# Patient Record
Sex: Female | Born: 1950 | Race: White | Hispanic: No | Smoking: Never smoker
Health system: Southern US, Community
[De-identification: ages and names within clinical notes are randomized; demographics above are authoritative.]

## PROBLEM LIST (undated history)

## (undated) DIAGNOSIS — I10 Essential (primary) hypertension: Secondary | ICD-10-CM

## (undated) DIAGNOSIS — R001 Bradycardia, unspecified: Secondary | ICD-10-CM

## (undated) DIAGNOSIS — I441 Atrioventricular block, second degree: Secondary | ICD-10-CM

## (undated) DIAGNOSIS — I428 Other cardiomyopathies: Secondary | ICD-10-CM

## (undated) DIAGNOSIS — K439 Ventral hernia without obstruction or gangrene: Secondary | ICD-10-CM

## (undated) DIAGNOSIS — G510 Bell's palsy: Secondary | ICD-10-CM

## (undated) DIAGNOSIS — E119 Type 2 diabetes mellitus without complications: Secondary | ICD-10-CM

## (undated) DIAGNOSIS — I2510 Atherosclerotic heart disease of native coronary artery without angina pectoris: Secondary | ICD-10-CM

## (undated) DIAGNOSIS — I5042 Chronic combined systolic (congestive) and diastolic (congestive) heart failure: Secondary | ICD-10-CM

## (undated) HISTORY — DX: Other cardiomyopathies: I42.8

## (undated) HISTORY — DX: Essential (primary) hypertension: I10

## (undated) HISTORY — PX: PACEMAKER INSERTION: SHX728

## (undated) HISTORY — DX: Bell's palsy: G51.0

## (undated) HISTORY — DX: Atherosclerotic heart disease of native coronary artery without angina pectoris: I25.10

## (undated) HISTORY — PX: INSERT / REPLACE / REMOVE PACEMAKER: SUR710

## (undated) HISTORY — PX: HERNIA REPAIR: SHX51

## (undated) HISTORY — DX: Chronic combined systolic (congestive) and diastolic (congestive) heart failure: I50.42

---

## 2006-02-11 ENCOUNTER — Emergency Department: Payer: Self-pay | Admitting: Unknown Physician Specialty

## 2006-02-16 ENCOUNTER — Emergency Department: Payer: Self-pay | Admitting: Emergency Medicine

## 2006-02-20 ENCOUNTER — Emergency Department: Payer: Self-pay | Admitting: Emergency Medicine

## 2013-07-29 ENCOUNTER — Emergency Department: Payer: Self-pay | Admitting: Emergency Medicine

## 2013-07-29 LAB — CBC
HCT: 42.3 % (ref 35.0–47.0)
HGB: 14.4 g/dL (ref 12.0–16.0)
MCH: 28.6 pg (ref 26.0–34.0)
MCHC: 34 g/dL (ref 32.0–36.0)
Platelet: 156 10*3/uL (ref 150–440)
RDW: 13.2 % (ref 11.5–14.5)
WBC: 9.8 10*3/uL (ref 3.6–11.0)

## 2013-07-29 LAB — URINALYSIS, COMPLETE
Bilirubin,UR: NEGATIVE
Leukocyte Esterase: NEGATIVE
Nitrite: NEGATIVE
RBC,UR: 12549 /HPF (ref 0–5)
Specific Gravity: 1.036 (ref 1.003–1.030)
WBC UR: 1371 /HPF (ref 0–5)

## 2013-07-29 LAB — BASIC METABOLIC PANEL
BUN: 13 mg/dL (ref 7–18)
Chloride: 103 mmol/L (ref 98–107)
Co2: 28 mmol/L (ref 21–32)
Creatinine: 0.45 mg/dL — ABNORMAL LOW (ref 0.60–1.30)
EGFR (African American): >60
EGFR (Non-African Amer.): >60
Glucose: 341 mg/dL — ABNORMAL HIGH (ref 65–99)
Osmolality: 286 (ref 275–301)
Potassium: 3.6 mmol/L (ref 3.5–5.1)
Sodium: 136 mmol/L (ref 136–145)

## 2013-07-31 LAB — URINE CULTURE

## 2013-11-11 ENCOUNTER — Ambulatory Visit: Payer: Self-pay | Admitting: Orthopedic Surgery

## 2014-02-24 LAB — CBC WITH DIFFERENTIAL/PLATELET
BASOS PCT: 0.3 %
Basophil #: 0 10*3/uL (ref 0.0–0.1)
EOS ABS: 0 10*3/uL (ref 0.0–0.7)
EOS PCT: 0.1 %
HCT: 48.2 % — AB (ref 35.0–47.0)
HGB: 15.6 g/dL (ref 12.0–16.0)
LYMPHS PCT: 4.8 %
Lymphocyte #: 0.7 10*3/uL — ABNORMAL LOW (ref 1.0–3.6)
MCH: 28.2 pg (ref 26.0–34.0)
MCHC: 32.3 g/dL (ref 32.0–36.0)
MCV: 87 fL (ref 80–100)
Monocyte #: 0.4 x10 3/mm (ref 0.2–0.9)
Monocyte %: 2.9 %
NEUTROS ABS: 14.3 10*3/uL — AB (ref 1.4–6.5)
Neutrophil %: 91.9 %
Platelet: 190 10*3/uL (ref 150–440)
RBC: 5.52 10*6/uL — ABNORMAL HIGH (ref 3.80–5.20)
RDW: 13.5 % (ref 11.5–14.5)
WBC: 15.5 10*3/uL — ABNORMAL HIGH (ref 3.6–11.0)

## 2014-02-24 LAB — COMPREHENSIVE METABOLIC PANEL
Albumin: 4.1 g/dL (ref 3.4–5.0)
Alkaline Phosphatase: 92 U/L
Anion Gap: 24 — ABNORMAL HIGH (ref 7–16)
BILIRUBIN TOTAL: 1.8 mg/dL — AB (ref 0.2–1.0)
BUN: 55 mg/dL — AB (ref 7–18)
CALCIUM: 9.3 mg/dL (ref 8.5–10.1)
Chloride: 88 mmol/L — ABNORMAL LOW (ref 98–107)
Co2: 21 mmol/L (ref 21–32)
Creatinine: 1.26 mg/dL (ref 0.60–1.30)
EGFR (African American): 53 — ABNORMAL LOW
EGFR (Non-African Amer.): 46 — ABNORMAL LOW
Glucose: 546 mg/dL (ref 65–99)
Osmolality: 306 (ref 275–301)
Potassium: 4.1 mmol/L (ref 3.5–5.1)
SGOT(AST): 34 U/L (ref 15–37)
SGPT (ALT): 21 U/L (ref 12–78)
SODIUM: 133 mmol/L — AB (ref 136–145)
Total Protein: 8.3 g/dL — ABNORMAL HIGH (ref 6.4–8.2)

## 2014-02-25 ENCOUNTER — Inpatient Hospital Stay: Payer: Self-pay | Admitting: Internal Medicine

## 2014-02-25 LAB — HEMOGLOBIN A1C: HEMOGLOBIN A1C: 9.7 % — AB (ref 4.2–6.3)

## 2014-02-25 LAB — BASIC METABOLIC PANEL
ANION GAP: 10 (ref 7–16)
ANION GAP: 7 (ref 7–16)
Anion Gap: 22 — ABNORMAL HIGH (ref 7–16)
BUN: 53 mg/dL — ABNORMAL HIGH (ref 7–18)
BUN: 51 mg/dL — AB (ref 7–18)
BUN: 48 mg/dL — AB (ref 7–18)
CALCIUM: 8.5 mg/dL (ref 8.5–10.1)
CALCIUM: 8.5 mg/dL (ref 8.5–10.1)
CHLORIDE: 95 mmol/L — AB (ref 98–107)
CHLORIDE: 103 mmol/L (ref 98–107)
CO2: 31 mmol/L (ref 21–32)
CREATININE: 1.23 mg/dL (ref 0.60–1.30)
CREATININE: 1.34 mg/dL — AB (ref 0.60–1.30)
CREATININE: 1.17 mg/dL (ref 0.60–1.30)
Calcium, Total: 8.1 mg/dL — ABNORMAL LOW (ref 8.5–10.1)
Chloride: 101 mmol/L (ref 98–107)
Co2: 24 mmol/L (ref 21–32)
Co2: 31 mmol/L (ref 21–32)
EGFR (African American): 49 — ABNORMAL LOW
EGFR (African American): 58 — ABNORMAL LOW
EGFR (Non-African Amer.): 50 — ABNORMAL LOW
GFR CALC AF AMER: 55 — AB
GFR CALC NON AF AMER: 47 — AB
GFR CALC NON AF AMER: 43 — AB
GLUCOSE: 255 mg/dL — AB (ref 65–99)
Glucose: 473 mg/dL — ABNORMAL HIGH (ref 65–99)
Glucose: 268 mg/dL — ABNORMAL HIGH (ref 65–99)
Osmolality: 316 (ref 275–301)
Osmolality: 310 (ref 275–301)
Osmolality: 299 (ref 275–301)
POTASSIUM: 2.7 mmol/L — AB (ref 3.5–5.1)
Potassium: 2.7 mmol/L — ABNORMAL LOW (ref 3.5–5.1)
Potassium: 2.5 mmol/L — CL (ref 3.5–5.1)
Sodium: 141 mmol/L (ref 136–145)
Sodium: 144 mmol/L (ref 136–145)
Sodium: 139 mmol/L (ref 136–145)

## 2014-02-25 LAB — POTASSIUM: POTASSIUM: 2.9 mmol/L — AB (ref 3.5–5.1)

## 2014-02-25 LAB — URINALYSIS, COMPLETE
Bilirubin,UR: NEGATIVE
Blood: NEGATIVE
Glucose,UR: >=500 mg/dL (ref 0–75)
Nitrite: NEGATIVE
PROTEIN: NEGATIVE
Ph: 5 (ref 4.5–8.0)
RBC,UR: 8 /HPF (ref 0–5)
Specific Gravity: 1.021 (ref 1.003–1.030)
WBC UR: 196 /HPF (ref 0–5)

## 2014-02-26 LAB — BASIC METABOLIC PANEL
Anion Gap: 5 — ABNORMAL LOW (ref 7–16)
BUN: 36 mg/dL — AB (ref 7–18)
CO2: 33 mmol/L — AB (ref 21–32)
CREATININE: 0.71 mg/dL (ref 0.60–1.30)
Calcium, Total: 8.5 mg/dL (ref 8.5–10.1)
Chloride: 105 mmol/L (ref 98–107)
EGFR (African American): >60
EGFR (Non-African Amer.): >60
Glucose: 112 mg/dL — ABNORMAL HIGH (ref 65–99)
OSMOLALITY: 294 (ref 275–301)
Potassium: 3.6 mmol/L (ref 3.5–5.1)
Sodium: 143 mmol/L (ref 136–145)

## 2014-02-26 LAB — CBC WITH DIFFERENTIAL/PLATELET
Basophil #: 0.1 10*3/uL (ref 0.0–0.1)
Basophil %: 0.5 %
Eosinophil #: 0 10*3/uL (ref 0.0–0.7)
Eosinophil %: 0 %
HCT: 44.7 % (ref 35.0–47.0)
HGB: 14.8 g/dL (ref 12.0–16.0)
LYMPHS ABS: 1.1 10*3/uL (ref 1.0–3.6)
Lymphocyte %: 8.4 %
MCH: 28.5 pg (ref 26.0–34.0)
MCHC: 33.2 g/dL (ref 32.0–36.0)
MCV: 86 fL (ref 80–100)
MONO ABS: 0.8 x10 3/mm (ref 0.2–0.9)
MONOS PCT: 6.6 %
NEUTROS ABS: 10.7 10*3/uL — AB (ref 1.4–6.5)
NEUTROS PCT: 84.5 %
PLATELETS: 135 10*3/uL — AB (ref 150–440)
RBC: 5.2 10*6/uL (ref 3.80–5.20)
RDW: 13.3 % (ref 11.5–14.5)
WBC: 12.7 10*3/uL — ABNORMAL HIGH (ref 3.6–11.0)

## 2014-02-26 LAB — URINE CULTURE

## 2014-02-27 LAB — BASIC METABOLIC PANEL
Anion Gap: 9 (ref 7–16)
BUN: 31 mg/dL — AB (ref 7–18)
CALCIUM: 8.4 mg/dL — AB (ref 8.5–10.1)
Chloride: 104 mmol/L (ref 98–107)
Co2: 32 mmol/L (ref 21–32)
Creatinine: 0.64 mg/dL (ref 0.60–1.30)
EGFR (African American): >60
EGFR (Non-African Amer.): >60
GLUCOSE: 129 mg/dL — AB (ref 65–99)
OSMOLALITY: 297 (ref 275–301)
Potassium: 3.4 mmol/L — ABNORMAL LOW (ref 3.5–5.1)
Sodium: 145 mmol/L (ref 136–145)

## 2014-02-27 LAB — CBC WITH DIFFERENTIAL/PLATELET
BASOS ABS: 0 10*3/uL (ref 0.0–0.1)
BASOS PCT: 0.2 %
EOS ABS: 0 10*3/uL (ref 0.0–0.7)
EOS PCT: 0 %
HCT: 49.5 % — ABNORMAL HIGH (ref 35.0–47.0)
HGB: 16.3 g/dL — AB (ref 12.0–16.0)
Lymphocyte #: 0.6 10*3/uL — ABNORMAL LOW (ref 1.0–3.6)
Lymphocyte %: 5.8 %
MCH: 28.5 pg (ref 26.0–34.0)
MCHC: 32.9 g/dL (ref 32.0–36.0)
MCV: 87 fL (ref 80–100)
Monocyte #: 0.7 x10 3/mm (ref 0.2–0.9)
Monocyte %: 6.9 %
Neutrophil #: 8.2 10*3/uL — ABNORMAL HIGH (ref 1.4–6.5)
Neutrophil %: 87.1 %
PLATELETS: 124 10*3/uL — AB (ref 150–440)
RBC: 5.69 10*6/uL — ABNORMAL HIGH (ref 3.80–5.20)
RDW: 13.4 % (ref 11.5–14.5)
WBC: 9.4 10*3/uL (ref 3.6–11.0)

## 2014-02-28 LAB — COMPREHENSIVE METABOLIC PANEL
ALBUMIN: 2.3 g/dL — AB (ref 3.4–5.0)
ALK PHOS: 48 U/L
Anion Gap: 8 (ref 7–16)
BUN: 34 mg/dL — ABNORMAL HIGH (ref 7–18)
Bilirubin,Total: 0.8 mg/dL (ref 0.2–1.0)
Calcium, Total: 7.7 mg/dL — ABNORMAL LOW (ref 8.5–10.1)
Chloride: 104 mmol/L (ref 98–107)
Co2: 34 mmol/L — ABNORMAL HIGH (ref 21–32)
Creatinine: 0.65 mg/dL (ref 0.60–1.30)
EGFR (Non-African Amer.): >60
Glucose: 172 mg/dL — ABNORMAL HIGH (ref 65–99)
Osmolality: 302 (ref 275–301)
Potassium: 3.5 mmol/L (ref 3.5–5.1)
SGOT(AST): 122 U/L — ABNORMAL HIGH (ref 15–37)
SGPT (ALT): 121 U/L — ABNORMAL HIGH (ref 12–78)
Sodium: 146 mmol/L — ABNORMAL HIGH (ref 136–145)
TOTAL PROTEIN: 5 g/dL — AB (ref 6.4–8.2)

## 2014-02-28 LAB — CBC WITH DIFFERENTIAL/PLATELET
BASOS ABS: 0 10*3/uL (ref 0.0–0.1)
Basophil %: 0.1 %
EOS ABS: 0 10*3/uL (ref 0.0–0.7)
Eosinophil %: 0 %
HCT: 43.3 % (ref 35.0–47.0)
HGB: 14 g/dL (ref 12.0–16.0)
Lymphocyte #: 0.5 10*3/uL — ABNORMAL LOW (ref 1.0–3.6)
Lymphocyte %: 5.1 %
MCH: 28.6 pg (ref 26.0–34.0)
MCHC: 32.4 g/dL (ref 32.0–36.0)
MCV: 88 fL (ref 80–100)
Monocyte #: 0.6 x10 3/mm (ref 0.2–0.9)
Monocyte %: 6.3 %
Neutrophil #: 8.5 10*3/uL — ABNORMAL HIGH (ref 1.4–6.5)
Neutrophil %: 88.5 %
PLATELETS: 122 10*3/uL — AB (ref 150–440)
RBC: 4.9 10*6/uL (ref 3.80–5.20)
RDW: 13.2 % (ref 11.5–14.5)
WBC: 9.7 10*3/uL (ref 3.6–11.0)

## 2014-03-01 LAB — CBC WITH DIFFERENTIAL/PLATELET
BASOS ABS: 0 10*3/uL (ref 0.0–0.1)
BASOS PCT: 0.1 %
EOS ABS: 0 10*3/uL (ref 0.0–0.7)
EOS PCT: 0 %
HCT: 41.2 % (ref 35.0–47.0)
HGB: 13.2 g/dL (ref 12.0–16.0)
LYMPHS ABS: 0.7 10*3/uL — AB (ref 1.0–3.6)
LYMPHS PCT: 6.3 %
MCH: 28.6 pg (ref 26.0–34.0)
MCHC: 32.1 g/dL (ref 32.0–36.0)
MCV: 89 fL (ref 80–100)
MONOS PCT: 6.3 %
Monocyte #: 0.7 x10 3/mm (ref 0.2–0.9)
NEUTROS ABS: 9.3 10*3/uL — AB (ref 1.4–6.5)
Neutrophil %: 87.3 %
Platelet: 124 10*3/uL — ABNORMAL LOW (ref 150–440)
RBC: 4.62 10*6/uL (ref 3.80–5.20)
RDW: 13.7 % (ref 11.5–14.5)
WBC: 10.7 10*3/uL (ref 3.6–11.0)

## 2014-03-01 LAB — BASIC METABOLIC PANEL
Anion Gap: 10 (ref 7–16)
BUN: 41 mg/dL — AB (ref 7–18)
CALCIUM: 7.9 mg/dL — AB (ref 8.5–10.1)
Chloride: 105 mmol/L (ref 98–107)
Co2: 28 mmol/L (ref 21–32)
Creatinine: 0.7 mg/dL (ref 0.60–1.30)
EGFR (Non-African Amer.): >60
Glucose: 144 mg/dL — ABNORMAL HIGH (ref 65–99)
Osmolality: 298 (ref 275–301)
Potassium: 3.6 mmol/L (ref 3.5–5.1)
Sodium: 143 mmol/L (ref 136–145)

## 2014-03-02 LAB — CBC WITH DIFFERENTIAL/PLATELET
BASOS ABS: 0 10*3/uL (ref 0.0–0.1)
BASOS PCT: 0.5 %
EOS ABS: 0 10*3/uL (ref 0.0–0.7)
Eosinophil %: 0 %
HCT: 36 % (ref 35.0–47.0)
HGB: 11.9 g/dL — ABNORMAL LOW (ref 12.0–16.0)
LYMPHS ABS: 0.7 10*3/uL — AB (ref 1.0–3.6)
LYMPHS PCT: 9 %
MCH: 28.9 pg (ref 26.0–34.0)
MCHC: 33 g/dL (ref 32.0–36.0)
MCV: 88 fL (ref 80–100)
MONO ABS: 0.8 x10 3/mm (ref 0.2–0.9)
Monocyte %: 10.7 %
Neutrophil #: 5.9 10*3/uL (ref 1.4–6.5)
Neutrophil %: 79.8 %
Platelet: 120 10*3/uL — ABNORMAL LOW (ref 150–440)
RBC: 4.11 10*6/uL (ref 3.80–5.20)
RDW: 13.4 % (ref 11.5–14.5)
WBC: 7.4 10*3/uL (ref 3.6–11.0)

## 2014-03-02 LAB — BASIC METABOLIC PANEL
ANION GAP: 6 — AB (ref 7–16)
BUN: 23 mg/dL — ABNORMAL HIGH (ref 7–18)
CREATININE: 0.56 mg/dL — AB (ref 0.60–1.30)
Calcium, Total: 7.8 mg/dL — ABNORMAL LOW (ref 8.5–10.1)
Chloride: 107 mmol/L (ref 98–107)
Co2: 30 mmol/L (ref 21–32)
EGFR (African American): >60
EGFR (Non-African Amer.): >60
GLUCOSE: 197 mg/dL — AB (ref 65–99)
Osmolality: 294 (ref 275–301)
POTASSIUM: 3.2 mmol/L — AB (ref 3.5–5.1)
SODIUM: 143 mmol/L (ref 136–145)

## 2014-03-02 LAB — PHOSPHORUS: PHOSPHORUS: 1.2 mg/dL — AB (ref 2.5–4.9)

## 2014-03-02 LAB — MAGNESIUM: Magnesium: 2 mg/dL

## 2014-03-03 LAB — BASIC METABOLIC PANEL
Anion Gap: 5 — ABNORMAL LOW (ref 7–16)
BUN: 12 mg/dL (ref 7–18)
CALCIUM: 7.3 mg/dL — AB (ref 8.5–10.1)
CREATININE: 0.49 mg/dL — AB (ref 0.60–1.30)
Chloride: 104 mmol/L (ref 98–107)
Co2: 31 mmol/L (ref 21–32)
EGFR (Non-African Amer.): >60
Glucose: 227 mg/dL — ABNORMAL HIGH (ref 65–99)
Osmolality: 286 (ref 275–301)
Potassium: 3.1 mmol/L — ABNORMAL LOW (ref 3.5–5.1)
SODIUM: 140 mmol/L (ref 136–145)

## 2014-03-03 LAB — CBC WITH DIFFERENTIAL/PLATELET
BASOS PCT: 0.1 %
Basophil #: 0 10*3/uL (ref 0.0–0.1)
Eosinophil #: 0 10*3/uL (ref 0.0–0.7)
Eosinophil %: 0 %
HCT: 34.5 % — AB (ref 35.0–47.0)
HGB: 11.8 g/dL — ABNORMAL LOW (ref 12.0–16.0)
LYMPHS ABS: 0.9 10*3/uL — AB (ref 1.0–3.6)
Lymphocyte %: 13.3 %
MCH: 29.4 pg (ref 26.0–34.0)
MCHC: 34.2 g/dL (ref 32.0–36.0)
MCV: 86 fL (ref 80–100)
Monocyte #: 0.8 x10 3/mm (ref 0.2–0.9)
Monocyte %: 11.9 %
NEUTROS ABS: 5 10*3/uL (ref 1.4–6.5)
NEUTROS PCT: 74.7 %
Platelet: 128 10*3/uL — ABNORMAL LOW (ref 150–440)
RBC: 4.02 10*6/uL (ref 3.80–5.20)
RDW: 13.1 % (ref 11.5–14.5)
WBC: 6.7 10*3/uL (ref 3.6–11.0)

## 2014-03-03 LAB — PATHOLOGY REPORT

## 2014-03-04 LAB — BASIC METABOLIC PANEL
ANION GAP: 5 — AB (ref 7–16)
BUN: 8 mg/dL (ref 7–18)
Calcium, Total: 7.6 mg/dL — ABNORMAL LOW (ref 8.5–10.1)
Chloride: 98 mmol/L (ref 98–107)
Co2: 34 mmol/L — ABNORMAL HIGH (ref 21–32)
Creatinine: 0.33 mg/dL — ABNORMAL LOW (ref 0.60–1.30)
EGFR (Non-African Amer.): >60
Glucose: 157 mg/dL — ABNORMAL HIGH (ref 65–99)
Osmolality: 275 (ref 275–301)
POTASSIUM: 2.9 mmol/L — AB (ref 3.5–5.1)
Sodium: 137 mmol/L (ref 136–145)

## 2014-03-04 LAB — PHOSPHORUS: PHOSPHORUS: 2 mg/dL — AB (ref 2.5–4.9)

## 2014-03-04 LAB — MAGNESIUM: Magnesium: 1.4 mg/dL — ABNORMAL LOW

## 2014-03-05 LAB — BASIC METABOLIC PANEL
Anion Gap: 6 — ABNORMAL LOW (ref 7–16)
BUN: 7 mg/dL (ref 7–18)
CHLORIDE: 99 mmol/L (ref 98–107)
CO2: 31 mmol/L (ref 21–32)
CREATININE: 0.37 mg/dL — AB (ref 0.60–1.30)
Calcium, Total: 7.6 mg/dL — ABNORMAL LOW (ref 8.5–10.1)
EGFR (Non-African Amer.): >60
GLUCOSE: 197 mg/dL — AB (ref 65–99)
OSMOLALITY: 275 (ref 275–301)
POTASSIUM: 3.6 mmol/L (ref 3.5–5.1)
SODIUM: 136 mmol/L (ref 136–145)

## 2014-03-05 LAB — MAGNESIUM: MAGNESIUM: 1.9 mg/dL

## 2014-03-06 LAB — PLATELET COUNT: PLATELETS: 205 10*3/uL (ref 150–440)

## 2014-06-30 ENCOUNTER — Emergency Department: Payer: Self-pay | Admitting: Emergency Medicine

## 2014-06-30 LAB — CBC
HCT: 38.2 % (ref 35.0–47.0)
HGB: 12.5 g/dL (ref 12.0–16.0)
MCH: 27.1 pg (ref 26.0–34.0)
MCHC: 32.6 g/dL (ref 32.0–36.0)
MCV: 83 fL (ref 80–100)
Platelet: 184 10*3/uL (ref 150–440)
RBC: 4.61 10*6/uL (ref 3.80–5.20)
RDW: 13 % (ref 11.5–14.5)
WBC: 8 10*3/uL (ref 3.6–11.0)

## 2014-06-30 LAB — BASIC METABOLIC PANEL
Anion Gap: 7 (ref 7–16)
BUN: 20 mg/dL — ABNORMAL HIGH (ref 7–18)
CALCIUM: 8.6 mg/dL (ref 8.5–10.1)
Chloride: 101 mmol/L (ref 98–107)
Co2: 28 mmol/L (ref 21–32)
Creatinine: 0.74 mg/dL (ref 0.60–1.30)
EGFR (African American): >60
EGFR (Non-African Amer.): >60
Glucose: 453 mg/dL — ABNORMAL HIGH (ref 65–99)
Osmolality: 294 (ref 275–301)
Potassium: 3.9 mmol/L (ref 3.5–5.1)
Sodium: 136 mmol/L (ref 136–145)

## 2014-12-04 NOTE — Consult Note (Signed)
PATIENT NAME:  Bethany Gillespie, Bethany Gillespie MR#:  [id number] DATE OF BIRTH:  [birth year]  DATE OF CONSULTATION:  02/25/2014  REFERRING PHYSICIAN:   CONSULTING PHYSICIAN:  Lollie Sails, MD  A patient of Dr. Dustin Flock.  REASON FOR CONSULTATION: Blood in the emesis.   HISTORY OF PRESENT ILLNESS: [name] is a 64 year old Caucasian female, who was admitted on July 16 for nausea and vomiting. She was found to have a hyperosmolar nonketotic state with her associated diabetes. She states that her problems began on Tuesday with some diarrhea. This was then followed with nausea and vomiting. She came to the Emergency Room on Wednesday night. She states that the diarrhea has decreased and improved; however, whenever she tries to eat or drink anything, she has been throwing up, this being brownish/coffee-ground watery-colored material. She states that she has been using insulin for at least 2 years. She denies any history of peptic ulcer disease or history of nonsteroidal anti-inflammatory medication use. She takes no prescriptions for pain. She has never had a colonoscopy. Her hemoglobin has been stable during the hospitalization, indeed increasing. Her BUN has been improving. She has had weight loss for at least several months.   PAST MEDICAL HISTORY: Type 2 insulin requiring diabetes mellitus and hypertension. She denies any abdominal surgery.   REVIEW OF SYSTEMS: 10 systems reviewed per admission history and physical, agree with same.   PHYSICAL EXAMINATION:  VITAL SIGNS: Temperature 97.6, pulse 130 to 120, respirations 18, blood pressure 119/86, pulse oximetry 94%.  GENERAL: She is a 64 year old, Caucasian female. She is spitting small amounts of brown liquid material into an emesis bag during the interview.  HEENT:  Normocephalic, atraumatic. Eyes are anicteric.  HEART: Regular rate and rhythm.  LUNGS: Clear.  ABDOMEN: Distended in the upper abdomen with a firm masslike region in the left upper  quadrant. She is tender to palpation in this area. The lower abdomen is minimally tender, but nondistended. Bowel sounds are positive, but distant. There is no rebound.  EXTREMITIES: No clubbing, cyanosis, or edema.  NEUROLOGICAL: Cranial nerves II through XII grossly intact. Muscle strength bilaterally equal and symmetric. RECTAL: Anorectal examination deferred.   LABORATORY DATA: This morning's laboratories include a glucose of 129, BUN 31, creatinine 0.64, sodium 145, potassium 3.4, chloride 104, bicarbonate 32, calcium 8.4. Hemogram showing a white count of 9.4, hemoglobin and hematocrit of 16.3/49.5, platelet count of 124,000.   She did have an imaging study this afternoon, this being a CT scan of the abdomen and pelvis with contrast for abdominal pain, nausea, vomiting, and leukocytosis. This showed a marked gastric distention due to a large hiatal hernia with postbulbar duodenal obstruction at the diaphragmatic hiatus. Gastric volvulus could not be excluded and a general surgical consultation was recommended. There is a 1.6 cm indeterminate left adrenal mass. Small bibasilar pleural effusion and bibasilar atelectasis.   ASSESSMENT: Nausea and vomiting, coffee-ground emesis. CT scan and physical examination concerning for a gastric volvulus with duodenal obstruction, and much of the stomach above the diaphragmatic hiatus.   RECOMMENDATIONS: Agree with surgical consultation as recommended in radiology/CT report.   We will discuss further with primary doctor.   ____________________________ Lollie Sails, MD mus:jr D: 02/27/2014 16:33:21 ET T: 02/27/2014 16:55:08 ET JOB#: [id number]  cc: Lollie Sails, MD, <Dictator> Lollie Sails MD ELECTRONICALLY SIGNED 03/11/2014 17:45

## 2014-12-04 NOTE — Op Note (Signed)
PATIENT NAME:  Bethany Gillespie, Bethany Gillespie MR#:  [id number] DATE OF BIRTH:  1950/12/05  DATE OF PROCEDURE:  02/27/2014  PREOPERATIVE DIAGNOSIS: Incarcerated paraesophageal hernia.   POSTOPERATIVE DIAGNOSIS: Incarcerated paraesophageal hernia.  PROCEDURE PERFORMED: 1.  Open paraesophageal hernia repair with biological mesh including Nissen fundoplication and insertion of gastrojejunostomy.  2.  Central line placement, right subclavian vein.   SURGEON: Hanad Leino A. Marina Gravel, MD FACS  ASSISTANT: Consuela Mimes, MD FACS  TYPE OF ANESTHESIA: General endotracheal.   FINDINGS: Nearly half the stomach was incarcerated in a paraesophageal hernia with necrotic and ischemic epiploic fat on the stomach greater curvature.   SPECIMENS: Fat as described above.   ESTIMATED BLOOD LOSS: Minimal.   DRAINS: GJ tube to gravity.   DESCRIPTION OF PROCEDURE: With informed consent in the supine position, general oral endotracheal anesthesia was induced. The patient's abdomen was widely clipped of hair, prepped and draped utilizing ChloraPrep solution. Timeout was observed. A midline skin incision was fashioned from the xiphoid to just above the umbilicus. A self-retaining abdominal retractor was placed. The falciform ligament was divided with the LigaSure apparatus. The left triangular hepatic ligament was taken down with electrocautery. The stomach was reduced with traction caudally with Babcock clamps. A large amount of gastric contents was drained via an orogastric tube placed by anesthesia.   The overlying hernia sac along the phrenoesophageal ligament was divided utilizing electrocautery. The left and right crura were skeletonized. Utilizing [employer] from [place], a reinforced repair was utilized. Prior to this, a 60-French bougie catheter was placed by anesthesia down into the stomach. The crural repair was accomplished with interrupted mattress-type sutures of #2-0 Prolene sewn overlying individual strips of Bio-A  material along both the right and left crura. Short gastrics were then divided utilizing electrocautery and oversewn with 2-0 silk suture. A window was fashioned behind the gastroesophageal junction which lay within the abdominal cavity at this point, and a Nissen fundoplication was accomplished utilizing #0 silk suture x 3. Two of the bites incorporated a small amount of serosa of the esophagus at the gastro-esophageal junction.   With this being accomplished, the gastrojejunostomy feeding tube was inserted in the standard stab fashion with concentric pursestring sutures of #0 silk suture.  Site was chosen on the anterior abdominal wall to accomplish gastropexy. Tube was inserted through the anterior abdominal wall and directed into the stomach with the G portion directed into the proximal jejunum through the pylorus and duodenum. Balloon was inflated. The catheter was then secured to the anterior abdominal wall utilizing silk suture. The abdomen was then irrigated and aspirated dry and hemostasis appeared to be excellent on the operative field. The wound was then closed between the extremes of the wound with a running #1 PDS suture. Skin edges were reapproximated utilizing a skin stapler and the flange was connected to the skin utilizing nylon suture. It was placed into a gravity bag. Sterile dressing was applied. The patient was subsequently extubated and taken to the recovery room in stable and satisfactory condition.   At the beginning of the case, a right subclavian vein catheter was placed under sterile technique utilizing sterile Seldinger technique on first pass in the subclavian vein.    ____________________________ Jeannette How. Marina Gravel, MD mab:sk D: 02/28/2014 17:46:00 ET T: 02/28/2014 23:57:13 ET JOB#: [id number]  cc: Elta Guadeloupe A. Marina Gravel, MD, <Dictator> Ahlivia Salahuddin A Brigetta Beckstrom MD ELECTRONICALLY SIGNED 03/03/2014 11:09

## 2014-12-04 NOTE — Consult Note (Signed)
Chief Complaint:  Subjective/Chief Complaint seen for coffeeground emesis.  patietn went to surgery yesterday for incarcerated hiatal hernia, tolerated well.   VITAL SIGNS/ANCILLARY NOTES: **Vital Signs.:   19-Jul-15 12:29  Vital Signs Type Q 4hr  Temperature Temperature (F) 98.3  Celsius 36.8  Pulse Pulse 104  Respirations Respirations 17  Systolic BP Systolic BP 678  Diastolic BP (mmHg) Diastolic BP (mmHg) 75  Mean BP 90  Pulse Ox % Pulse Ox % 92  Pulse Ox Activity Level  At rest  Oxygen Delivery 2L   Brief Assessment:  Cardiac Regular   Respiratory occasional wheeze   Gastrointestinal details normal appropriately tender, positive but distant bowel sounds.  mild to moderate distension, surgical  wound dressed.   Lab Results: Hepatic:  19-Jul-15 04:27   Bilirubin, Total 0.8  Alkaline Phosphatase 48 (45-117 NOTE: New Reference Range 07/03/13)  SGPT (ALT)  121  SGOT (AST)  122  Total Protein, Serum  5.0  Albumin, Serum  2.3  Routine Chem:  19-Jul-15 04:27   Glucose, Serum  172  BUN  34  Creatinine (comp) 0.65  Sodium, Serum  146  Potassium, Serum 3.5  Chloride, Serum 104  CO2, Serum  34  Calcium (Total), Serum  7.7  Osmolality (calc) 302  eGFR (African American) >60  eGFR (Non-African American) >60 (eGFR values <84mL/min/1.73 m2 may be an indication of chronic kidney disease (CKD). Calculated eGFR is useful in patients with stable renal function. The eGFR calculation will not be reliable in acutely ill patients when serum creatinine is changing rapidly. It is not useful in  patients on dialysis. The eGFR calculation may not be applicable to patients at the low and high extremes of body sizes, pregnant women, and vegetarians.)  Result Comment LABS - This specimen was collected through an   - indwelling catheter or arterial line.  - A minimum of 25mls of blood was wasted prior    - to collecting the sample.  Interpret  - results with caution.  Result(s)  reported on 28 Feb 2014 at 04:46AM.  Anion Gap 8  Routine Hem:  19-Jul-15 04:27   WBC (CBC) 9.7  RBC (CBC) 4.90  Hemoglobin (CBC) 14.0  Hematocrit (CBC) 43.3  Platelet Count (CBC)  122  MCV 88  MCH 28.6  MCHC 32.4  RDW 13.2  Neutrophil % 88.5  Lymphocyte % 5.1  Monocyte % 6.3  Eosinophil % 0.0  Basophil % 0.1  Neutrophil #  8.5  Lymphocyte #  0.5  Monocyte # 0.6  Eosinophil # 0.0  Basophil # 0.0 (Result(s) reported on 28 Feb 2014 at 04:40AM.)   Assessment/Plan:  Assessment/Plan:  Assessment 1) IDDM2, admitted with hyperomolar/non-ketotic.  resolved 2) incarcerated hiatal hernia. s/p surgery with ex lap, repair hiatal hernia, g-tube central line. doing better.   Plan 1) will start iv ppi, change to enteral when possible. continue as o/p.  will follow at a distance.   Electronic Signatures: Loistine Simas (MD)  (Signed 19-Jul-15 15:17)  Authored: Chief Complaint, VITAL SIGNS/ANCILLARY NOTES, Brief Assessment, Lab Results, Assessment/Plan   Last Updated: 19-Jul-15 15:17 by Loistine Simas (MD)

## 2014-12-04 NOTE — Discharge Summary (Signed)
PATIENT NAME:  Bethany Gillespie, Bethany Gillespie MR#:  [id number] DATE OF BIRTH:  [birth year]  DATE OF ADMISSION:  02/25/2014 DATE OF DISCHARGE:  03/08/2014  DISCHARGE DIAGNOSES:  1.  Incarcerated hiatal hernia status post exploratory laparoscopy, gastrostomy tube placement.  2.  Hyperosmolar nonketotic state. 3.  History of type 2 diabetes mellitus. 4.  Hypokalemia.  5.  Generalized deconditioning.   DISCHARGE MEDICATIONS:  1.  Lisinopril 20 mg p.o. daily. 2.  Lantus 18 units at bedtime.  3.   augmentin 1 tablet p.o. b.i.d. for 14 days.   CONSULTATIONS: Surgical consult with Dr. Marina Gravel and endocrinology consult with Dr. Lavone Orn. Physical therapy consult. The patient also seen by GI.   HOSPITAL COURSE: The patient is a 64 year old white female hospitalized on the 16th because of nausea, vomiting, diarrhea, and the patient noted to have elevated blood sugars and the worst 546, and she was started on insulin drip for hyperosmolar nonketotic state along with IV fluids. The patient continued to have nausea, vomiting, and underwent a CAT scan of the abdomen and pelvis which showed a large hiatal hernia with post bulbar duodenal obstruction. The patient was seen by surgery, and he had exploratory laparotomy and repair of hiatal hernia on July 18 and G tube was placed. The patient was continued on G tube feedings for a while and then started on a clear liquid diet on Saturday, and advanced that diet slowly. Today, she is on a solid diet and G tube feedings were stopped, and the patient was seen by Dr. Leanora Cover. Her incision area was examined and there was some pus around the incision area. So, that was drained and Dr. Leanora Cover started her on Augmentin, and patient will go home with Augmentin 1 tablet twice daily for 2 weeks. The patient will see Dr. Leanora Cover for a followup of abdominal wall abscess in 2 to 3 days at [facility] surgical. Patient discharged to home with home health, physical therapy, and RN. The patient right  now is on solid diet and G-tube feedings were stopped.   1.  She will follow up with [facility] surgical group regarding abdominal wall abscess and she needs to continue antibiotics.  2.  Type 2 diabetes mellitus. Patient's sugars started to go up and still on the Levemir. She can continue that.  3.  Deconditioning. The patient seen by physical therapy. They recommended rehabilitation, but she does not want to go. She wants to go home with home health, so will arrange that.  4.  Regarding the abdominal wall abscess, the patient's G-tube balloon had burst and caused some abdominal abscess, and so that area of the skin was drained and patient had a 16-French Foley placed and was started on Augmentin, and Dr. Leanora Cover will see her in the clinic. Every  other staple was removed.    Patient is afebrile today. Temperature 99.4, blood pressure 140/71, saturations 93% on room air. The patient's electrolytes were normal on the 24th and white count was to 6.7 on July 22nd.   Time spent on discharge was more than 30 minutes.  PRIMARY DOCTOR:  The patient has no primary doctor.   ____________________________ Epifanio Lesches, MD sk:am D: 03/08/2014 13:12:31 ET T: 03/08/2014 23:25:01 ET JOB#: [id number]  cc: Epifanio Lesches, MD, <Dictator> A. Lavone Orn, MD Lollie Sails, MD Consuela Mimes, MD   Epifanio Lesches MD ELECTRONICALLY SIGNED 03/25/2014 23:06

## 2014-12-04 NOTE — Consult Note (Signed)
Brief Consult Note: Diagnosis: nausea vomiting coffeeground emesis.   Patient was seen by consultant.   Consult note dictated.   Recommend further assessment or treatment.   Comments: Please see full GI consult [phone].  Patient admitted 2 d ago with hyperosmolar non-ketotic state in the setting of IDDM2.  Paitent continued with n/v after resolution of above and resolution of diarrhea.  Physicla exam shows a mass-like appearance to the left uper quadrant, paucity of normal bowel sounds.  CT showing concern for possible gastric volvulus, intrathoracic portion of stomach and point of possible obstruction in the duodenum.  Recommend surgical consultation.  Electronic Signatures for Addendum Section:  Loistine Simas (MD) (Signed Addendum 18-Jul-15 17:35)  Case discussed with Dr Marina Gravel and Dr Serita Grit.   Electronic Signatures: Loistine Simas (MD)  (Signed 18-Jul-15 16:38)  Authored: Brief Consult Note   Last Updated: 18-Jul-15 17:35 by Loistine Simas (MD)

## 2014-12-04 NOTE — Consult Note (Signed)
Brief Consult Note: Diagnosis: obstructed stomach, massive hiatal hernia.   Patient was seen by consultant.   Recommend to proceed with surgery or procedure.   Orders entered.   Discussed with Attending MD.   Comments: long standing issues with nausea, emesis, odynophagia. needs repair following ngt insertion, will need G-J tube as pexy in addition.  Electronic Signatures: Sherri Rad (MD)  (Signed 18-Jul-15 17:51)  Authored: Brief Consult Note   Last Updated: 18-Jul-15 17:51 by Sherri Rad (MD)

## 2014-12-04 NOTE — H&P (Signed)
PATIENT NAME:  Bethany Gillespie, Bethany Gillespie MR#:  [id number] DATE OF BIRTH:  [birth year]  DATE OF ADMISSION:  02/25/2014  REFERRING PHYSICIAN: Dr. Kerman Passey.   PRIMARY CARE PHYSICIAN: Dr. Dear.    CHIEF COMPLAINT: Nausea and vomiting.   HISTORY OF PRESENT ILLNESS: A 64 year old Caucasian female with history of type 2 diabetes insulin requiring, as well as hypertension, presenting with nausea and vomiting. She describes 3 day duration of nausea, vomiting and diarrhea, nonbloody, nonbilious emesis, watery stools. No fever or chills. No abdominal pain. Denies any sick contacts. Has not taken her insulin for the last 3 days' duration. Now has polyuria, polydipsia. Progressive weakness and fatigue. On arrival, glucose noted to be in the 500s.   REVIEW OF SYSTEMS:   CONSTITUTIONAL: Denies fever; however, positive for fatigue, weakness.  EYES: Denies blurry vision, double vision, eye pain.  EARS, NOSE, THROAT: Denies tinnitus, ear pain, hearing loss.  RESPIRATORY: Denies cough, wheeze, shortness of breath.  CARDIOVASCULAR: Denies chest pain, palpitations, edema.  GASTROINTESTINAL: Positive for nausea, vomiting, diarrhea as described above. Denies any abdominal pain.  GENITOURINARY: Positive for increased urinary frequency. Denies dysuria.  ENDOCRINE: Denies nocturia; however, positive for polydipsia.  HEMATOLOGIC AND LYMPHATIC: Denies easy bruising, bleeding.  SKIN: Denies rash or lesions.  MUSCULOSKELETAL: Denies pain in neck, back, shoulder, knees, hips or arthritic symptoms.  NEUROLOGIC: Denies paralysis, paresthesias.  PSYCHIATRIC: Denies anxiety or depressive symptoms.   Otherwise, full review of systems performed by me is negative.   PAST MEDICAL HISTORY: Type 2 diabetes insulin requiring, hypertension.   SOCIAL HISTORY: Denies alcohol, tobacco or drug usage.   FAMILY HISTORY: Denies any known cardiovascular or pulmonary disorders.   ALLERGIES: BEE STINGS.   HOME MEDICATIONS: Include Levemir  30 units subcutaneous at bedtime, as well as metformin unknown dose b.i.d.,   PHYSICAL EXAMINATION:  VITAL SIGNS: Temperature 98.6, heart rate 123, respirations 18, blood pressure 133/75, saturating 96% on room air. Weight 49 kg, BMI 19.8.  GENERAL: Ill-appearing Caucasian female in minimal distress given nauseousness, vomiting.  HEAD: Normocephalic, atraumatic.  EYES: Pupils equal, round and reactive to light. Extraocular muscles intact. No scleral icterus.  MOUTH: Markedly dry mucosal membranes. Dentition intact. No abscess noted.  EARS, NOSE, THROAT: Clear without exudates. No external lesions.  NECK: Supple. No thyromegaly. No nodules. No JVD.  PULMONARY: Clear to auscultation bilaterally without wheezes, rubs or rhonchi. No use of accessory muscles. Good respiratory effort.  CHEST: Nontender to palpation.  CARDIOVASCULAR: S1, S2, tachycardic. No murmurs, rubs or gallops. No edema. Pedal pulses 2+ bilaterally.  GASTROINTESTINAL: Soft, nontender, nondistended. No masses. Positive bowel sounds. No hepatosplenomegaly.  MUSCULOSKELETAL: No swelling, clubbing or edema. Range of motion full in all extremities.  NEUROLOGIC: Cranial nerves II through XII intact. No gross focal neurologic deficits. Sensation intact. Reflexes intact.  SKIN: No ulceration, lesions, rash, cyanosis. Skin warm, dry. Turgor poor.  PSYCHIATRIC: Mood and affect within normal limits. The patient is awake, alert and oriented x 3. Insight and judgment intact.   LABORATORY DATA: Sodium 133, potassium 4.1, chloride 88, bicarb 21, BUN 55, creatinine 1.26, glucose 546. LFTs: Total protein 8.3, bilirubin 1.8, otherwise within normal limits. WBC 15.5, hemoglobin 15.6, platelets of 190. VBG performed, pH 7.34, CO2 of 48.   ASSESSMENT AND PLAN: A 64 year old Caucasian female with a history of diabetes, hypertension, presenting with nausea and vomiting.  1. Hyperosmolar, nonketotic state: Initiate insulin drip at 0.1 units/kg per hour.  Intravenous fluid hydration normal saline and 20 mEq of potassium at 200 mL an  hour. When glucose less than 300, change intravenous fluids to D5 half normal saline and 20 mEq of potassium at 150 mL an hour. Decrease insulin drip to 0.05 units/kg per hour. Trend BMPs q.4 hours as well as glucose q.1 hour. When she is able to tolerate p.o. and glucose is well controlled, initiate Levemir at 30 units subcutaneous and then stop insulin drip 30 to 60 minutes afterwards.  2. Hypertension: Unknown dose of medication. Can add p.r.n. hydralazine if required for systolic greater than [id number] or diastolic greater than [id number] until she brings in her medication list.  3. Leukocytosis: Likely related to viral gastroenteritis. No indication for antibiotics at this point.  4. Venous thromboembolism prophylaxis with heparin subcutaneous.   The patient is FULL CODE.   CRITICAL CARE TIME SPENT: 45 minutes.   ____________________________ Aaron Mose. Princetta Uplinger, MD dkh:gb D: 02/25/2014 00:54:20 ET T: 02/25/2014 01:58:18 ET JOB#: [id number]  cc: Aaron Mose. Steele Ledonne, MD, <Dictator> Denise Bramblett Woodfin Ganja MD ELECTRONICALLY SIGNED 02/25/2014 21:09

## 2014-12-04 NOTE — Consult Note (Signed)
PATIENT NAME:  Bethany Gillespie, Bethany Gillespie MR#:  [id number] DATE OF BIRTH:  1951-05-04  DATE OF CONSULTATION:  02/25/2014 REFERRING PHYSICIAN: Shreyang H. Posey Pronto, M.D. CONSULTING PHYSICIAN:  A. Lavone Orn, MD  CHIEF COMPLAINT: Uncontrolled diabetes.   HISTORY OF PRESENT ILLNESS: This is a 64 year old female seen in consultation for uncontrolled diabetes at the request of Dr. Posey Pronto. The patient was admitted earlier today with 3 days' duration of nausea, vomiting, and diarrhea. She was found to have an initial blood sugar of 546 with a normal serum potassium, normal creatinine of 1.26, and a normal bicarbonate of 21, and a mildly elevated anion gap of 24. She was initiated on IV insulin and IV fluids, and blood sugars have steadily improved. Most recent blood sugars were in the 200s. The patient's IV insulin has been stopped and she was started on subcutaneous insulin, Levemir 15 units.   The patient has a history of diabetes for the last 3 years. She follows with Dr. Marcie Bal Dear, who prescribes her diabetes medications. She had been taking only metformin for her diabetes up until 3 weeks ago when Levemir insulin was added. In the last 3 days, she has been feeling very poorly. She has not taken her diabetes medications, including the metformin or the Levemir. She has been checking her blood sugars regularly and recalls they have been in the 200 range. She has never been hospitalized in the past for her diabetes. She has no known complications from diabetes.   PAST MEDICAL HISTORY:  1. Diabetes mellitus.  2. Hypertension.   PAST SURGICAL HISTORY: None.   ALLERGIES: No known drug allergies.   FAMILY HISTORY: She has a grandson with type 1 diabetes.   SOCIAL HISTORY: She is a widow. Husband passed away 2 years ago. She lives in her own home with her daughter. Denies tobacco use. Denies alcohol use.   HOME MEDICATIONS: 1. Levemir 30 units each morning.  2. Metformin, dose not known, b.i.d.  3. Lisinopril,  dose not known, daily.   REVIEW OF SYSTEMS:  GENERAL: She reports a 30-pound weight loss in the last 2 years, denies fevers.  HEENT: Denies blurred vision. She reports increased thirst.  NECK: Denies neck pain or dysphagia.  CARDIAC: Denies chest pain or palpitation.  PULMONARY: Denies cough or shortness of breath.  ABDOMEN: Denies nausea or vomiting at this time. Denies abdominal pain. Appetite is decreased.  EXTREMITIES: Denies lower extremity swelling.  SKIN: Denies rash or recent skin changes. ENDOCRINE: Denies heat or cold intolerance.  HEMATOLOGIC: Denies easy bruisability or recent bleeding.   PHYSICAL EXAMINATION:  VITAL SIGNS: Height 61.9 inches, weight 103 pounds, BMI 19. Temperature 98.8, pulse 94, respirations 20, blood pressure 140/70.  GENERAL: Well-developed, white female, in no acute distress.  HEENT: EOMI. Oropharynx is clear. Mucous membranes are moist.  NECK: Supple. No thyromegaly.  CARDIAC: Regular rate and rhythm. No murmur.  PULMONARY: Clear to auscultation bilaterally. No wheeze or rhonchi.  ABDOMEN: Diffusely soft, nontender, nondistended.  EXTREMITIES: No peripheral edema is present.  SKIN: No rash or dermatopathy is present.  NEUROLOGIC: She is alert and oriented.  PSYCHIATRIC: She is calm and cooperative.   LABORATORY DATA: Glucose 255, BUN 48, creatinine 1.1, sodium 139, potassium 2.7, chloride 101, bicarbonate 21. Hemoglobin A1c 9.7%.   ASSESSMENT: This is a 64 year old female with a 3-year history of diabetes, recently requiring insulin in addition to oral medications, admitted with severe hyperglycemia, now improved after the use of IV insulin and IV fluids.   RECOMMENDATIONS:  1. Agree with resumption of her daily Levemir 15 units. This will be given at nighttime.  2. Start NovoLog 3 units t.i.d. a.c.  3. Continue NovoLog before meals and at bedtime, sliding scale, with 2 units per 50 mg/l over a target of 150 mg/dl.  4. Restart metformin at 1000 mg  b.i.d.  5. We will check a morning glucose and C-peptide, to help differentiate between type 1 versus type 2 diabetes. At her age, type 2 diabetes would certainly be more common; however, given the severity of hyperglycemia and rapid worsening of diabetes, as well as family history of type 1 diabetes, there is a chance that she does have underlying type 1 diabetes and is insulinopenic.   Thanks for the kind request for consultation. I will arrange for outpatient clinic follow-up in 1 to 2 weeks. Please sure to communicate that visit to the patient upon discharge.   I will also not be available to see the patient over the next 3 days as I will not be in the office. I will be available by page if needed.   ____________________________ A. Lavone Orn, MD ams:jr D: 02/25/2014 16:59:59 ET T: 02/25/2014 17:29:42 ET JOB#: [id number]  cc: A. Lavone Orn, MD, <Dictator> Sherlon Handing MD ELECTRONICALLY SIGNED 03/08/2014 22:50

## 2014-12-04 NOTE — Consult Note (Signed)
PATIENT NAME:  Bethany Gillespie, Bethany Gillespie MR#:  [id number] DATE OF BIRTH:  Dec 16, 1950  DATE OF CONSULTATION:  02/27/2014  CONSULTING PHYSICIAN:  Elta Guadeloupe A. Marina Gravel, MD  REASON FOR CONSULTATION: Evaluation of large hiatal hernia.   HISTORY: A 64 year old white female with a several month history of progressively worsening dysphagia, odynophagia, nausea, vomiting, inability to take p.o., weight loss, regurgitation, admitted to the medical service on the 16th of July with significant hyperglycemia.  She was thought to have hyperosmolar state.  She was treated with insulin and rehydrated.  Now her  sugars are much improved. She has continued to complain of abdominal pain, which predates her admission for several month in duration. Because of this, CT scan was obtained which I had personally reviewed. There is a large hiatal hernia which contains markedly dilated stomach with thickened wall, appears to be a transition point within the hiatal hernia as well.  Gastric volvulus cannot be excluded. No evidence of small bowel obstruction. 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 GI medicine did see the patient, who then contacted me personally regarding his concerns.  I have spoken with Dr. Gustavo Lah regarding the fact that she has a palpable left upper quadrant mass consistent with a large dilated stomach. We feel urgent repair is needed due to her significant symptoms.   ALLERGIES: BEE STINGS.   MEDICATIONS AT HOME: Lisinopril.   CURRENT MEDICATIONS: Ceftriaxone, metoprolol, heparin, Levemir, metformin, Valium as needed, acetaminophen, Cepacol, hydralazine.   PAST MEDICAL HISTORY: Significant for diabetes.   PAST SURGICAL HISTORY: None.   SOCIAL HISTORY: Denies alcohol and tobacco use.   FAMILY HISTORY: Negative for cardiopulmonary disease.   REVIEW OF SYSTEMS:  As described above except for positive in GI.   PHYSICAL EXAMINATION: GENERAL: The patient is very ill-appearing with emesis basin,  containing feculent-appearing bile stained emesis.  VITAL SIGNS: Temperature is 99, pulse of 116, respirations 18 and unlabored on room air, 92%, blood pressure 112/72.  LUNGS: Clear.  HEART: Tachycardic.  ABDOMEN: Soft. There is a large left upper quadrant mass consistent with palpable stomach. Mildly tender.  EXTREMITIES: Warm and well perfused.   LABORATORY VALUES: Glucose is 122, creatinine 0.64, sodium 145, potassium 3.4, CO2 is 32. White count on admission was 15 now 9.4, hemoglobin 16.3, hematocrit 49.5, platelet count  124,000.   IMPRESSION: Hiatal hernia with significant symptoms and component of the gastric outlet obstruction.   PLAN: Urgent open repair with Nissen fundoplication, GJ tube pexy, as well as likely mesh hernia repair of the diaphragm. I discussed this case with the patient and her daughter. She had gotten some Valium prior to my arrival on the 2nd visit and therefore consent will be obtained from patient's daughter, as the patient is now currently medically unable to provide consent. She had verbalized earlier that she was interested in surgical intervention.   TOTAL TIME SPENT: 65 minutes.   ____________________________ Jeannette How Marina Gravel, MD mab:ds D: 02/27/2014 18:09:39 ET T: 02/27/2014 19:08:58 ET JOB#: [id number]  cc: Elta Guadeloupe A. Marina Gravel, MD, <Dictator> Hortencia Conradi MD ELECTRONICALLY SIGNED 03/03/2014 11:06

## 2016-02-26 ENCOUNTER — Emergency Department: Payer: BLUE CROSS/BLUE SHIELD

## 2016-02-26 ENCOUNTER — Inpatient Hospital Stay
Admission: EM | Admit: 2016-02-26 | Discharge: 2016-03-01 | DRG: 286 | Disposition: A | Discharge status: Home / Self Care | Payer: BLUE CROSS/BLUE SHIELD | Attending: Internal Medicine | Admitting: Internal Medicine

## 2016-02-26 ENCOUNTER — Encounter: Payer: Self-pay | Admitting: Emergency Medicine

## 2016-02-26 DIAGNOSIS — Z91030 Bee allergy status: Secondary | ICD-10-CM

## 2016-02-26 DIAGNOSIS — N390 Urinary tract infection, site not specified: Secondary | ICD-10-CM | POA: Diagnosis present

## 2016-02-26 DIAGNOSIS — Z950 Presence of cardiac pacemaker: Secondary | ICD-10-CM | POA: Diagnosis not present

## 2016-02-26 DIAGNOSIS — Z66 Do not resuscitate: Secondary | ICD-10-CM | POA: Diagnosis present

## 2016-02-26 DIAGNOSIS — J9601 Acute respiratory failure with hypoxia: Secondary | ICD-10-CM | POA: Diagnosis present

## 2016-02-26 DIAGNOSIS — J90 Pleural effusion, not elsewhere classified: Secondary | ICD-10-CM | POA: Diagnosis not present

## 2016-02-26 DIAGNOSIS — I509 Heart failure, unspecified: Secondary | ICD-10-CM | POA: Diagnosis not present

## 2016-02-26 DIAGNOSIS — Z833 Family history of diabetes mellitus: Secondary | ICD-10-CM | POA: Diagnosis not present

## 2016-02-26 DIAGNOSIS — I5043 Acute on chronic combined systolic (congestive) and diastolic (congestive) heart failure: Secondary | ICD-10-CM | POA: Diagnosis present

## 2016-02-26 DIAGNOSIS — R0602 Shortness of breath: Secondary | ICD-10-CM | POA: Diagnosis present

## 2016-02-26 DIAGNOSIS — I441 Atrioventricular block, second degree: Secondary | ICD-10-CM | POA: Diagnosis present

## 2016-02-26 DIAGNOSIS — E0810 Diabetes mellitus due to underlying condition with ketoacidosis without coma: Secondary | ICD-10-CM

## 2016-02-26 DIAGNOSIS — I10 Essential (primary) hypertension: Secondary | ICD-10-CM | POA: Diagnosis present

## 2016-02-26 DIAGNOSIS — I428 Other cardiomyopathies: Secondary | ICD-10-CM | POA: Diagnosis present

## 2016-02-26 DIAGNOSIS — I110 Hypertensive heart disease with heart failure: Principal | ICD-10-CM | POA: Diagnosis present

## 2016-02-26 DIAGNOSIS — E119 Type 2 diabetes mellitus without complications: Secondary | ICD-10-CM | POA: Diagnosis present

## 2016-02-26 DIAGNOSIS — Z79899 Other long term (current) drug therapy: Secondary | ICD-10-CM

## 2016-02-26 DIAGNOSIS — Z794 Long term (current) use of insulin: Secondary | ICD-10-CM | POA: Diagnosis not present

## 2016-02-26 DIAGNOSIS — I5021 Acute systolic (congestive) heart failure: Secondary | ICD-10-CM | POA: Diagnosis present

## 2016-02-26 DIAGNOSIS — E1159 Type 2 diabetes mellitus with other circulatory complications: Secondary | ICD-10-CM | POA: Diagnosis not present

## 2016-02-26 DIAGNOSIS — I2510 Atherosclerotic heart disease of native coronary artery without angina pectoris: Secondary | ICD-10-CM | POA: Diagnosis not present

## 2016-02-26 DIAGNOSIS — E876 Hypokalemia: Secondary | ICD-10-CM | POA: Diagnosis present

## 2016-02-26 HISTORY — DX: Type 2 diabetes mellitus without complications: E11.9

## 2016-02-26 HISTORY — DX: Essential (primary) hypertension: I10

## 2016-02-26 HISTORY — DX: Bradycardia, unspecified: R00.1

## 2016-02-26 HISTORY — DX: Atrioventricular block, second degree: I44.1

## 2016-02-26 HISTORY — DX: Ventral hernia without obstruction or gangrene: K43.9

## 2016-02-26 LAB — URINALYSIS COMPLETE WITH MICROSCOPIC (ARMC ONLY)
BACTERIA UA: NONE SEEN
BILIRUBIN URINE: NEGATIVE
HGB URINE DIPSTICK: NEGATIVE
Leukocytes, UA: NEGATIVE
NITRITE: POSITIVE — AB
Protein, ur: NEGATIVE mg/dL
RBC / HPF: NONE SEEN RBC/hpf (ref 0–5)
Specific Gravity, Urine: 1.007 (ref 1.005–1.030)
pH: 5 (ref 5.0–8.0)

## 2016-02-26 LAB — BASIC METABOLIC PANEL
Anion gap: 10 (ref 5–15)
BUN: 21 mg/dL — AB (ref 6–20)
CALCIUM: 8.8 mg/dL — AB (ref 8.9–10.3)
CO2: 25 mmol/L (ref 22–32)
CREATININE: 0.67 mg/dL (ref 0.44–1.00)
Chloride: 101 mmol/L (ref 101–111)
GFR calc Af Amer: >60 mL/min (ref >60)
GLUCOSE: 403 mg/dL — AB (ref 65–99)
POTASSIUM: 3.8 mmol/L (ref 3.5–5.1)
SODIUM: 136 mmol/L (ref 135–145)

## 2016-02-26 LAB — CBC WITH DIFFERENTIAL/PLATELET
Basophils Absolute: 0.1 10*3/uL (ref 0–0.1)
Basophils Relative: 1 %
EOS ABS: 0.1 10*3/uL (ref 0–0.7)
EOS PCT: 1 %
HCT: 42.3 % (ref 35.0–47.0)
Hemoglobin: 14.1 g/dL (ref 12.0–16.0)
LYMPHS ABS: 1.4 10*3/uL (ref 1.0–3.6)
LYMPHS PCT: 19 %
MCH: 27.2 pg (ref 26.0–34.0)
MCHC: 33.2 g/dL (ref 32.0–36.0)
MCV: 82 fL (ref 80.0–100.0)
MONO ABS: 0.4 10*3/uL (ref 0.2–0.9)
MONOS PCT: 5 %
Neutro Abs: 5.5 10*3/uL (ref 1.4–6.5)
Neutrophils Relative %: 74 %
PLATELETS: 176 10*3/uL (ref 150–440)
RBC: 5.17 MIL/uL (ref 3.80–5.20)
RDW: 15.7 % — AB (ref 11.5–14.5)
WBC: 7.5 10*3/uL (ref 3.6–11.0)

## 2016-02-26 LAB — BRAIN NATRIURETIC PEPTIDE: B NATRIURETIC PEPTIDE 5: 1562 pg/mL — AB (ref 0.0–100.0)

## 2016-02-26 LAB — LACTIC ACID, PLASMA: Lactic Acid, Venous: 1.8 mmol/L (ref 0.5–1.9)

## 2016-02-26 LAB — TROPONIN I

## 2016-02-26 MED ORDER — SODIUM CHLORIDE 0.9 % IV BOLUS (SEPSIS): 250.0000 mL | Freq: Once | INTRAVENOUS | Status: DC

## 2016-02-26 MED ORDER — SODIUM CHLORIDE 0.9 % IV BOLUS (SEPSIS): 500.0000 mL | Freq: Once | INTRAVENOUS | Status: DC

## 2016-02-26 MED ORDER — IPRATROPIUM-ALBUTEROL 0.5-2.5 (3) MG/3ML IN SOLN
3.0000 mL | Freq: Once | RESPIRATORY_TRACT | Status: AC
  Administered 2016-02-26: 3 mL via RESPIRATORY_TRACT
  Filled 2016-02-26: qty 3

## 2016-02-26 MED ORDER — FUROSEMIDE 10 MG/ML IJ SOLN
40.0000 mg | Freq: Once | INTRAMUSCULAR | Status: AC
  Administered 2016-02-26: 40 mg via INTRAVENOUS
  Filled 2016-02-26: qty 4

## 2016-02-26 MED ORDER — SODIUM CHLORIDE 0.9 % IV BOLUS (SEPSIS)
1000.0000 mL | Freq: Once | INTRAVENOUS | Status: AC
  Administered 2016-02-26: 1000 mL via INTRAVENOUS

## 2016-02-26 MED ORDER — DEXTROSE 5 % IV SOLN
500.0000 mg | Freq: Once | INTRAVENOUS | Status: AC
  Administered 2016-02-26: 500 mg via INTRAVENOUS
  Filled 2016-02-26: qty 500

## 2016-02-26 MED ORDER — DEXTROSE 5 % IV SOLN
1.0000 g | Freq: Once | INTRAVENOUS | Status: AC
  Administered 2016-02-26: 1 g via INTRAVENOUS
  Filled 2016-02-26: qty 10

## 2016-02-26 NOTE — ED Provider Notes (Signed)
[facility] Emergency Department Provider Note    ____________________________________________  Time seen: ~1910  I have reviewed the triage vital signs and the nursing notes.   HISTORY  Chief Complaint Shortness of Breath   History limited by: Not Limited   HPI Bethany Gillespie is a 65 y.o. female who presents to the emergency department today because of concerns for breathing difficulty. The patient says that the breathing difficulty started 1 week ago. At that time and she went to [facility] and was diagnosed with pneumonia. She says she was put on an antibiotic for 5 days however was unable to recall its name. She does not feel like she is gotten any better since being on the antibiotic. She has had a cough productive of clear phlegm. Addition she has noticed some swelling in her lower legs. She denies any fevers.    Past Medical History  Diagnosis Date  . Hernia of abdominal wall   . Diabetes ([place])   . Bradycardia     There are no active problems to display for this patient.   Past Surgical History  Procedure Laterality Date  . Hernia repair    . Pacemaker insertion    . Pacemaker insertion      No current outpatient prescriptions on file.  Allergies Bee venom  No family history on file.  Social History Social History  Substance Use Topics  . Smoking status: Never Smoker   . Smokeless tobacco: Never Used  . Alcohol Use: No    Review of Systems  Constitutional: Negative for fever. Cardiovascular: Negative for chest pain. Respiratory: Negative for shortness of breath. Gastrointestinal: Negative for abdominal pain, vomiting and diarrhea. Neurological: Negative for headaches, focal weakness or numbness.  10-point ROS otherwise negative.  ____________________________________________   PHYSICAL EXAM:  VITAL SIGNS: ED Triage Vitals  Enc Vitals Group     BP 02/26/16 1812 176/94 mmHg     Pulse Rate 02/26/16 1812 120     Resp  02/26/16 1812 24     Temp 02/26/16 1812 99.4 F (37.4 C)     Temp Source 02/26/16 1812 Oral     SpO2 02/26/16 1812 87 %     Weight 02/26/16 1812 125 lb (56.7 kg)     Height 02/26/16 1812 5\' 2"  (1.575 m)   Constitutional: Alert and oriented. Moderate respiratory distress Eyes: Conjunctivae are normal. PERRL. Normal extraocular movements. ENT   Head: Normocephalic and atraumatic.   Nose: No congestion/rhinnorhea.   Mouth/Throat: Mucous membranes are moist.   Neck: No stridor. Hematological/Lymphatic/Immunilogical: No cervical lymphadenopathy. Cardiovascular: Tachycardic. No murmurs or rubs appreciated. Respiratory: Increased respiratory effort. Tachypnea. rhonchi bilateral bases. Gastrointestinal: Soft and nontender. No distention. There is no CVA tenderness. Genitourinary: Deferred Musculoskeletal: Normal range of motion in all extremities. No joint effusions.  1+ bilateral lower extremity pitting edema Neurologic:  Normal speech and language. No gross focal neurologic deficits are appreciated.  Skin:  Skin is warm, dry and intact. No rash noted. Psychiatric: Mood and affect are normal. Speech and behavior are normal. Patient exhibits appropriate insight and judgment.  ____________________________________________    LABS (pertinent positives/negatives)  Labs Reviewed  CBC WITH DIFFERENTIAL/PLATELET - Abnormal; Notable for the following:    RDW 15.7 (*)    All other components within normal limits  BASIC METABOLIC PANEL - Abnormal; Notable for the following:    Glucose, Bld 403 (*)    BUN 21 (*)    Calcium 8.8 (*)    All other components within  normal limits  URINALYSIS COMPLETEWITH MICROSCOPIC ([facility] ONLY) - Abnormal; Notable for the following:    Color, Urine COLORLESS (*)    APPearance CLEAR (*)    Glucose, UA >500 (*)    Ketones, ur TRACE (*)    Nitrite POSITIVE (*)    Squamous Epithelial / LPF 0-5 (*)    All other components within normal limits  BRAIN  NATRIURETIC PEPTIDE - Abnormal; Notable for the following:    B Natriuretic Peptide 1562.0 (*)    All other components within normal limits  CULTURE, BLOOD (ROUTINE X 2)  CULTURE, BLOOD (ROUTINE X 2)  URINE CULTURE  TROPONIN I  LACTIC ACID, PLASMA  LACTIC ACID, PLASMA     ____________________________________________   EKG  None  ____________________________________________    RADIOLOGY  CXR IMPRESSION: Bilateral pleural effusions with bilateral lower lung atelectasis/ consolidation/airspace disease.  Probable cardiomegaly.  I, Conway Fedora, personally viewed and evaluated these images (plain radiographs) as part of my medical decision making. ____________________________________________   PROCEDURES  Procedure(s) performed: None  Critical Care performed: Yes, see critical care note(s)  CRITICAL CARE Performed by: Nance Pear   Total critical care time: 30 minutes  Critical care time was exclusive of separately billable procedures and treating other patients.  Critical care was necessary to treat or prevent imminent or life-threatening deterioration.  Critical care was time spent personally by me on the following activities: development of treatment plan with patient and/or surrogate as well as nursing, discussions with consultants, evaluation of patient's response to treatment, examination of patient, obtaining history from patient or surrogate, ordering and performing treatments and interventions, ordering and review of laboratory studies, ordering and review of radiographic studies, pulse oximetry and re-evaluation of patient's condition.  ____________________________________________   INITIAL IMPRESSION / ASSESSMENT AND PLAN / ED COURSE  Pertinent labs & imaging results that were available during my care of the patient were reviewed by me and considered in my medical decision making (see chart for details).  This presented to the emergency  department today because of concerns for shortness of breath. Initial vital signs were concerning for tachypnea and tachycardia. Additionally chest x-ray did show possible consolidation. Because of this a code sepsis was called and broad-spectrum antibiotics were given. However shortly after patient started fluid she started feeling worse with her shortness of breath. A BNP was added on. The fluids were stopped. BNP came back elevated at 1500. Patient was placed on BiPAP. She did seem to respond well to the BiPAP. In addition patient was given Lasix. At this point I wonder if the symptoms are more a result of heart failure than sepsis. Patient will be admitted to the hospitalist service for further workup and evaluation. ____________________________________________   FINAL CLINICAL IMPRESSION(S) / ED DIAGNOSES  Final diagnoses:  Shortness of breath  Congestive heart failure, unspecified congestive heart failure chronicity, unspecified congestive heart failure type [facility])     Note: This dictation was prepared with Dragon dictation. Any transcriptional errors that result from this process are unintentional    Nance Pear, MD 02/26/16 2307

## 2016-02-26 NOTE — ED Notes (Signed)
Pt c/o SHOB that started "the other day" but it didn't last long, states this instance of SHOB has lasted all day. Pt noted to be tachypnic on assessment. Pt noted to have dry non productive cough, 87% on RA.

## 2016-02-26 NOTE — H&P (Signed)
[place] [facility] [place] NAME: Bethany Gillespie    MR#:  [id number]  DATE OF BIRTH:  13-Jul-1951  DATE OF ADMISSION:  02/26/2016  PRIMARY CARE PHYSICIAN: No PCP Per Patient   REQUESTING/REFERRING PHYSICIAN: Archie Balboa, MD  CHIEF COMPLAINT:   Chief Complaint  Patient presents with  . Shortness of Breath    HISTORY OF PRESENT ILLNESS:  Bethany Gillespie  is a 65 y.o. female who presents with Symptoms of progressively worsening shortness of breath, but with acute onset discomfort and significantly worse shortness of breath this afternoon. Patient states that for the past several days to a week she isn't having orthopnea and having to sleep sitting up. However today when she got up to walk outside with her grandchildren she became acutely short of breath and had some chest discomfort. ED workup showed likely heart failure on chest x-ray, which is concordant with a BNP of over thousand. Patient is also had increasing lower extremity edema. She has no prior diagnosis of heart failure. 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 She was placed on BiPAP with good response in her breathing status, and was eventually weaned in the ED. Hospitals were called for admission for further evaluation and treatment.  PAST MEDICAL HISTORY:   Past Medical History  Diagnosis Date  . Hernia of abdominal wall   . Diabetes ([place])   . Bradycardia   . AV block, Mobitz 2   . HTN (hypertension)     PAST SURGICAL HISTORY:   Past Surgical History  Procedure Laterality Date  . Hernia repair    . Pacemaker insertion    . Pacemaker insertion      SOCIAL HISTORY:   Social History  Substance Use Topics  . Smoking status: Never Smoker   . Smokeless tobacco: Never Used  . Alcohol Use: No    FAMILY HISTORY:   Family History  Problem Relation Age of Onset  . Diabetes Mellitus I Grandchild     DRUG ALLERGIES:   Allergies  Allergen  Reactions  . Bee Venom     MEDICATIONS AT HOME:   Prior to Admission medications   Not on File    REVIEW OF SYSTEMS:  Review of Systems  Constitutional: Negative for fever, chills, weight loss and malaise/fatigue.  HENT: Negative for ear pain, hearing loss and tinnitus.   Eyes: Negative for blurred vision, double vision, pain and redness.  Respiratory: Positive for shortness of breath. Negative for cough and hemoptysis.   Cardiovascular: Positive for orthopnea and leg swelling. Negative for chest pain and palpitations.  Gastrointestinal: Negative for nausea, vomiting, abdominal pain, diarrhea and constipation.  Genitourinary: Negative for dysuria, frequency and hematuria.  Musculoskeletal: Negative for back pain, joint pain and neck pain.  Skin:       No acne, rash, or lesions  Neurological: Negative for dizziness, tremors, focal weakness and weakness.  Endo/Heme/Allergies: Negative for polydipsia. Does not bruise/bleed easily.  Psychiatric/Behavioral: Negative for depression. The patient is not nervous/anxious and does not have insomnia.      VITAL SIGNS:   Filed Vitals:   02/26/16 2016 02/26/16 2027 02/26/16 2030 02/26/16 2100  BP:   123/76 119/84  Pulse:   43 97  Temp: 98.2 F (36.8 C)     TempSrc: Oral     Resp:   35 36  Height:      Weight:      SpO2:  96%  95% 100%   Wt Readings from Last 3 Encounters:  02/26/16 56.7 kg (125 lb)    PHYSICAL EXAMINATION:  Physical Exam  Vitals reviewed. Constitutional: She is oriented to person, place, and time. She appears well-developed and well-nourished. No distress.  HENT:  Head: Normocephalic and atraumatic.  Mouth/Throat: Oropharynx is clear and moist.  Eyes: Conjunctivae and EOM are normal. Pupils are equal, round, and reactive to light. No scleral icterus.  Neck: Normal range of motion. Neck supple. No JVD present. No thyromegaly present.  Cardiovascular: Normal rate, regular rhythm and intact distal pulses.  Exam  reveals no gallop and no friction rub.   No murmur heard. Respiratory: Effort normal. No respiratory distress. She has no wheezes. She has rales.  GI: Soft. Bowel sounds are normal. She exhibits no distension. There is no tenderness.  Musculoskeletal: Normal range of motion. She exhibits edema.  No arthritis, no gout  Lymphadenopathy:    She has no cervical adenopathy.  Neurological: She is alert and oriented to person, place, and time. No cranial nerve deficit.  No dysarthria, no aphasia  Skin: Skin is warm and dry. No rash noted. No erythema.  Psychiatric: She has a normal mood and affect. Her behavior is normal. Judgment and thought content normal.    LABORATORY PANEL:   CBC  Recent Labs Lab 02/26/16 1858  WBC 7.5  HGB 14.1  HCT 42.3  PLT 176   ------------------------------------------------------------------------------------------------------------------  Chemistries   Recent Labs Lab 02/26/16 1858  NA 136  K 3.8  CL 101  CO2 25  GLUCOSE 403*  BUN 21*  CREATININE 0.67  CALCIUM 8.8*   ------------------------------------------------------------------------------------------------------------------  Cardiac Enzymes  Recent Labs Lab 02/26/16 1858  TROPONINI <0.03   ------------------------------------------------------------------------------------------------------------------  RADIOLOGY:  Dg Chest 2 View  02/26/2016  CLINICAL DATA:  65 year old female with acute shortness of breath and cough. EXAM: CHEST  2 VIEW COMPARISON:  02/27/2014 chest radiograph FINDINGS: Apparent cardiomegaly and left-sided pacemaker again noted. Bilateral pleural effusions are identified with bilateral lower lung atelectasis/ consolidation/ airspace disease. There is no evidence of pneumothorax. No acute bony abnormalities are noted. IMPRESSION: Bilateral pleural effusions with bilateral lower lung atelectasis/ consolidation/airspace disease. Probable cardiomegaly. Electronically  Signed   By: Margarette Canada M.D.   On: 02/26/2016 18:53    EKG:   Orders placed or performed during the hospital encounter of 02/26/16  . ED EKG  . ED EKG    IMPRESSION AND PLAN:  Principal Problem:   Acute systolic CHF (congestive heart failure) ([place]) - Initially on BiPAP in the ED with much improvement in her respiratory status and oxygenation. She was weaned off the BiPAP, was given some diuresis treatment, initially given broad antibiotics in the ED as well. Continue diuresis. We will get an echo cardiogram and a cardiology consult. Active Problems:   Pleural effusion, bilateral - likely due to her heart failure, see above for treatment.   UTI - nitrite positive on UA, IV antibiotics started, urine culture sent.   Diabetes ([place]) - sliding scale insulin with corresponding glucose checks and carb modified diet   HTN (hypertension) - currently stable, continue home antihypertensives  All the records are reviewed and case discussed with ED provider. Management plans discussed with the patient and/or family.  DVT PROPHYLAXIS: SubQ lovenox  GI PROPHYLAXIS: None  ADMISSION STATUS: Inpatient  CODE STATUS: Full Code Status History    This patient does not have a recorded code status. Please follow your organizational policy for patients in this situation.  Advance Directive Documentation        Most Recent Value   Type of Advance Directive  Healthcare Power of Attorney   Pre-existing out of facility DNR order (yellow form or pink MOST form)     "MOST" Form in Place?        TOTAL TIME TAKING CARE OF THIS PATIENT: 45 minutes.    Lanika Colgate [place] 02/26/2016, 10:24 PM  [facility]  Office  [phone]  CC: Primary care physician; No PCP Per Patient

## 2016-02-26 NOTE — ED Notes (Signed)
Patient transported to X-ray 

## 2016-02-27 ENCOUNTER — Encounter: Payer: Self-pay | Admitting: Student

## 2016-02-27 ENCOUNTER — Inpatient Hospital Stay (HOSPITAL_COMMUNITY)
Admit: 2016-02-27 | Discharge: 2016-02-27 | Disposition: A | Discharge status: Home / Self Care | Payer: BLUE CROSS/BLUE SHIELD | Attending: Internal Medicine | Admitting: Internal Medicine

## 2016-02-27 DIAGNOSIS — J90 Pleural effusion, not elsewhere classified: Secondary | ICD-10-CM

## 2016-02-27 DIAGNOSIS — I5021 Acute systolic (congestive) heart failure: Secondary | ICD-10-CM

## 2016-02-27 DIAGNOSIS — I509 Heart failure, unspecified: Secondary | ICD-10-CM

## 2016-02-27 DIAGNOSIS — N390 Urinary tract infection, site not specified: Secondary | ICD-10-CM | POA: Diagnosis present

## 2016-02-27 LAB — CBC
HCT: 44.2 % (ref 35.0–47.0)
HEMOGLOBIN: 14.5 g/dL (ref 12.0–16.0)
MCH: 26.6 pg (ref 26.0–34.0)
MCHC: 32.8 g/dL (ref 32.0–36.0)
MCV: 81 fL (ref 80.0–100.0)
PLATELETS: 168 10*3/uL (ref 150–440)
RBC: 5.46 MIL/uL — ABNORMAL HIGH (ref 3.80–5.20)
RDW: 15.2 % — AB (ref 11.5–14.5)
WBC: 7.1 10*3/uL (ref 3.6–11.0)

## 2016-02-27 LAB — ECHOCARDIOGRAM COMPLETE
Height: 62 in
Weight: 2116.8 oz

## 2016-02-27 LAB — TROPONIN I
Troponin I: <0.03 ng/mL (ref <0.03)
Troponin I: 0.03 ng/mL (ref <0.03)

## 2016-02-27 LAB — BASIC METABOLIC PANEL
Anion gap: 9 (ref 5–15)
BUN: 18 mg/dL (ref 6–20)
CALCIUM: 8.5 mg/dL — AB (ref 8.9–10.3)
CHLORIDE: 101 mmol/L (ref 101–111)
CO2: 28 mmol/L (ref 22–32)
CREATININE: 0.68 mg/dL (ref 0.44–1.00)
Glucose, Bld: 361 mg/dL — ABNORMAL HIGH (ref 65–99)
POTASSIUM: 3.4 mmol/L — AB (ref 3.5–5.1)
SODIUM: 138 mmol/L (ref 135–145)

## 2016-02-27 LAB — MAGNESIUM: MAGNESIUM: 1.8 mg/dL (ref 1.7–2.4)

## 2016-02-27 LAB — GLUCOSE, CAPILLARY
GLUCOSE-CAPILLARY: 385 mg/dL — AB (ref 65–99)
GLUCOSE-CAPILLARY: 247 mg/dL — AB (ref 65–99)
GLUCOSE-CAPILLARY: 298 mg/dL — AB (ref 65–99)
Glucose-Capillary: 287 mg/dL — ABNORMAL HIGH (ref 65–99)
Glucose-Capillary: 188 mg/dL — ABNORMAL HIGH (ref 65–99)

## 2016-02-27 LAB — HEMOGLOBIN A1C: HEMOGLOBIN A1C: 12 % — AB (ref 4.0–6.0)

## 2016-02-27 MED ORDER — ENOXAPARIN SODIUM 40 MG/0.4ML ~~LOC~~ SOLN
40.0000 mg | SUBCUTANEOUS | Status: DC
  Administered 2016-02-27 – 2016-02-29 (×3): 40 mg via SUBCUTANEOUS
  Filled 2016-02-27 (×3): qty 0.4

## 2016-02-27 MED ORDER — LISINOPRIL 20 MG PO TABS
40.0000 mg | ORAL_TABLET | Freq: Every day | ORAL | Status: DC
  Administered 2016-02-27: 40 mg via ORAL
  Filled 2016-02-27 (×2): qty 2

## 2016-02-27 MED ORDER — ONDANSETRON HCL 4 MG PO TABS: 4.0000 mg | ORAL_TABLET | Freq: Four times a day (QID) | ORAL | Status: DC | PRN

## 2016-02-27 MED ORDER — CETYLPYRIDINIUM CHLORIDE 0.05 % MT LIQD
7.0000 mL | Freq: Two times a day (BID) | OROMUCOSAL | Status: DC
  Administered 2016-02-27 – 2016-02-29 (×2): 7 mL via OROMUCOSAL

## 2016-02-27 MED ORDER — DEXTROSE 5 % IV SOLN
2.0000 g | INTRAVENOUS | Status: DC
  Filled 2016-02-27: qty 2

## 2016-02-27 MED ORDER — POTASSIUM CHLORIDE CRYS ER 20 MEQ PO TBCR
20.0000 meq | EXTENDED_RELEASE_TABLET | Freq: Two times a day (BID) | ORAL | Status: DC
  Administered 2016-02-27 – 2016-02-29 (×6): 20 meq via ORAL
  Filled 2016-02-27 (×6): qty 1

## 2016-02-27 MED ORDER — INSULIN ASPART 100 UNIT/ML ~~LOC~~ SOLN
0.0000 [IU] | Freq: Every day | SUBCUTANEOUS | Status: DC
  Administered 2016-02-27: 3 [IU] via SUBCUTANEOUS
  Administered 2016-02-28 – 2016-02-29 (×2): 4 [IU] via SUBCUTANEOUS
  Filled 2016-02-27 (×2): qty 4

## 2016-02-27 MED ORDER — ONDANSETRON HCL 4 MG/2ML IJ SOLN: 4.0000 mg | Freq: Four times a day (QID) | INTRAMUSCULAR | Status: DC | PRN

## 2016-02-27 MED ORDER — INSULIN DETEMIR 100 UNIT/ML ~~LOC~~ SOLN
15.0000 [IU] | Freq: Every day | SUBCUTANEOUS | Status: DC
  Administered 2016-02-27 – 2016-02-29 (×3): 15 [IU] via SUBCUTANEOUS
  Filled 2016-02-27 (×5): qty 0.15

## 2016-02-27 MED ORDER — CARVEDILOL 3.125 MG PO TABS
3.1250 mg | ORAL_TABLET | Freq: Two times a day (BID) | ORAL | Status: DC
  Administered 2016-02-27 – 2016-02-29 (×3): 3.125 mg via ORAL
  Filled 2016-02-27 (×4): qty 1

## 2016-02-27 MED ORDER — INSULIN GLARGINE 100 UNIT/ML ~~LOC~~ SOLN
12.0000 [IU] | Freq: Every day | SUBCUTANEOUS | Status: DC
  Filled 2016-02-27: qty 0.12

## 2016-02-27 MED ORDER — INSULIN ASPART 100 UNIT/ML ~~LOC~~ SOLN
0.0000 [IU] | Freq: Three times a day (TID) | SUBCUTANEOUS | Status: DC
  Administered 2016-02-27: 8 [IU] via SUBCUTANEOUS
  Administered 2016-02-27: 3 [IU] via SUBCUTANEOUS
  Administered 2016-02-27: 5 [IU] via SUBCUTANEOUS
  Administered 2016-02-28 (×2): 2 [IU] via SUBCUTANEOUS
  Filled 2016-02-27: qty 8
  Filled 2016-02-27: qty 2
  Filled 2016-02-27: qty 3
  Filled 2016-02-27: qty 2
  Filled 2016-02-27: qty 3
  Filled 2016-02-27: qty 5

## 2016-02-27 MED ORDER — SODIUM CHLORIDE 0.9% FLUSH
3.0000 mL | Freq: Two times a day (BID) | INTRAVENOUS | Status: DC
Start: 2016-02-27 — End: 2016-03-01
  Administered 2016-02-27 – 2016-02-29 (×7): 3 mL via INTRAVENOUS

## 2016-02-27 MED ORDER — FUROSEMIDE 10 MG/ML IJ SOLN
40.0000 mg | Freq: Two times a day (BID) | INTRAMUSCULAR | Status: DC
  Administered 2016-02-27 – 2016-02-29 (×6): 40 mg via INTRAVENOUS
  Filled 2016-02-27 (×6): qty 4

## 2016-02-27 MED ORDER — INSULIN ASPART 100 UNIT/ML ~~LOC~~ SOLN: 0.0000 [IU] | Freq: Three times a day (TID) | SUBCUTANEOUS | Status: DC

## 2016-02-27 MED ORDER — INSULIN ASPART 100 UNIT/ML ~~LOC~~ SOLN
5.0000 [IU] | Freq: Three times a day (TID) | SUBCUTANEOUS | Status: DC
  Administered 2016-02-28 – 2016-03-01 (×5): 5 [IU] via SUBCUTANEOUS
  Filled 2016-02-27 (×5): qty 5

## 2016-02-27 MED ORDER — ACETAMINOPHEN 650 MG RE SUPP: 650.0000 mg | Freq: Four times a day (QID) | RECTAL | Status: DC | PRN

## 2016-02-27 MED ORDER — FUROSEMIDE 10 MG/ML IJ SOLN
20.0000 mg | Freq: Once | INTRAMUSCULAR | Status: AC
  Administered 2016-02-27: 20 mg via INTRAVENOUS
  Filled 2016-02-27: qty 2

## 2016-02-27 MED ORDER — ACETAMINOPHEN 325 MG PO TABS: 650.0000 mg | ORAL_TABLET | Freq: Four times a day (QID) | ORAL | Status: DC | PRN

## 2016-02-27 MED ORDER — INSULIN ASPART 100 UNIT/ML ~~LOC~~ SOLN
0.0000 [IU] | Freq: Every day | SUBCUTANEOUS | Status: DC
  Administered 2016-02-27: 5 [IU] via SUBCUTANEOUS
  Filled 2016-02-27: qty 5

## 2016-02-27 MED ORDER — DEXTROSE 5 % IV SOLN
1.0000 g | INTRAVENOUS | Status: DC
  Administered 2016-02-27 – 2016-02-28 (×2): 1 g via INTRAVENOUS
  Filled 2016-02-27 (×3): qty 10

## 2016-02-27 NOTE — Progress Notes (Signed)
Pharmacy Antibiotic Note  Bethany Gillespie is a 65 y.o. female admitted on 02/26/2016 with UTI.  Pharmacy has been consulted for ceftriaxone dosing.  Plan: Ceftriaxone dose decreased to 1 g IV q24h  Height: 5\' 2"  (157.5 cm) Weight: 132 lb 4.8 oz (60.011 kg) IBW/kg (Calculated) : 50.1  Temp (24hrs), Avg:98.5 F (36.9 C), Min:97.7 F (36.5 C), Max:99.4 F (37.4 C)   Recent Labs Lab 02/26/16 1858 02/26/16 1918 02/27/16 0152  WBC 7.5  --  7.1  CREATININE 0.67  --  0.68  LATICACIDVEN  --  1.8  --     Estimated Creatinine Clearance: 56.9 mL/min (by C-G formula based on Cr of 0.68).    Allergies  Allergen Reactions  . Bee Venom     Antimicrobials this admission: Ceftriaxone 7/16 >>   Dose adjustments this admission:  Microbiology results: 7/16 BCx: pending 7/16 UCx: pending    7/16 UA: LE(-) NO2(+) WBC 0-5  Thank you for allowing pharmacy to be a part of this patient's care.  Lenis Noon, PharmD, BCPS Clinical Pharmacist 02/27/2016 11:16 AM

## 2016-02-27 NOTE — Progress Notes (Signed)
Inpatient Diabetes Program Recommendations  AACE/ADA: New Consensus Statement on Inpatient Glycemic Control (2015)  Target Ranges:  Prepandial:   less than 140 mg/dL      Peak postprandial:   less than 180 mg/dL (1-2 hours)      Critically ill patients:  140 - 180 mg/dL   Lab Results  Component Value Date   GLUCAP 247* 02/27/2016   HGBA1C 9.7* 02/25/2014    Review of Glycemic Control:  Results for Bethany Gillespie, Bethany Gillespie (MRN [id number]) as of 02/27/2016 10:34  Ref. Range 02/27/2016 01:35 02/27/2016 07:15  Glucose-Capillary Latest Ref Range: 65-99 mg/dL 385 (H) 247 (H)   Diabetes history: Diabetes Outpatient Diabetes medications: Levemir 18 units q HS, Novolog 10 units tid with meals Current orders for Inpatient glycemic control:  Lantus 12 units q HS, Novolog moderate tid with meals and HS  Inpatient Diabetes Program Recommendations:   Note that patient did not receive basal insulin last PM.  May consider d/c of Lantus and start of Levemir 15 units this morning.  Also consider adding Novolog meal coverage 5 units tid with meals.  Thanks, Adah Perl, RN, BC-ADM Inpatient Diabetes Coordinator Pager [phone] (8a-5p)

## 2016-02-27 NOTE — Progress Notes (Signed)
Echocardiogram 2D Echocardiogram has been performed.  Bethany Gillespie 02/27/2016, 1:52 PM

## 2016-02-27 NOTE — Progress Notes (Addendum)
[facility] NAME: Gweneviere Bier    MR#:  [id number]  DATE OF BIRTH:  June 17, 1951  SUBJECTIVE:  CHIEF COMPLAINT:   Chief Complaint  Patient presents with  . Shortness of Breath   Better cough and SOB. On O2 [place] 2L. REVIEW OF SYSTEMS:  Review of Systems  Constitutional: Negative for fever and chills.  Eyes: Negative for blurred vision.  Respiratory: Positive for cough, sputum production and shortness of breath. Negative for hemoptysis and wheezing.   Cardiovascular: Positive for orthopnea and leg swelling. Negative for chest pain and palpitations.  Gastrointestinal: Negative for nausea, vomiting, abdominal pain and diarrhea.  Genitourinary: Positive for dysuria and frequency. Negative for urgency.  Musculoskeletal: Negative for joint pain.  Neurological: Negative for dizziness, sensory change, speech change, focal weakness, weakness and headaches.  Psychiatric/Behavioral: Negative for depression. The patient is not nervous/anxious.     DRUG ALLERGIES:   Allergies  Allergen Reactions  . Bee Venom    VITALS:  Blood pressure 118/67, pulse 83, temperature 98.3 F (36.8 C), temperature source Oral, resp. rate 16, height 5\' 2"  (1.575 m), weight 132 lb 4.8 oz (60.011 kg), SpO2 92 %. PHYSICAL EXAMINATION:  Physical Exam  Constitutional: She is oriented to person, place, and time and well-developed, well-nourished, and in no distress.  HENT:  Head: Normocephalic.  Mouth/Throat: Oropharynx is clear and moist.  Eyes: Conjunctivae and EOM are normal.  Neck: Normal range of motion. Neck supple. No JVD present. No tracheal deviation present. No thyromegaly present.  Cardiovascular: Normal rate, regular rhythm, normal heart sounds and intact distal pulses.  Exam reveals no gallop and no friction rub.   No murmur heard. Pulmonary/Chest: No respiratory distress. She has no wheezes. She has rales.  Abdominal: She exhibits no distension.  There is no tenderness.  Musculoskeletal: Normal range of motion. She exhibits edema. She exhibits no tenderness.  Lymphadenopathy:    She has no cervical adenopathy.  Neurological: She is alert and oriented to person, place, and time. She has normal reflexes.  Skin: Skin is dry. No rash noted. No erythema. No pallor.  Psychiatric: Mood, memory, affect and judgment normal.   LABORATORY PANEL:   CBC  Recent Labs Lab 02/27/16 0152  WBC 7.1  HGB 14.5  HCT 44.2  PLT 168   ------------------------------------------------------------------------------------------------------------------ Chemistries   Recent Labs Lab 02/27/16 0152 02/27/16 0724  NA 138  --   K 3.4*  --   CL 101  --   CO2 28  --   GLUCOSE 361*  --   BUN 18  --   CREATININE 0.68  --   CALCIUM 8.5*  --   MG  --  1.8   RADIOLOGY:  Dg Chest 2 View  02/26/2016  CLINICAL DATA:  65 year old female with acute shortness of breath and cough. EXAM: CHEST  2 VIEW COMPARISON:  02/27/2014 chest radiograph FINDINGS: Apparent cardiomegaly and left-sided pacemaker again noted. Bilateral pleural effusions are identified with bilateral lower lung atelectasis/ consolidation/ airspace disease. There is no evidence of pneumothorax. No acute bony abnormalities are noted. IMPRESSION: Bilateral pleural effusions with bilateral lower lung atelectasis/ consolidation/airspace disease. Probable cardiomegaly. Electronically Signed   By: Margarette Canada M.D.   On: 02/26/2016 18:53   ASSESSMENT AND PLAN:   Acute systolic CHF (congestive heart failure) LV EF: 25% - 30%.  Continue lasix per cardiology consult.  Acute respiratory failure with hypoxia. off the BiPAP,  Try to wean off O2 [place],  neb prn.   Pleural effusion, bilateral - likely due to her heart failure, see above for treatment.   Hypokalemia. Given Po KCl.   UTI - nitrite positive on UA, IV rocephin, f/u urine culture.    Diabetes ([place]) - uncontrolled, add levemir 15 units HS,  novolog 5 units AC, continue sliding scale insulin with corresponding glucose checks and carb modified diet   HTN (hypertension) - currently stable, continue home antihypertensives  All the records are reviewed and case discussed with Care Management/Social Worker. Management plans discussed with the patient, family and they are in agreement.  CODE STATUS: full code.  TOTAL TIME TAKING CARE OF THIS PATIENT: 37 minutes.   More than 50% of the time was spent in counseling/coordination of care: YES  POSSIBLE D/C IN 2 DAYS, DEPENDING ON CLINICAL CONDITION.   Demetrios Loll M.D on 02/27/2016 at 4:50 PM  Between 7am to 6pm - Pager - [phone]  After 6pm go to [email or web address] - [employer] [facility]  Office  [phone]  CC: Primary care physician; No PCP Per Patient  Note: This dictation was prepared with Dragon dictation along with smaller phrase technology. Any transcriptional errors that result from this process are unintentional.

## 2016-02-27 NOTE — Consult Note (Signed)
Cardiology Consult    Patient ID: Bethany Gillespie MRN: [id number], DOB/AGE: 1951-04-09   Admit date: 02/26/2016 Date of Consult: 02/27/2016  Primary Physician: No PCP Per Patient Reason for Consult: CHF Primary Cardiologist: [facility] - Dr. Margrett Rud Requesting Provider: Dr. Jannifer Franklin  History of Present Illness    Bethany Gillespie is a 65 y.o. 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   She reports traveling to [place] in 11/2015 and developing a viral illness. [place], she developed Bell's Palsy and was placed on steroids for a duration of time. Since then, she reports having worsening dyspnea, orthopnea, and PND.  Over the past three days, her dyspnea and orthopnea progressed. She was unable to lay in bed even with the use of two pillows without getting short of breath. When stepping outside over the past several days, she had worsening dyspnea which she associated with the heat. Also has experienced a dry cough. She denies any chest discomfort, palpitations, or PND. Does report having lower extremity edema for the past several months. Also says she has gained over 14 pounds in the past 3 weeks (baseline around 120 lbs).   She denies any prior history of CAD or MI's. No known history of CHF. Did have PPM placement in 03/2015 due to 2nd Degree Heart Block and syncope. Denies any recurrent syncope since PPM placement. Echo in 03/2015 showed a preserved EF of 55-60% with Grade 1 DD.   While admitted, labs have shown a BNP of 1562. Cyclic troponin values have been <0.03, < 0.03, and 0.03. Na+ 136, K+ 3.8, glucose 403, creatinine 0.67, WBC 7.5, Hgb 14.1, and platelets 176. UA positive for nitrites (started on Ceftriaxone for likely UTI). CXR with bilateral pleural  effusions with bilateral lower lung atelectasis/ consolidation.   She was initially placed on BiPAP upon arrival to the ED, as her oxygen saturations were in the 80's. She has since been weaned to [facility] and now Room Air, with appropriate oxygen saturations. Was started on IV Lasix 40mg  BID with only a net recorded output of -760 mL thus far, however she reports having very frequent urination. Says her respiratory status and lower extremity edema has significantly improved since admission.   Past Medical History   Past Medical History  Diagnosis Date  . Hernia of abdominal wall   . Diabetes ([place])   . Bradycardia   . AV block, Mobitz 2     a. s/p [employer] PPM in 03/2015  . HTN (hypertension)     Past Surgical History  Procedure Laterality Date  . Hernia repair    . Pacemaker insertion    . Pacemaker insertion       Allergies  Allergies  Allergen Reactions  . Bee Venom     Inpatient Medications    . antiseptic oral rinse  7 mL Mouth Rinse BID  . cefTRIAXone (ROCEPHIN)  IV  2 g Intravenous Q24H  . enoxaparin (LOVENOX) injection  40 mg Subcutaneous Q24H  . furosemide  40 mg Intravenous BID  . insulin aspart  0-15 Units Subcutaneous TID WC  . insulin aspart  0-5 Units Subcutaneous QHS  . insulin glargine  12 Units Subcutaneous QHS  . lisinopril  40 mg Oral Daily  . potassium chloride  20 mEq Oral BID  . sodium chloride flush  3 mL Intravenous Q12H    Family History    Family History  Problem Relation Age of Onset  . Diabetes Mellitus I Grandchild     Social History    Social History   Social History  . Marital Status: Widowed    Spouse Name: N/A  . Number of Children: N/A  . Years of Education: N/A   Occupational History  . Not on file.   Social History Main Topics  . Smoking status: Never Smoker   . Smokeless tobacco: Never Used  . Alcohol Use: No  . Drug Use: No  . Sexual Activity: Not on file   Other Topics Concern  . Not on file     Social History Narrative     Review of Systems    General:  No chills, fever, night sweats or weight changes.  Cardiovascular:  No chest pain, palpitations, paroxysmal nocturnal dyspnea. Positive for edema, orthopnea, and dyspnea. Dermatological: No rash, lesions/masses Respiratory: Positive for cough and dyspnea.  Urologic: No hematuria, dysuria Abdominal:   No nausea, vomiting, diarrhea, bright red blood per rectum, melena, or hematemesis Neurologic:  No visual changes, wkns, changes in mental status. All other systems reviewed and are otherwise negative except as noted above.  Physical Exam    Blood pressure 146/80, pulse 87, temperature 98.9 F (37.2 C), temperature source Oral, resp. rate 18, height 5\' 2"  (1.575 m), weight 132 lb 4.8 oz (60.011 kg), SpO2 91 %.  General: Pleasant, Caucasian female appearing in NAD. Psych: Normal affect. Neuro: Alert and oriented X 3. Moves all extremities spontaneously. HEENT: Normal  Neck: Supple without bruits. JVD elevated at 9 cm. Lungs:  Resp regular and unlabored, mild rales at bases bilaterally. Heart: RRR no s3, s4, or murmurs. Abdomen: Soft, non-tender, non-distended, BS + x 4.  Extremities: No clubbing or cyanosis, trace lower extremity edema present. DP/PT/Radials 2+ and equal bilaterally.  Labs    Troponin (Point of Care Test) No results for input(s): TROPIPOC in the last 72 hours.  Recent Labs  02/26/16 1858 02/27/16 0152 02/27/16 0724  TROPONINI <0.03 <0.03 0.03*   Lab Results  Component Value Date   WBC 7.1 02/27/2016   HGB 14.5 02/27/2016   HCT 44.2 02/27/2016   MCV 81.0 02/27/2016   PLT 168 02/27/2016     Recent Labs Lab 02/27/16 0152  NA 138  K 3.4*  CL 101  CO2 28  BUN 18  CREATININE 0.68  CALCIUM 8.5*  GLUCOSE 361*   No results found for: CHOL, HDL, LDLCALC, TRIG No results found for: DDIMER   Radiology Studies    Dg Chest 2 View: 02/26/2016  CLINICAL DATA:  65 year old female with acute  shortness of breath and cough. EXAM: CHEST  2 VIEW COMPARISON:  02/27/2014 chest radiograph FINDINGS: Apparent cardiomegaly and left-sided pacemaker again noted. Bilateral pleural effusions are identified with bilateral lower lung atelectasis/ consolidation/ airspace disease. There is no evidence of pneumothorax. No acute bony abnormalities are noted. IMPRESSION: Bilateral pleural effusions with bilateral lower lung atelectasis/ consolidation/airspace disease. Probable cardiomegaly. Electronically Signed   By: Margarette Canada M.D.   On: 02/26/2016 18:53    EKG & Cardiac Imaging    EKG:  None Obtained this admission.  Echocardiogram: 03/2015 FINDINGS Left Ventricle Normal global left ventricular size, systolic function with no obvious regional wall motion abnormalities. Mild left ventricular hypertrophy. Normal left ventricular ejection fraction estimated at 55-60%. Septal E/E' ratio is  15.4 indicating abnormal filling pressure. Abnormal relaxation filling pattern of the left ventricle for age (stage 1 diastolic dysfunction). Right Ventricle Normal right ventricular size and function. Right Atrium Normal right atrial size. Left Atrium Normal left atrial size. Mitral Valve Structurally normal mitral valve. No mitral annular calcification. Trace mitral regurgitation. Aortic Valve Structurally normal trileaflet aortic valve. No aortic regurgitation. Tricuspid Valve Structurally normal tricuspid valve. Trace tricuspid regurgitation. Estimated PASP 28 mmHg. Pulmonic Valve Structurally normal pulmonic valve. No pulmonic regurgitation. Pericardium No pericardial effusion. Vessels Normal size aortic root and proximal ascending aorta. Normal inferior vena cava.  Assessment & Plan    1. Acute CHF Exacerbation (Echo pending to further specify) - presents with gradually worsening orthopnea, dyspnea, and lower extremity edema since 11/2015 (had a viral infection at that time and later required steroids for  Bell's Palsy). These symptoms have acutely progressed over the past 3 days. Reports having gained over 14 pounds in the past 3 weeks (baseline around 120 lbs). No prior history of CHF. Did have an echo in 03/2015 which showed a preserved EF of 55-60% with Grade 1 DD.  -  BNP elevated to 1562 on admission. CXR with bilateral pleural effusions with bilateral lower lung atelectasis/ consolidation. No EKG in EPIC or in the patient's shadow chart for this admission. Will obtain.  - initially placed on BiPAP upon arrival to the ED but now on room air with appropriate oxygen saturations. - started on IV Lasix 40mg  BID with only a net recorded output of -760 mL thus far, however she reports having very frequent urination and overall significant improvement in her symptoms. - repeat echo is pending. She still appears mildly volume overloaded on exam. Would continue with IV diuresis for at least another day then likely transition to PO. Continue to obtain daily weights and I&O's. Discussed with the patient the importance of daily weights along with fluid and sodium restriction.  2. PPM Placement - s/p [employer] PPM in 03/2015 in the setting of recurrent syncope secondary to Mobitz 2 AV Block. Denies any recurrent syncopal events since.  - currently V-paced on telemetry with HR in the 80's.    3. Type 2 DM - glucose elevated to 403 on admission.  - per admitting team.  Signed, Erma Heritage, PA-C 02/27/2016, 11:01 AM Pager: [phone]

## 2016-02-27 NOTE — Progress Notes (Signed)
Pharmacy Antibiotic Note  Bethany Gillespie is a 65 y.o. female admitted on 02/26/2016 with UTI.  Pharmacy has been consulted for ceftriaxone dosing.  Plan: Ceftriaxone 2 grams q 24 hours ordered.  Height: 5\' 2"  (157.5 cm) Weight: 132 lb 4.8 oz (60.011 kg) IBW/kg (Calculated) : 50.1  Temp (24hrs), Avg:98.4 F (36.9 C), Min:97.7 F (36.5 C), Max:99.4 F (37.4 C)   Recent Labs Lab 02/26/16 1858 02/26/16 1918  WBC 7.5  --   CREATININE 0.67  --   LATICACIDVEN  --  1.8    Estimated Creatinine Clearance: 56.9 mL/min (by C-G formula based on Cr of 0.67).    Allergies  Allergen Reactions  . Bee Venom     Antimicrobials this admission: azithromycin  >> ceftriaxone   >>   Dose adjustments this admission:   Microbiology results: 7/16 BCx: pending 7/16 UCx: pending    7/16 UA: LE(-) NO2(+) WBC 0-5  Thank you for allowing pharmacy to be a part of this patient's care.  Little Winton S 02/27/2016 1:37 AM

## 2016-02-28 DIAGNOSIS — R0602 Shortness of breath: Secondary | ICD-10-CM

## 2016-02-28 DIAGNOSIS — E1159 Type 2 diabetes mellitus with other circulatory complications: Secondary | ICD-10-CM

## 2016-02-28 LAB — URINE CULTURE: Culture: NO GROWTH

## 2016-02-28 LAB — GLUCOSE, CAPILLARY
GLUCOSE-CAPILLARY: 131 mg/dL — AB (ref 65–99)
GLUCOSE-CAPILLARY: 129 mg/dL — AB (ref 65–99)
Glucose-Capillary: 100 mg/dL — ABNORMAL HIGH (ref 65–99)
Glucose-Capillary: 325 mg/dL — ABNORMAL HIGH (ref 65–99)

## 2016-02-28 LAB — BASIC METABOLIC PANEL
Anion gap: 7 (ref 5–15)
BUN: 19 mg/dL (ref 6–20)
CHLORIDE: 100 mmol/L — AB (ref 101–111)
CO2: 32 mmol/L (ref 22–32)
CREATININE: 0.61 mg/dL (ref 0.44–1.00)
Calcium: 8.8 mg/dL — ABNORMAL LOW (ref 8.9–10.3)
GFR calc non Af Amer: >60 mL/min (ref >60)
Glucose, Bld: 137 mg/dL — ABNORMAL HIGH (ref 65–99)
POTASSIUM: 3.1 mmol/L — AB (ref 3.5–5.1)
SODIUM: 139 mmol/L (ref 135–145)

## 2016-02-28 LAB — HEMOGLOBIN A1C: Hgb A1c MFr Bld: 12.9 % — ABNORMAL HIGH (ref 4.0–6.0)

## 2016-02-28 MED ORDER — MAGNESIUM OXIDE 400 (241.3 MG) MG PO TABS
400.0000 mg | ORAL_TABLET | Freq: Every day | ORAL | Status: AC
  Administered 2016-02-28 – 2016-02-29 (×2): 400 mg via ORAL
  Filled 2016-02-28 (×2): qty 1

## 2016-02-28 MED ORDER — POTASSIUM CHLORIDE CRYS ER 20 MEQ PO TBCR
40.0000 meq | EXTENDED_RELEASE_TABLET | Freq: Once | ORAL | Status: AC
  Administered 2016-02-28: 40 meq via ORAL
  Filled 2016-02-28: qty 2

## 2016-02-28 NOTE — Progress Notes (Signed)
Inpatient Diabetes Program Recommendations  AACE/ADA: New Consensus Statement on Inpatient Glycemic Control (2015)  Target Ranges:  Prepandial:   less than 140 mg/dL      Peak postprandial:   less than 180 mg/dL (1-2 hours)      Critically ill patients:  140 - 180 mg/dL   Lab Results  Component Value Date   GLUCAP 131* 02/28/2016   HGBA1C 12.0* 02/27/2016   Diabetes history: Diabetes-See's Dr. Gabriel Carina Outpatient Diabetes medications: Levemir 18 units q HS, Novolog 10 units tid with meals Current orders for Inpatient glycemic control:  Levemir 15 units HS, Novolog 5 units tid with meals, and  Novolog moderate tid with meals and HS   Spoke with patient regarding elevated A1C.  She states that this is an improvement from last A1C which was 14%.  She see's Dr. Gabriel Carina.  Patient states that she has not been taking her Levemir consistently due to not having an appetite.  Explained to patient that Levemir is a basal insulin and she likely needs a portion of her dose even when not eating.  Encouraged follow up with Dr. Gabriel Carina and for her to discuss with MD how to adjust medications if not eating versus eating.   Patient verbalized understanding.  Gave patient handout regarding A1C and standards of care. She would also benefit from f/u with diabetes educator.   Thanks Adah Perl, RN, BC-ADM Inpatient Diabetes Coordinator Pager [phone] (8a-5p)

## 2016-02-28 NOTE — Progress Notes (Signed)
Patient: Bethany Gillespie / Admit Date: 02/26/2016 / Date of Encounter: 02/28/2016, 8:52 AM   Subjective: Breathing much better this morning. On room air without SOB. She was able to sleep fully supine overnight without SOB. Only using 1 pillow now. She is minus 1 L for the admission, though notes significant UOP. Potassium 3.1 this morning. She has been given KCl 40 meq x 1 along with her usual potassium.    Review of Systems: Review of Systems  Constitutional: Positive for weight loss and malaise/fatigue. Negative for fever, chills and diaphoresis.  HENT: Negative for congestion.   Eyes: Negative for discharge and redness.  Respiratory: Positive for shortness of breath. Negative for cough, sputum production and wheezing.   Cardiovascular: Positive for leg swelling. Negative for chest pain, palpitations, orthopnea, claudication and PND.  Gastrointestinal: Negative for nausea and vomiting.  Musculoskeletal: Negative for falls.  Skin: Negative for rash.  Neurological: Negative for dizziness, sensory change, speech change, focal weakness, loss of consciousness and weakness.  Endo/Heme/Allergies: Does not bruise/bleed easily.  Psychiatric/Behavioral: The patient is not nervous/anxious.   All other systems reviewed and are negative.   Objective: Telemetry: V paced, 80's bpm Physical Exam: Blood pressure 108/88, pulse 76, temperature 98 F (36.7 C), temperature source Oral, resp. rate 18, height 5\' 2"  (1.575 m), weight 130 lb 3.2 oz (59.058 kg), SpO2 92 %. Body mass index is 23.81 kg/(m^2). General: Well developed, well nourished, in no acute distress. Head: Normocephalic, atraumatic, sclera non-icteric, no xanthomas, nares are without discharge. Neck: Negative for carotid bruits. JVP not elevated. Lungs: Clear bilaterally to auscultation without wheezes, rales, or rhonchi. Breathing is unlabored. Heart: RRR S1 S2 without murmurs, rubs, or gallops.  Abdomen: Soft, non-tender,  non-distended with normoactive bowel sounds. No rebound/guarding. Extremities: No clubbing or cyanosis. Trace per-tibial edema. Distal pedal pulses are 2+ and equal bilaterally. Neuro: Alert and oriented X 3. Moves all extremities spontaneously. Psych:  Responds to questions appropriately with a normal affect.   Intake/Output Summary (Last 24 hours) at 02/28/16 0852 Last data filed at 02/28/16 0848  Gross per 24 hour  Intake    720 ml  Output   2000 ml  Net  -1280 ml    Inpatient Medications:  . antiseptic oral rinse  7 mL Mouth Rinse BID  . carvedilol  3.125 mg Oral BID WC  . cefTRIAXone (ROCEPHIN)  IV  1 g Intravenous Q24H  . enoxaparin (LOVENOX) injection  40 mg Subcutaneous Q24H  . furosemide  40 mg Intravenous BID  . insulin aspart  0-15 Units Subcutaneous TID WC  . insulin aspart  0-5 Units Subcutaneous QHS  . insulin aspart  5 Units Subcutaneous TID WC  . insulin detemir  15 Units Subcutaneous QHS  . lisinopril  40 mg Oral Daily  . magnesium oxide  400 mg Oral Daily  . potassium chloride  20 mEq Oral BID  . potassium chloride  40 mEq Oral Once  . sodium chloride flush  3 mL Intravenous Q12H   Infusions:    Labs:  Recent Labs  02/27/16 0152 02/27/16 0724 02/28/16 0542  NA 138  --  139  K 3.4*  --  3.1*  CL 101  --  100*  CO2 28  --  32  GLUCOSE 361*  --  137*  BUN 18  --  19  CREATININE 0.68  --  0.61  CALCIUM 8.5*  --  8.8*  MG  --  1.8  --  No results for input(s): AST, ALT, ALKPHOS, BILITOT, PROT, ALBUMIN in the last 72 hours.  Recent Labs  02/26/16 1858 02/27/16 0152  WBC 7.5 7.1  NEUTROABS 5.5  --   HGB 14.1 14.5  HCT 42.3 44.2  MCV 82.0 81.0  PLT 176 168    Recent Labs  02/26/16 1858 02/27/16 0152 02/27/16 0724  TROPONINI <0.03 <0.03 0.03*   Invalid input(s): POCBNP  Recent Labs  02/27/16 0152  HGBA1C 12.0*     Weights: Filed Weights   02/26/16 1812 02/27/16 0112 02/28/16 0452  Weight: 125 lb (56.7 kg) 132 lb 4.8 oz  (60.011 kg) 130 lb 3.2 oz (59.058 kg)     Radiology/Studies:  Dg Chest 2 View  02/26/2016  CLINICAL DATA:  65 year old female with acute shortness of breath and cough. EXAM: CHEST  2 VIEW COMPARISON:  02/27/2014 chest radiograph FINDINGS: Apparent cardiomegaly and left-sided pacemaker again noted. Bilateral pleural effusions are identified with bilateral lower lung atelectasis/ consolidation/ airspace disease. There is no evidence of pneumothorax. No acute bony abnormalities are noted. IMPRESSION: Bilateral pleural effusions with bilateral lower lung atelectasis/ consolidation/airspace disease. Probable cardiomegaly. Electronically Signed   By: Margarette Canada M.D.   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 History of Mobitz type II s/p PPM: -Device appears to be functioning well at this time on tele -She would benefit from EP evaluation for possible change-out to BiV ICD or reprogramming to allow for less v pacing  3. HTN: -Stable -Continue antihypertensives as above  Signed, Christell Faith, PA-C [place] Pager: [phone] 02/28/2016, 8:52 AM     Attending Note Patient seen and examined, agree with detailed note above,  Patient presentation and plan discussed on rounds.   Patient reports improve shortness of breath, still not at her baseline Able to lay supine for periods of time Still with significant cough on exam today  Clinical exam with crackles bilaterally halfway up Trace ankle  edema  --- Agree with continued Lasix IV twice a day with close monitoring of potassium Coreg, ACE inhibitor Given continued crackles on exam, cough consistent with acute on chronic systolic CHF, Will reevaluate tomorrow, likely plan on catheterization July 20 Case discussed with Dr. Fletcher Anon who saw the patient on arrival yesterday. He is in agreement Will not perform right heart catheterization given she has pacemaker  Long discussion concerning fluid intake, management of CHF, daily weights, adjustment of diuretics based on her weight --Recommend follow-up in CHF clinic, cardiac rehabilitation   Greater than 50% was spent in counseling and coordination of care with patient Total encounter time 35 minutes or more   Signed: Esmond Plants  M.D., Ph.D. [facility], [facility]

## 2016-02-28 NOTE — Progress Notes (Signed)
Initial Heart Failure Clinic appointment scheduled for March 15, 2016 at 11:00am. Thank you.

## 2016-02-28 NOTE — Progress Notes (Signed)
[place] at [place] NAME: Bethany Gillespie    MR#:  [id number]  DATE OF BIRTH:  [birth year]  SUBJECTIVE:  CHIEF COMPLAINT:   Chief Complaint  Patient presents with  . Shortness of Breath   Better cough and SOB. On O2 [place] 2L. REVIEW OF SYSTEMS:  Review of Systems  Constitutional: Negative for fever and chills.  Eyes: Negative for blurred vision.  Respiratory: Positive for cough and shortness of breath. Negative for hemoptysis and wheezing.   Cardiovascular: no orthopnea and leg swelling. Negative for chest pain and palpitations.  Gastrointestinal: Negative for nausea, vomiting, abdominal pain and diarrhea.  Genitourinary: no  dysuria and frequency. Negative for urgency.  Musculoskeletal: Negative for joint pain.  Neurological: Negative for dizziness, sensory change, speech change, focal weakness, weakness and headaches.  Psychiatric/Behavioral: Negative for depression. The patient is not nervous/anxious.     DRUG ALLERGIES:   Allergies  Allergen Reactions  . Bee Venom    VITALS:  Blood pressure 113/76, pulse 71, temperature 98 F (36.7 C), temperature source Oral, resp. rate 16, height 5\' 2"  (1.575 m), weight 130 lb 3.2 oz (59.058 kg), SpO2 93 %. PHYSICAL EXAMINATION:  Physical Exam  Constitutional: She is oriented to person, place, and time and well-developed, well-nourished, and in no distress.  HENT:  Head: Normocephalic.  Mouth/Throat: Oropharynx is clear and moist.  Eyes: Conjunctivae and EOM are normal.  Neck: Normal range of motion. Neck supple. No JVD present. No tracheal deviation present. No thyromegaly present.  Cardiovascular: Normal rate, regular rhythm, normal heart sounds and intact distal pulses.  Exam reveals no gallop and no friction rub.   No murmur heard. Pulmonary/Chest: No respiratory distress. She has no wheezes. She has mild rales.  Abdominal: She exhibits no distension. There is no tenderness.    Musculoskeletal: Normal range of motion. She exhibits trace edema. She exhibits no tenderness.  Lymphadenopathy:    She has no cervical adenopathy.  Neurological: She is alert and oriented to person, place, and time. She has normal reflexes.  Skin: Skin is dry. No rash noted. No erythema. No pallor.  Psychiatric: Mood, memory, affect and judgment normal.   LABORATORY PANEL:   CBC  Recent Labs Lab 02/27/16 0152  WBC 7.1  HGB 14.5  HCT 44.2  PLT 168   ------------------------------------------------------------------------------------------------------------------ Chemistries   Recent Labs Lab 02/27/16 0724 02/28/16 0542  NA  --  139  K  --  3.1*  CL  --  100*  CO2  --  32  GLUCOSE  --  137*  BUN  --  19  CREATININE  --  0.61  CALCIUM  --  8.8*  MG 1.8  --    RADIOLOGY:  No results found. ASSESSMENT AND PLAN:   Acute systolic CHF (congestive heart failure) LV EF: 25% - 30%.  Continue lasix, lisinopril and coreg. Cardiac cath tomorrow per cardiologist.  Acute respiratory failure with hypoxia. off the BiPAP,  Try to wean off O2 [place], neb prn.   Pleural effusion, bilateral - likely due to her heart failure, see above for treatment.   Hypokalemia. Given Po KCl.   UTI - nitrite positive on UA, IV rocephin, f/u urine culture.    Diabetes ([place]) - uncontrolled, add levemir 15 units HS, novolog 5 units AC, continue sliding scale insulin with corresponding glucose checks and carb modified diet   HTN (hypertension) - currently stable, continue home antihypertensives  All the records are reviewed and case  discussed with Care Management/Social Worker. Management plans discussed with the patient, her son and they are in agreement.  CODE STATUS: full code.  TOTAL TIME TAKING CARE OF THIS PATIENT: 35 minutes.   More than 50% of the time was spent in counseling/coordination of care: YES  POSSIBLE D/C IN 2 DAYS, DEPENDING ON CLINICAL CONDITION.   Demetrios Loll M.D  on 02/28/2016 at 1:47 PM  Between 7am to 6pm - Pager - [phone]  After 6pm go to [email or web address] - Technical bre[employer] [facility]  Office  [phone]  CC: Primary care physician; No PCP Per Patient  Note: This dictation was prepared with Dragon dictation along with smaller phrase technology. Any transcriptional errors that result from this process are unintentional.

## 2016-02-29 DIAGNOSIS — I441 Atrioventricular block, second degree: Secondary | ICD-10-CM

## 2016-02-29 DIAGNOSIS — I5043 Acute on chronic combined systolic (congestive) and diastolic (congestive) heart failure: Secondary | ICD-10-CM

## 2016-02-29 DIAGNOSIS — Z950 Presence of cardiac pacemaker: Secondary | ICD-10-CM | POA: Diagnosis present

## 2016-02-29 DIAGNOSIS — I10 Essential (primary) hypertension: Secondary | ICD-10-CM

## 2016-02-29 LAB — GLUCOSE, CAPILLARY
GLUCOSE-CAPILLARY: 121 mg/dL — AB (ref 65–99)
GLUCOSE-CAPILLARY: 107 mg/dL — AB (ref 65–99)
GLUCOSE-CAPILLARY: 324 mg/dL — AB (ref 65–99)
Glucose-Capillary: 100 mg/dL — ABNORMAL HIGH (ref 65–99)
Glucose-Capillary: 119 mg/dL — ABNORMAL HIGH (ref 65–99)
Glucose-Capillary: 68 mg/dL (ref 65–99)

## 2016-02-29 LAB — BASIC METABOLIC PANEL
Anion gap: 7 (ref 5–15)
BUN: 20 mg/dL (ref 6–20)
CALCIUM: 8.8 mg/dL — AB (ref 8.9–10.3)
CHLORIDE: 102 mmol/L (ref 101–111)
CO2: 31 mmol/L (ref 22–32)
CREATININE: 0.6 mg/dL (ref 0.44–1.00)
Glucose, Bld: 119 mg/dL — ABNORMAL HIGH (ref 65–99)
Potassium: 3.7 mmol/L (ref 3.5–5.1)
SODIUM: 140 mmol/L (ref 135–145)

## 2016-02-29 LAB — MAGNESIUM: MAGNESIUM: 2 mg/dL (ref 1.7–2.4)

## 2016-02-29 MED ORDER — SODIUM CHLORIDE 0.9 % IV SOLN
INTRAVENOUS | Status: DC
  Administered 2016-03-01: 06:00:00 via INTRAVENOUS

## 2016-02-29 MED ORDER — SODIUM CHLORIDE 0.9 % WEIGHT BASED INFUSION
1.0000 mL/kg/h | INTRAVENOUS | Status: DC
  Administered 2016-03-01: 1 mL/kg/h via INTRAVENOUS

## 2016-02-29 MED ORDER — ASPIRIN 81 MG PO CHEW
81.0000 mg | CHEWABLE_TABLET | ORAL | Status: AC
  Administered 2016-03-01: 81 mg via ORAL
  Filled 2016-02-29: qty 1

## 2016-02-29 MED ORDER — SODIUM CHLORIDE 0.9 % IV SOLN: 250.0000 mL | INTRAVENOUS | Status: DC | PRN

## 2016-02-29 MED ORDER — SODIUM CHLORIDE 0.9 % WEIGHT BASED INFUSION
3.0000 mL/kg/h | INTRAVENOUS | Status: AC
  Administered 2016-03-01: 3 mL/kg/h via INTRAVENOUS

## 2016-02-29 NOTE — Progress Notes (Signed)
Patient: Bethany Gillespie / Admit Date: 02/26/2016 / Date of Encounter: 02/29/2016, 7:30 AM   Subjective: Minus 2.4 L for the admission and 1.35 L for the past 24 hours. Breathing continues to improve. Renal function stable. Potassium improving. Magnesium at goal.   Review of Systems: Review of Systems  Constitutional: Positive for weight loss and malaise/fatigue. Negative for fever, chills and diaphoresis.  HENT: Negative for congestion.   Eyes: Negative for discharge and redness.  Respiratory: Positive for shortness of breath. Negative for cough, sputum production and wheezing.        SOB improving  Cardiovascular: Positive for leg swelling. Negative for chest pain, palpitations, orthopnea, claudication and PND.       LE edema improving  Gastrointestinal: Negative for nausea and vomiting.  Musculoskeletal: Negative for falls.  Skin: Negative for rash.  Neurological: Negative for dizziness, sensory change, speech change, focal weakness, loss of consciousness and weakness.  Endo/Heme/Allergies: Does not bruise/bleed easily.  Psychiatric/Behavioral: The patient is not nervous/anxious.   All other systems reviewed and are negative.   Objective: Telemetry: V paced, 70's bpm Physical Exam: Blood pressure 109/62, pulse 79, temperature 97.9 F (36.6 C), temperature source Oral, resp. rate 16, height 5\' 2"  (1.575 m), weight 127 lb 12.8 oz (57.97 kg), SpO2 95 %. Body mass index is 23.37 kg/(m^2). General: Well developed, well nourished, in no acute distress. Head: Normocephalic, atraumatic, sclera non-icteric, no xanthomas, nares are without discharge. Neck: Negative for carotid bruits. JVP not elevated. Lungs: Faint crackles along bilateral bases, L>R. Breathing is unlabored, on room air. Heart: RRR S1 S2 without murmurs, rubs, or gallops.  Abdomen: Soft, non-tender, non-distended with normoactive bowel sounds. No rebound/guarding. Extremities: No clubbing or cyanosis. Trace per-tibial  edema. Distal pedal pulses are 2+ and equal bilaterally. Neuro: Alert and oriented X 3. Moves all extremities spontaneously. Psych:  Responds to questions appropriately with a normal affect.   Intake/Output Summary (Last 24 hours) at 02/29/16 0730 Last data filed at 02/29/16 0433  Gross per 24 hour  Intake    600 ml  Output   1950 ml  Net  -1350 ml    Inpatient Medications:  . antiseptic oral rinse  7 mL Mouth Rinse BID  . carvedilol  3.125 mg Oral BID WC  . cefTRIAXone (ROCEPHIN)  IV  1 g Intravenous Q24H  . enoxaparin (LOVENOX) injection  40 mg Subcutaneous Q24H  . furosemide  40 mg Intravenous BID  . insulin aspart  0-15 Units Subcutaneous TID WC  . insulin aspart  0-5 Units Subcutaneous QHS  . insulin aspart  5 Units Subcutaneous TID WC  . insulin detemir  15 Units Subcutaneous QHS  . lisinopril  40 mg Oral Daily  . magnesium oxide  400 mg Oral Daily  . potassium chloride  20 mEq Oral BID  . sodium chloride flush  3 mL Intravenous Q12H   Infusions:    Labs:  Recent Labs  02/27/16 0724 02/28/16 0542 02/29/16 0559  NA  --  139 140  K  --  3.1* 3.7  CL  --  100* 102  CO2  --  32 31  GLUCOSE  --  137* 119*  BUN  --  19 20  CREATININE  --  0.61 0.60  CALCIUM  --  8.8* 8.8*  MG 1.8  --  2.0   No results for input(s): AST, ALT, ALKPHOS, BILITOT, PROT, ALBUMIN in the last 72 hours.  Recent Labs  02/26/16 1858 02/27/16 0152  WBC 7.5 7.1  NEUTROABS 5.5  --   HGB 14.1 14.5  HCT 42.3 44.2  MCV 82.0 81.0  PLT 176 168    Recent Labs  02/26/16 1858 02/27/16 0152 02/27/16 0724  TROPONINI <0.03 <0.03 0.03*   Invalid input(s): POCBNP  Recent Labs  02/28/16 0542  HGBA1C 12.9*     Weights: Filed Weights   02/27/16 0112 02/28/16 0452 02/29/16 0436  Weight: 132 lb 4.8 oz (60.011 kg) 130 lb 3.2 oz (59.058 kg) 127 lb 12.8 oz (57.97 kg)     Radiology/Studies:  Dg Chest 2 View  02/26/2016  CLINICAL DATA:  65 year old female with acute shortness of breath  and cough. EXAM: CHEST  2 VIEW COMPARISON:  02/27/2014 chest radiograph FINDINGS: Apparent cardiomegaly and left-sided pacemaker again noted. Bilateral pleural effusions are identified with bilateral lower lung atelectasis/ consolidation/ airspace disease. There is no evidence of pneumothorax. No acute bony abnormalities are noted. IMPRESSION: Bilateral pleural effusions with bilateral lower lung atelectasis/ consolidation/airspace disease. Probable cardiomegaly. Electronically Signed   By: Margarette Canada M.D.   On: 02/26/2016 18:53     Assessment and Plan  Principal Problem:   Acute on chronic combined systolic and diastolic CHF, NYHA class 2 (HCC) Active Problems:   Pleural effusion, bilateral   Shortness of breath   Mobitz type II atrioventricular block   Status post placement of cardiac pacemaker   Diabetes ([place])   HTN (hypertension)   UTI (urinary tract infection)    1.Acut on chronic combined CHF: -SOB is much improved on IV Lasix -Continue IV Lasix for today with KCl repletion -Possible transition to PO Lasix s/p cardiac cath -Continue Coreg and lisinopril -Schedule LHC for 7/20  -Plan to add spironolactone s/p cardiac catheterization on 7/20 -Limit PO fluids and salt -Echo with EF 25-30%  2. History of Mobitz type II s/p PPM: -Device appears to be functioning well at this time on tele -She would benefit from EP evaluation for possible change-out to BiV ICD or reprogramming to allow for less v pacing  3. HTN: -Stable -Continue antihypertensives as above  4. Remaining per IM  Signed, Christell Faith, PA-C [place] Pager: [phone] 02/29/2016, 7:30 AM

## 2016-02-29 NOTE — Progress Notes (Signed)
[place] at [place] NAME: Bethany Gillespie    MR#:  [id number]  DATE OF BIRTH:  1951/04/03  SUBJECTIVE:  CHIEF COMPLAINT:   Chief Complaint  Patient presents with  . Shortness of Breath   Better cough and no SOB. Off O2 [place] REVIEW OF SYSTEMS:  Review of Systems  Constitutional: Negative for fever and chills.  Eyes: Negative for blurred vision.  Respiratory: Positive for cough and no shortness of breath. Negative for hemoptysis and wheezing.   Cardiovascular: no orthopnea and leg swelling. Negative for chest pain and palpitations.  Gastrointestinal: Negative for nausea, vomiting, abdominal pain and diarrhea.  Genitourinary: no  dysuria and frequency. Negative for urgency.  Musculoskeletal: Negative for joint pain.  Neurological: Negative for dizziness, sensory change, speech change, focal weakness, weakness and headaches.  Psychiatric/Behavioral: Negative for depression. The patient is not nervous/anxious.     DRUG ALLERGIES:   Allergies  Allergen Reactions  . Bee Venom    VITALS:  Blood pressure 99/57, pulse 72, temperature 98.2 F (36.8 C), temperature source Oral, resp. rate 20, height 5\' 2"  (1.575 m), weight 127 lb 12.8 oz (57.97 kg), SpO2 94 %. PHYSICAL EXAMINATION:  Physical Exam  Constitutional: She is oriented to person, place, and time and well-developed, well-nourished, and in no distress.  HENT:  Head: Normocephalic.  Mouth/Throat: Oropharynx is clear and moist.  Eyes: Conjunctivae and EOM are normal.  Neck: Normal range of motion. Neck supple. No JVD present. No tracheal deviation present. No thyromegaly present.  Cardiovascular: Normal rate, regular rhythm, normal heart sounds and intact distal pulses.  Exam reveals no gallop and no friction rub.   No murmur heard. Pulmonary/Chest: No respiratory distress. She has no wheezes. She has mild rales.  Abdominal: She exhibits no distension. There is no tenderness.    Musculoskeletal: Normal range of motion. She exhibits no edema. She exhibits no tenderness.  Lymphadenopathy:    She has no cervical adenopathy.  Neurological: She is alert and oriented to person, place, and time. She has normal reflexes.  Skin: Skin is dry. No rash noted. No erythema. No pallor.  Psychiatric: Mood, memory, affect and judgment normal.   LABORATORY PANEL:   CBC  Recent Labs Lab 02/27/16 0152  WBC 7.1  HGB 14.5  HCT 44.2  PLT 168   ------------------------------------------------------------------------------------------------------------------ Chemistries   Recent Labs Lab 02/29/16 0559  NA 140  K 3.7  CL 102  CO2 31  GLUCOSE 119*  BUN 20  CREATININE 0.60  CALCIUM 8.8*  MG 2.0   RADIOLOGY:  No results found. ASSESSMENT AND PLAN:   Acute systolic CHF (congestive heart failure) LV EF: 25% - 30%.  Continue lasix, lisinopril and coreg. Cardiac cath tomorrow per cardiologist.  Acute respiratory failure with hypoxia. off the BiPAP, weaned off O2 [place], neb prn.   Pleural effusion, bilateral - likely due to her heart failure, see above for treatment.   Hypokalemia. Given Po KCl. Improved.   UTI - nitrite positive on UA, d/c IV rocephin, negative urine culture.    Diabetes ([place]) - uncontrolled, added levemir 15 units HS, novolog 5 units AC, continue sliding scale insulin with corresponding glucose checks and carb modified diet   HTN (hypertension) - currently stable, continue home antihypertensives  All the records are reviewed and case discussed with Care Management/Social Worker. Management plans discussed with the patient, her son and they are in agreement.  CODE STATUS: full code.  TOTAL TIME TAKING CARE OF  THIS PATIENT: 33 minutes.   More than 50% of the time was spent in counseling/coordination of care: YES  POSSIBLE D/C IN 1-2 DAYS, DEPENDING ON CLINICAL CONDITION.   Demetrios Loll M.D on 02/29/2016 at 2:45 PM  Between 7am to 6pm -  Pager - [phone]  After 6pm go to [email or web address] - Technical brewer [facility]  Office  [phone]  CC: Primary care physician; No PCP Per Patient  Note: This dictation was prepared with Dragon dictation along with smaller phrase technology. Any transcriptional errors that result from this process are unintentional.

## 2016-02-29 NOTE — Progress Notes (Signed)
Patient feeling like her blood sugar was dropping - CBG 68. Snack given. CBG now 120's. Patient resting quietly no complaints. Will continue to monitor.

## 2016-02-29 NOTE — Care Management (Signed)
New onset CHF. Cath scheduled for tomorrow. Patient has a new patient appointment with HF clinic. She states she has no PCP but is going to call [facility]. Pharmacy: [facility], [address]  She lives at home with her daughter. Patient is independent, active and drives. Uses no home O2 or DME.

## 2016-02-29 NOTE — Care Management (Signed)
Provided patient with a set of scales.

## 2016-03-01 ENCOUNTER — Telehealth: Payer: Self-pay | Admitting: Cardiovascular Disease

## 2016-03-01 ENCOUNTER — Encounter
Admission: EM | Disposition: A | Discharge status: Home / Self Care | Payer: Self-pay | Source: Home / Self Care | Attending: Internal Medicine

## 2016-03-01 ENCOUNTER — Encounter: Payer: Self-pay | Admitting: Cardiovascular Disease

## 2016-03-01 DIAGNOSIS — I2510 Atherosclerotic heart disease of native coronary artery without angina pectoris: Secondary | ICD-10-CM

## 2016-03-01 HISTORY — PX: CARDIAC CATHETERIZATION: SHX172

## 2016-03-01 LAB — GLUCOSE, CAPILLARY: GLUCOSE-CAPILLARY: 112 mg/dL — AB (ref 65–99)

## 2016-03-01 SURGERY — LEFT HEART CATH AND CORONARY ANGIOGRAPHY
Anesthesia: Moderate Sedation

## 2016-03-01 SURGERY — LEFT HEART CATH AND CORONARY ANGIOGRAPHY
Anesthesia: Moderate Sedation | Laterality: Bilateral

## 2016-03-01 MED ORDER — FUROSEMIDE 40 MG PO TABS: 40.0000 mg | ORAL_TABLET | Freq: Two times a day (BID) | ORAL | Status: DC

## 2016-03-01 MED ORDER — SPIRONOLACTONE 25 MG PO TABS: 25.0000 mg | ORAL_TABLET | Freq: Every day | ORAL | Status: DC

## 2016-03-01 MED ORDER — SPIRONOLACTONE 25 MG PO TABS
25.0000 mg | ORAL_TABLET | Freq: Every day | ORAL | Status: DC
  Administered 2016-03-01: 25 mg via ORAL
  Filled 2016-03-01: qty 1

## 2016-03-01 MED ORDER — MIDAZOLAM HCL 2 MG/2ML IJ SOLN
INTRAMUSCULAR | Status: DC | PRN
  Administered 2016-03-01: 1 mg via INTRAVENOUS

## 2016-03-01 MED ORDER — ACETAMINOPHEN 325 MG PO TABS: 650.0000 mg | ORAL_TABLET | ORAL | Status: DC | PRN

## 2016-03-01 MED ORDER — MIDAZOLAM HCL 2 MG/2ML IJ SOLN
INTRAMUSCULAR | Status: AC
  Filled 2016-03-01: qty 2

## 2016-03-01 MED ORDER — FENTANYL CITRATE (PF) 100 MCG/2ML IJ SOLN
INTRAMUSCULAR | Status: AC
  Filled 2016-03-01: qty 2

## 2016-03-01 MED ORDER — LIVING WELL WITH DIABETES BOOK
Freq: Once | Status: AC
  Administered 2016-03-01: 15:00:00
  Filled 2016-03-01: qty 1

## 2016-03-01 MED ORDER — SACUBITRIL-VALSARTAN 24-26 MG PO TABS: 1.0000 | ORAL_TABLET | Freq: Two times a day (BID) | ORAL | Status: DC

## 2016-03-01 MED ORDER — CARVEDILOL 3.125 MG PO TABS: 3.1250 mg | ORAL_TABLET | Freq: Two times a day (BID) | ORAL | Status: DC

## 2016-03-01 MED ORDER — HEPARIN (PORCINE) IN NACL 2-0.9 UNIT/ML-% IJ SOLN
INTRAMUSCULAR | Status: AC
  Filled 2016-03-01: qty 1000

## 2016-03-01 MED ORDER — SODIUM CHLORIDE 0.9 % WEIGHT BASED INFUSION
3.0000 mL/kg/h | INTRAVENOUS | Status: AC
  Administered 2016-03-01: 3 mL/kg/h via INTRAVENOUS

## 2016-03-01 MED ORDER — ONDANSETRON HCL 4 MG/2ML IJ SOLN: 4.0000 mg | Freq: Four times a day (QID) | INTRAMUSCULAR | Status: DC | PRN

## 2016-03-01 MED ORDER — POTASSIUM CHLORIDE CRYS ER 20 MEQ PO TBCR: 20.0000 meq | EXTENDED_RELEASE_TABLET | Freq: Every day | ORAL | Status: DC

## 2016-03-01 MED ORDER — SACUBITRIL-VALSARTAN 24-26 MG PO TABS
1.0000 | ORAL_TABLET | Freq: Two times a day (BID) | ORAL | Status: DC
  Administered 2016-03-01: 1 via ORAL
  Filled 2016-03-01 (×2): qty 1

## 2016-03-01 MED ORDER — FENTANYL CITRATE (PF) 100 MCG/2ML IJ SOLN
INTRAMUSCULAR | Status: DC | PRN
  Administered 2016-03-01: 25 ug via INTRAVENOUS

## 2016-03-01 SURGICAL SUPPLY — 9 items
CATH INFINITI 5FR ANG PIGTAIL (CATHETERS) ×3 IMPLANT
CATH INFINITI 5FR JL4 (CATHETERS) ×3 IMPLANT
CATH INFINITI JR4 5F (CATHETERS) ×3 IMPLANT
DEVICE CLOSURE MYNXGRIP 5F (Vascular Products) ×3 IMPLANT
KIT MANI 3VAL PERCEP (MISCELLANEOUS) ×3 IMPLANT
NEEDLE PERC 18GX7CM (NEEDLE) ×3 IMPLANT
PACK CARDIAC CATH (CUSTOM PROCEDURE TRAY) ×3 IMPLANT
SHEATH AVANTI 5FR X 11CM (SHEATH) ×3 IMPLANT
WIRE EMERALD 3MM-J .035X150CM (WIRE) ×3 IMPLANT

## 2016-03-01 NOTE — Progress Notes (Signed)
Inpatient Diabetes Program Recommendations  AACE/ADA: New Consensus Statement on Inpatient Glycemic Control (2015)  Target Ranges:  Prepandial:   less than 140 mg/dL      Peak postprandial:   less than 180 mg/dL (1-2 hours)      Critically ill patients:  140 - 180 mg/dL   Lab Results  Component Value Date   GLUCAP 112* 03/01/2016   HGBA1C 12.9* 02/28/2016    Spoke with patient regarding her diabetes.  She has a scheduled appointment with Dr. Gabriel Carina in August.  Confirms she is checking blood sugars 4x/day and brings her meter to all her MD visits. She takes her Novolog three times a day with meals as ordered.  She did report that she spoke to Adah Perl- diabetes coordinator yesterday and now understands that she needs to take some Levemir each night, even if her blood sugars seem too low to take any (she was sometimes skipping Levemir ).  We reviewed the treatment of low blood sugars- she keeps lifesavers with her at all times.  She says she uses orange juice or lifesavers to treat low blood sugars and says she can tell when it is getting low.  I recommended she eat a snack if she goes long periods between meals to avoid low blood sugars.  I have instructed her to try and keep meals about 4 hours apart to avoid low blood sugars since Novolog is typically active for at least four hours. Patient asked for dietitian education- I therefore ordered an inpatient consult and outpatient follow up, plus diabetes education since she has never had diabetes education.   Gentry Fitz, RN, BA, MHA, CDE Diabetes Coordinator Inpatient Diabetes Program  [phone] (Team Pager) [phone] ([place]) 03/01/2016 2:14 PM

## 2016-03-01 NOTE — Progress Notes (Signed)
Pt discharged to home via wc.  Instructions given to pt, RX for Coreg, Lasix, Potassium, Entresto and Spirolactone escribed to [facility] on [address].  Questions answered.  No distress.

## 2016-03-01 NOTE — Plan of Care (Signed)
Problem: Food- and Nutrition-Related Knowledge Deficit (NB-1.1) Goal: Nutrition education Formal process to instruct or train a patient/client in a skill or to impart knowledge to help patients/clients voluntarily manage or modify food choices and eating behavior to maintain or improve health. Outcome: Completed/Met Date Met:  03/01/16  RD consulted for nutrition education regarding diabetes.  Pt reports she was diagnosed with DM 3-4 years ago but had no education regarding diabetic diet    Lab Results  Component Value Date    HGBA1C 12.9* 02/28/2016    RD provided "Carbohydrate Counting for People with Diabetes" handout from the Academy of Nutrition and Dietetics. Discussed different food groups and their effects on blood sugar, emphasizing carbohydrate-containing foods. Provided list of carbohydrates and recommended serving sizes of common foods.  Discussed importance of controlled and consistent carbohydrate intake throughout the day. Provided examples of ways to balance meals/snacks and encouraged intake of high-fiber, whole grain complex carbohydrates. Teach back method used.  Expect good compliance. Pt also has order for outpatient education for nutrition and diabetes placed by MD at discharge.   Body mass index is 23.04 kg/(m^2).   Pt being discharged today. Labs and medications reviewed. No further nutrition interventions warranted at this time. RD contact information provided. If additional nutrition issues arise prior to discharge, please re-consult RD.  Kerman Passey [place], [place], LDN [phone] Pager  [phone] Weekend/On-Call Pager

## 2016-03-01 NOTE — Discharge Summary (Signed)
[place] [facility] [place] NAME: Bethany Gillespie    MR#:  [id number]  DATE OF BIRTH:  06-29-1951  DATE OF ADMISSION:  02/26/2016 ADMITTING PHYSICIAN: Lance Coon, MD  DATE OF DISCHARGE: 03/01/2016  PRIMARY CARE PHYSICIAN: No PCP Per Patient    ADMISSION DIAGNOSIS:  Shortness of breath [R06.02] Congestive heart failure, unspecified congestive heart failure chronicity, unspecified congestive heart failure type ([place]) [I50.9]  DISCHARGE DIAGNOSIS:  Principal Problem:   Acute on chronic combined systolic and diastolic CHF, NYHA class 2 (HCC) Active Problems:   Diabetes (HCC)   HTN (hypertension)   Pleural effusion, bilateral   UTI (urinary tract infection)   Shortness of breath   Mobitz type II atrioventricular block   Status post placement of cardiac pacemaker   SECONDARY DIAGNOSIS:   Past Medical History  Diagnosis Date  . Hernia of abdominal wall   . Diabetes ([place])   . Bradycardia   . AV block, Mobitz 2     a. s/p Engineer, agricultural PPM in 03/2015  . HTN (hypertension)   . Chronic diastolic (congestive) heart failure (HCC)     a. echo in 03/2015: EF 55-60%, Grade 1 DD.     HOSPITAL COURSE:   Acute systolic and diastolic CHF (congestive heart failure) LV EF: 25% - 30%. Continue lasix 40 mg bid and coreg, discontined lisinopril, start entresto and aldactone per Dr. Rockey Situ. Cardiac cath: Cardiac catheterization with no significant coronary artery disease. Mild proximal LAD disease 30%.  Acute respiratory failure with hypoxia. Improved. off the BiPAP, weaned off O2 [place], neb prn.   Pleural effusion, bilateral - likely due to her heart failure, see above for treatment.   Hypokalemia. Given Po KCl. Improved.   UTI - nitrite positive on UA, discontinued IV rocephin, negative urine culture.    Diabetes ([place]) - uncontrolled, added levemir 15 units HS, novolog 5 units AC, continue sliding scale insulin with  corresponding glucose checks and carb modified diet   HTN (hypertension) - currently stable, continue home antihypertensives  I discussed with cardiologist.  DISCHARGE CONDITIONS:   Stable, discharge to home today.  CONSULTS OBTAINED:  Treatment Team:  Wellington Hampshire, MD  DRUG ALLERGIES:   Allergies  Allergen Reactions  . Bee Venom     DISCHARGE MEDICATIONS:   Current Discharge Medication List    START taking these medications   Details  carvedilol (COREG) 3.125 MG tablet Take 1 tablet (3.125 mg total) by mouth 2 (two) times daily with a meal. Qty: 60 tablet, Refills: 2    furosemide (LASIX) 40 MG tablet Take 1 tablet (40 mg total) by mouth 2 (two) times daily. Qty: 60 tablet, Refills: 0    potassium chloride SA (K-DUR,KLOR-CON) 20 MEQ tablet Take 1 tablet (20 mEq total) by mouth daily. Qty: 14 tablet, Refills: 0    sacubitril-valsartan (ENTRESTO) 24-26 MG Take 1 tablet by mouth 2 (two) times daily. Qty: 60 tablet, Refills: 0    spironolactone (ALDACTONE) 25 MG tablet Take 1 tablet (25 mg total) by mouth daily. Qty: 30 tablet, Refills: 0      CONTINUE these medications which have NOT CHANGED   Details  insulin aspart (NOVOLOG) 100 UNIT/ML injection Inject 10 Units into the skin 3 (three) times daily with meals.    LEVEMIR FLEXTOUCH 100 UNIT/ML Pen Inject 18 Units into the skin every evening.      STOP taking these medications     lisinopril (PRINIVIL,ZESTRIL) 40  MG tablet          DISCHARGE INSTRUCTIONS:    DIET:  Heart healthy and ADA diet.  DISCHARGE CONDITION:  Stable.  ACTIVITY:  As tolerated.  OXYGEN:  Home Oxygen: no   Oxygen Delivery: no.  DISCHARGE LOCATION:    If you experience worsening of your admission symptoms, develop shortness of breath, life threatening emergency, suicidal or homicidal thoughts you must seek medical attention immediately by calling 911 or calling your MD immediately  if symptoms less severe.  You Must  read complete instructions/literature along with all the possible adverse reactions/side effects for all the Medicines you take and that have been prescribed to you. Take any new Medicines after you have completely understood and accpet all the possible adverse reactions/side effects.   Please note  You were cared for by a hospitalist during your hospital stay. If you have any questions about your discharge medications or the care you received while you were in the hospital after you are discharged, you can call the unit and asked to speak with the hospitalist on call if the hospitalist that took care of you is not available. Once you are discharged, your primary care physician will handle any further medical issues. Please note that NO REFILLS for any discharge medications will be authorized once you are discharged, as it is imperative that you return to your primary care physician (or establish a relationship with a primary care physician if you do not have one) for your aftercare needs so that they can reassess your need for medications and monitor your lab values.    On the day of Discharge:  No complaint. VITAL SIGNS:  Blood pressure 131/69, pulse 75, temperature 98 F (36.7 C), temperature source Oral, resp. rate 16, height 5\' 2"  (1.575 m), weight 126 lb (57.153 kg), SpO2 95 %.  PHYSICAL EXAMINATION:  GENERAL:  65 y.o.-year-old patient lying in the bed with no acute distress.  EYES: Pupils equal, round, reactive to light and accommodation. No scleral icterus. Extraocular muscles intact.  HEENT: Head atraumatic, normocephalic. Oropharynx and nasopharynx clear.  NECK:  Supple, no jugular venous distention. No thyroid enlargement, no tenderness.  LUNGS: Normal breath sounds bilaterally, no wheezing, rales,rhonchi or crepitation. No use of accessory muscles of respiration.  CARDIOVASCULAR: S1, S2 normal. No murmurs, rubs, or gallops.  ABDOMEN: Soft, non-tender, non-distended. Bowel sounds  present. No organomegaly or mass.  EXTREMITIES: No pedal edema, cyanosis, or clubbing.  NEUROLOGIC: Cranial nerves II through XII are intact. Muscle strength 5/5 in all extremities. Sensation intact. Gait not checked.  PSYCHIATRIC: The patient is alert and oriented x 3.  SKIN: No obvious rash, lesion, or ulcer.  DATA REVIEW:   CBC  Recent Labs Lab 02/27/16 0152  WBC 7.1  HGB 14.5  HCT 44.2  PLT 168    Chemistries   Recent Labs Lab 02/29/16 0559  NA 140  K 3.7  CL 102  CO2 31  GLUCOSE 119*  BUN 20  CREATININE 0.60  CALCIUM 8.8*  MG 2.0    Cardiac Enzymes  Recent Labs Lab 02/27/16 0724  TROPONINI 0.03*    Microbiology Results  Results for orders placed or performed during the hospital encounter of 02/26/16  Blood Culture (routine x 2)     Status: None (Preliminary result)   Collection Time: 02/26/16  7:18 PM  Result Value Ref Range Status   Specimen Description BLOOD LEFT ASSIST CONTROL  Final   Special Requests   Final  BOTTLES DRAWN AEROBIC AND ANAEROBIC 10CCAERO,8CCANA   Culture NO GROWTH 4 DAYS  Final   Report Status PENDING  Incomplete  Blood Culture (routine x 2)     Status: None (Preliminary result)   Collection Time: 02/26/16  7:18 PM  Result Value Ref Range Status   Specimen Description BLOOD RIGHT FOREARM  Final   Special Requests BOTTLES DRAWN AEROBIC AND ANAEROBIC [place]  Final   Culture NO GROWTH 4 DAYS  Final   Report Status PENDING  Incomplete  Urine culture     Status: None   Collection Time: 02/26/16 10:18 PM  Result Value Ref Range Status   Specimen Description URINE, RANDOM  Final   Special Requests NONE  Final   Culture NO GROWTH Performed at [facility]   Final   Report Status 02/28/2016 FINAL  Final    RADIOLOGY:  No results found.   Management plans discussed with the patient, family and they are in agreement.  CODE STATUS:     Code Status Orders        Start     Ordered   02/27/16 0123  Full  code   Continuous     02/27/16 0122    Code Status History    Date Active Date Inactive Code Status Order ID Comments User Context   This patient has a current code status but no historical code status.    Advance Directive Documentation        Most Recent Value   Type of Advance Directive  Healthcare Power of Attorney   Pre-existing out of facility DNR order (yellow form or pink MOST form)     "MOST" Form in Place?        TOTAL TIME TAKING CARE OF THIS PATIENT: 37 minutes.    Demetrios Loll M.D on 03/01/2016 at 12:41 PM  Between 7am to 6pm - Pager - [phone]  After 6pm go to [email or web address] - password [email or web address] [facility]  [facility]  Office  [phone]  CC: Primary care physician; No PCP Per Patient   Note: This dictation was prepared with Dragon dictation along with smaller phrase technology. Any transcriptional errors that result from this process are unintentional.

## 2016-03-01 NOTE — Progress Notes (Signed)
To cath lab via bed.

## 2016-03-01 NOTE — Progress Notes (Signed)
Pt clinically stabl e post heart cath, no bleeding nor hematoma at right groin site, vss. vpaced per monitor, denies complaints, repolrt called to [place] with plan reviewed.

## 2016-03-01 NOTE — Progress Notes (Signed)
Pt returned from cath lab s/p lt heart cath.  A&O x 3.  Bedrest until 1200.  Cardiac monitor in place, pt denies chest pain.  Dressing to rt groin dry intact, pulses equal bilaterally.  IVF infusing well lt ac.  Denies need at this time. CB in reach, SR up x 2.

## 2016-03-01 NOTE — Discharge Instructions (Signed)
Heart Failure Clinic appointment on March 15, 2016 at 11:00am with Darylene Price, [place]. Please call [phone] to reschedule.  Heart healthy and ADA diet. Activity as tolerated.

## 2016-03-01 NOTE — Telephone Encounter (Signed)
8/2 2:30  Murray Hodgkins, NP  Patient contacted regarding discharge from [facility] on 7/20.  Patient understands to follow up with provider Murray Hodgkins on 03/14/16 at 2:30pm at [facility]. Patient understands discharge instructions? yes Patient understands medications and regiment? yes Patient understands to bring all medications to this visit? yes

## 2016-03-01 NOTE — Progress Notes (Signed)
Cardiac catheterization with no significant coronary artery disease Mild proximal LAD disease 30% There is severe disease of a small diagonal vessel likely of little clinical significance Nonischemic cardiomyopathy Ejection fraction less than 25%, global hypokinesis on LV gram  Would continue carvedilol, Consider holding lisinopril, starting entresto 49/50 mg po bid Also would add Aldactone 25 mg daily Lasix 40 mg twice a day by mouth with potassium 20 daily  She'll need close follow-up in cardiology clinic  Needs aggressive intervention on her diabetes  Signed, Esmond Plants, MD, Ph.D [facility]

## 2016-03-01 NOTE — Progress Notes (Signed)
Inpatient Diabetes Program Recommendations  AACE/ADA: New Consensus Statement on Inpatient Glycemic Control (2015)  Target Ranges:  Prepandial:   less than 140 mg/dL      Peak postprandial:   less than 180 mg/dL (1-2 hours)      Critically ill patients:  140 - 180 mg/dL   Lab Results  Component Value Date   GLUCAP 324* 02/29/2016   HGBA1C 12.9* 02/28/2016    Review of Glycemic Control  Results for LAYCEE, NUMBERS (MRN [id number]) as of 03/01/2016 09:53  Ref. Range 02/29/2016 11:11 02/29/2016 14:27 02/29/2016 14:55 02/29/2016 16:37 02/29/2016 20:53  Glucose-Capillary Latest Ref Range: 65-99 mg/dL 119 (H) 68 121 (H) 107 (H) 324 (H)    Diabetes history: Diabetes-See's Dr. Gabriel Carina, A1C 11.5% on 12/30/15 Outpatient Diabetes medications: Levemir 18 units q HS, Novolog 10 units tid with meals  Current orders for Inpatient glycemic control: Levemir 15 units HS, Novolog 5 units tid with meals, and Novolog moderate tid with meals and HS  Patient had a low blood sugar yesterday and subsequently refused the supper insulin- this resulted in a high blood sugar last night .    Patient went to the cath lab early this morning and did not receive insulin prior to leaving.  Will follow.  Gentry Fitz, RN, BA, MHA, CDE Diabetes Coordinator Inpatient Diabetes Program  [phone] (Team Pager) [phone] ([place]) 03/01/2016 10:08 AM

## 2016-03-02 ENCOUNTER — Telehealth: Payer: Self-pay | Admitting: Cardiovascular Disease

## 2016-03-02 LAB — CULTURE, BLOOD (ROUTINE X 2)
CULTURE: NO GROWTH
Culture: NO GROWTH

## 2016-03-02 NOTE — Telephone Encounter (Signed)
Fwd PA for Entresto to MD for review .

## 2016-03-02 NOTE — Telephone Encounter (Signed)
Patient called and needs a prior auth on sacubitril-valsartan (ENTRESTO) 24-26 MG.

## 2016-03-05 ENCOUNTER — Other Ambulatory Visit: Payer: Self-pay

## 2016-03-05 MED ORDER — SACUBITRIL-VALSARTAN 24-26 MG PO TABS: 1.0000 | ORAL_TABLET | Freq: Two times a day (BID) | ORAL | 0 refills | Status: DC

## 2016-03-06 NOTE — Telephone Encounter (Signed)
Patient is very upset that the "nurse is not doing her job" " doesn't care if she dies" and wants to speak to Dr. Rockey Situ .    Patient says she has not had entresto since she left the hospital and she was told she needs this med or she is gonna die.     Please call to speak with patient.

## 2016-03-06 NOTE — Telephone Encounter (Signed)
PA form completed by Dr. Fletcher Anon and given back to [facility].

## 2016-03-06 NOTE — Telephone Encounter (Signed)
PA has been Faxed.

## 2016-03-07 ENCOUNTER — Telehealth: Payer: Self-pay

## 2016-03-07 NOTE — Telephone Encounter (Signed)
Notified pharmacy prior authorization for Delene Loll has been approved.

## 2016-03-14 ENCOUNTER — Encounter: Payer: Self-pay | Admitting: Nurse Practitioner

## 2016-03-14 ENCOUNTER — Ambulatory Visit (INDEPENDENT_AMBULATORY_CARE_PROVIDER_SITE_OTHER): Payer: BLUE CROSS/BLUE SHIELD | Admitting: Nurse Practitioner

## 2016-03-14 VITALS — BP 100/62 | HR 78 | Ht 62.0 in | Wt 126.2 lb

## 2016-03-14 DIAGNOSIS — I10 Essential (primary) hypertension: Secondary | ICD-10-CM | POA: Diagnosis not present

## 2016-03-14 DIAGNOSIS — I429 Cardiomyopathy, unspecified: Secondary | ICD-10-CM

## 2016-03-14 DIAGNOSIS — I255 Ischemic cardiomyopathy: Secondary | ICD-10-CM | POA: Diagnosis not present

## 2016-03-14 DIAGNOSIS — I5042 Chronic combined systolic (congestive) and diastolic (congestive) heart failure: Secondary | ICD-10-CM

## 2016-03-14 DIAGNOSIS — I428 Other cardiomyopathies: Secondary | ICD-10-CM

## 2016-03-14 NOTE — Patient Instructions (Addendum)
Medication Instructions:  Your physician recommends that you continue on your current medications as directed. Please refer to the Current Medication list given to you today.   Labwork: BMET  Testing/Procedures: Your physician has requested that you have an echocardiogram in 6 weeks. Echocardiography is a painless test that uses sound waves to create images of your heart. It provides your doctor with information about the size and shape of your heart and how well your heart's chambers and valves are working. This procedure takes approximately one hour. There are no restrictions for this procedure.    Follow-Up: Your physician recommends that you schedule a follow-up appointment in: 2 months with Dr. Fletcher Anon.   Any Other Special Instructions Will Be Listed Below (If Applicable).     If you need a refill on your cardiac medications before your next appointment, please call your pharmacy.  Echocardiogram An echocardiogram, or echocardiography, uses sound waves (ultrasound) to produce an image of your heart. The echocardiogram is simple, painless, obtained within a short period of time, and offers valuable information to your health care provider. The images from an echocardiogram can provide information such as:  Evidence of coronary artery disease (CAD).  Heart size.  Heart muscle function.  Heart valve function.  Aneurysm detection.  Evidence of a past heart attack.  Fluid buildup around the heart.  Heart muscle thickening.  Assess heart valve function. LET [facility] CARE PROVIDER KNOW ABOUT:  Any allergies you have.  All medicines you are taking, including vitamins, herbs, eye drops, creams, and over-the-counter medicines.  Previous problems you or members of your family have had with the use of anesthetics.  Any blood disorders you have.  Previous surgeries you have had.  Medical conditions you have.  Possibility of pregnancy, if this applies. BEFORE THE  PROCEDURE  No special preparation is needed. Eat and drink normally.  PROCEDURE   In order to produce an image of your heart, gel will be applied to your chest and a wand-like tool (transducer) will be moved over your chest. The gel will help transmit the sound waves from the transducer. The sound waves will harmlessly bounce off your heart to allow the heart images to be captured in real-time motion. These images will then be recorded.  You may need an IV to receive a medicine that improves the quality of the pictures. AFTER THE PROCEDURE You may return to your normal schedule including diet, activities, and medicines, unless your health care provider tells you otherwise.   This information is not intended to replace advice given to you by your health care provider. Make sure you discuss any questions you have with your health care provider.   Document Released: 07/27/2000 Document Revised: 08/20/2014 Document Reviewed: 04/06/2013 Elsevier Interactive Patient Education [employer].

## 2016-03-14 NOTE — Progress Notes (Signed)
Office Visit    Patient Name: Nesreen Philipps Date of Encounter: 03/14/2016  Primary Care Provider:  No PCP Per Patient Primary Cardiologist:  M. Fletcher Anon, MD   Chief Complaint    65 year old female with a recent admission for systolic heart failure who presents for follow-up.  Past Medical History    Past Medical History:  Diagnosis Date  . 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 s/p Engineer, agricultural PPM in 03/2015  . Bradycardia   . CAD (coronary artery disease)    a. 02/2016 Cath: LM nl, LAD 30p, D1 small w/ severe ostial/mid dzs, LCX nl, RCA nl, EF <25%, glob HK.  . Chronic combined systolic and diastolic CHF (congestive heart failure) ([place])    a. Echo in 03/2015: EF 55-60%, Grade 1 DD;  b. 02/2016 Echo: 25-30%, mod LVH, Gr1 DD, mild MR, mildly dil LA, prominent apical trabeculations.  . Diabetes ([place])   . Hernia of abdominal wall   . HTN (hypertension)   . NICM (nonischemic cardiomyopathy) ([place])    a. 02/2016 relatively non-obstructive cath, EF 25-30% by echo.   Past Surgical History:  Procedure Laterality Date  . CARDIAC CATHETERIZATION Bilateral 03/01/2016   Procedure: Left Heart Cath and Coronary Angiography;  Surgeon: Minna Merritts, MD;  Location: [facility];  Service: Cardiovascular;  Laterality: Bilateral;  . HERNIA REPAIR    . PACEMAKER INSERTION    . PACEMAKER INSERTION      Allergies  Allergies  Allergen Reactions  . Bee Venom     History of Present Illness    65 year old female with a prior medical history of high-grade AV block requiring placement of a Chemical engineer dual chamber permanent pacemaker in August 2016 in [place]. She also has history of diabetes and hiatal hernia. She recently traveled to [place] in April 2017 and developed a viral illness while there. Following return to the states, she developed Bell's palsy and was subsequently placed on steroids. She recovered from Bell's palsy but in mid July, she developed worsening dyspnea  and orthopnea prompting her to present to the [facility] ED. There, troponin was minimally elevated and chest x-ray showed bilateral pleural effusions with atelectasis. She did initially require BiPAP in the setting of hypoxia and she was felt to be volume overloaded. She is admitted and treated with IV Lasix with good diuresis down to a weight of 126 pounds. Echo showed LV dysfunction, with an EF of 25-30% with moderate LVH and grade 1 diastolic dysfunction. She was seen by our team and underwent right and left heart diagnostic catheterization revealing obstructive small vessel diagonal disease and otherwise nonobstructive disease. Was felt that LV dysfunction was secondary to a nonischemic myopathy and she was placed on beta blocker, entresto, spironolactone, and by mouth Lasix.   Since her discharge from the hospital, Ms. Panos has done quite well. She is tolerating her medications well and has been compliant. She is careful with her salt intake. She is weighing herself daily and has remained 126 pounds since discharge. She has not been having chest pain and denies dyspnea, PND, orthopnea, dizziness, sick bee, edema, or early satiety.  Home Medications    Prior to Admission medications   Medication Sig Start Date End Date Taking? Authorizing Provider  carvedilol (COREG) 3.125 MG tablet Take 1 tablet (3.125 mg total) by mouth 2 (two) times daily with a meal. 03/01/16  Yes Demetrios Loll, MD  furosemide (LASIX) 40 MG tablet Take 1 tablet (40  mg total) by mouth 2 (two) times daily. 03/01/16  Yes Demetrios Loll, MD  insulin aspart (NOVOLOG) 100 UNIT/ML injection Inject 10 Units into the skin 3 (three) times daily with meals.   Yes Historical Provider, MD  LEVEMIR FLEXTOUCH 100 UNIT/ML Pen Inject 18 Units into the skin every evening.   Yes Historical Provider, MD  potassium chloride SA (K-DUR,KLOR-CON) 20 MEQ tablet Take 1 tablet (20 mEq total) by mouth daily. 03/01/16  Yes Demetrios Loll, MD  sacubitril-valsartan  (ENTRESTO) 24-26 MG Take 1 tablet by mouth 2 (two) times daily. 03/05/16  Yes Minna Merritts, MD  spironolactone (ALDACTONE) 25 MG tablet Take 1 tablet (25 mg total) by mouth daily. 03/01/16  Yes Demetrios Loll, MD    Review of Systems    Overall much improved since hospitalization.  She denies chest pain, palpitations, dyspnea, pnd, orthopnea, n, v, dizziness, syncope, edema, weight gain, or early satiety.  All other systems reviewed and are otherwise negative except as noted above.  Physical Exam    VS:  BP 100/62 (BP Location: Left Arm, Patient Position: Sitting, Cuff Size: Normal)   Pulse 78   Ht 5\' 2"  (1.575 m)   Wt 126 lb 4 oz (57.3 kg)   SpO2 98%   BMI 23.09 kg/m  , BMI Body mass index is 23.09 kg/m. GEN: Well nourished, well developed, in no acute distress.  HEENT: normal.  Neck: Supple, no JVD, carotid bruits, or masses. Cardiac: RRR, no murmurs, rubs, or gallops. No clubbing, cyanosis, edema.  Radials/DP/PT 2+ and equal bilaterally.  Respiratory:  Respirations regular and unlabored, clear to auscultation bilaterally. GI: Soft, nontender, nondistended, BS + x 4. MS: no deformity or atrophy. Skin: warm and dry, no rash. Neuro:  Strength and sensation are intact. Psych: Normal affect.  Accessory Clinical Findings    02/27/2016 Echo  Study Conclusions   - Left ventricle: The cavity size was normal. Wall thickness was   increased in a pattern of moderate LVH. There was mild concentric   hypertrophy. Systolic function was severely reduced. The   estimated ejection fraction was in the range of 25% to 30%.   Diffuse hypokinesis. Doppler parameters are consistent with   abnormal left ventricular relaxation (grade 1 diastolic   dysfunction). - Mitral valve: There was mild regurgitation. - Left atrium: The atrium was mildly dilated. - Pulmonary arteries: Systolic pressure was within the normal   range. - Pericardium, extracardiac: There was a left pleural effusion.     Impressions:   - Prominent apical trabeculations.  Assessment & Plan    1.  Chronic combined systolic and diastolic congestive heart failure/nonischemic cardiomyopathy: Patient was recently admitted with progressive dyspnea and finding of LV dysfunction with EF of 25-30% by echo. I note the catheterization revealed severe small vessel diagonal disease and otherwise nonobstructive CAD. In that setting, it was felt that her LV dysfunction is most likely secondary to nonischemic cardiopathy and potentially related to previous viral illness that resulted in Bell's palsy earlier in the spring. She has been maintained on beta blocker, Entresto, spironolactone, and by mouth Lasix. She has been tolerating these medications well and has remained asymptomatic with stable weight. I will follow-up a basic metabolic panel today and arrange for repeat 2-D echocardiogram in about 40 days. If LV dysfunction persists, we would then refer to EP for further evaluation and consideration of change out of device to a defibrillator.  2. Essential hypertension: Blood pressure is stable on current regimen.  3. Type  2 diabetes mellitus: This is followed by primary care managed with Levemir and NovoLog.  4. History of Mobitz 2 AV block status post permanent pacemaker: This was previously placed in [place] and she continues to receive EP care from there.  5. Disposition: Follow-up basic metabolic panel today. Planned follow-up echo in approximately 40 days with office follow-up afterwards.  Murray Hodgkins, NP 03/14/2016, 3:53 PM

## 2016-03-15 ENCOUNTER — Encounter: Payer: Self-pay | Admitting: Family

## 2016-03-15 ENCOUNTER — Telehealth: Payer: Self-pay | Admitting: *Deleted

## 2016-03-15 ENCOUNTER — Ambulatory Visit: Payer: BLUE CROSS/BLUE SHIELD | Attending: Family | Admitting: Family

## 2016-03-15 VITALS — BP 108/58 | HR 75 | Resp 18 | Ht 62.0 in | Wt 126.0 lb

## 2016-03-15 DIAGNOSIS — Z950 Presence of cardiac pacemaker: Secondary | ICD-10-CM | POA: Diagnosis not present

## 2016-03-15 DIAGNOSIS — Z794 Long term (current) use of insulin: Secondary | ICD-10-CM | POA: Insufficient documentation

## 2016-03-15 DIAGNOSIS — K469 Unspecified abdominal hernia without obstruction or gangrene: Secondary | ICD-10-CM | POA: Insufficient documentation

## 2016-03-15 DIAGNOSIS — Z833 Family history of diabetes mellitus: Secondary | ICD-10-CM | POA: Insufficient documentation

## 2016-03-15 DIAGNOSIS — E1159 Type 2 diabetes mellitus with other circulatory complications: Secondary | ICD-10-CM | POA: Insufficient documentation

## 2016-03-15 DIAGNOSIS — E118 Type 2 diabetes mellitus with unspecified complications: Secondary | ICD-10-CM | POA: Diagnosis not present

## 2016-03-15 DIAGNOSIS — Z91030 Bee allergy status: Secondary | ICD-10-CM | POA: Diagnosis not present

## 2016-03-15 DIAGNOSIS — I5022 Chronic systolic (congestive) heart failure: Secondary | ICD-10-CM

## 2016-03-15 DIAGNOSIS — I2510 Atherosclerotic heart disease of native coronary artery without angina pectoris: Secondary | ICD-10-CM | POA: Diagnosis not present

## 2016-03-15 DIAGNOSIS — I428 Other cardiomyopathies: Secondary | ICD-10-CM | POA: Insufficient documentation

## 2016-03-15 DIAGNOSIS — I509 Heart failure, unspecified: Secondary | ICD-10-CM | POA: Insufficient documentation

## 2016-03-15 DIAGNOSIS — Z79899 Other long term (current) drug therapy: Secondary | ICD-10-CM | POA: Insufficient documentation

## 2016-03-15 DIAGNOSIS — E109 Type 1 diabetes mellitus without complications: Secondary | ICD-10-CM

## 2016-03-15 DIAGNOSIS — I110 Hypertensive heart disease with heart failure: Secondary | ICD-10-CM | POA: Insufficient documentation

## 2016-03-15 DIAGNOSIS — I5043 Acute on chronic combined systolic (congestive) and diastolic (congestive) heart failure: Secondary | ICD-10-CM

## 2016-03-15 DIAGNOSIS — E119 Type 2 diabetes mellitus without complications: Secondary | ICD-10-CM | POA: Insufficient documentation

## 2016-03-15 DIAGNOSIS — I10 Essential (primary) hypertension: Secondary | ICD-10-CM

## 2016-03-15 LAB — BASIC METABOLIC PANEL
BUN / CREAT RATIO: 27 (ref 12–28)
BUN: 23 mg/dL (ref 8–27)
CO2: 22 mmol/L (ref 18–29)
CREATININE: 0.84 mg/dL (ref 0.57–1.00)
Calcium: 9.1 mg/dL (ref 8.7–10.3)
Chloride: 97 mmol/L (ref 96–106)
GFR calc Af Amer: 86 mL/min/{1.73_m2} (ref >59)
GFR, EST NON AFRICAN AMERICAN: 74 mL/min/{1.73_m2} (ref >59)
GLUCOSE: 343 mg/dL — AB (ref 65–99)
POTASSIUM: 4.7 mmol/L (ref 3.5–5.2)
SODIUM: 137 mmol/L (ref 134–144)

## 2016-03-15 MED ORDER — POTASSIUM CHLORIDE CRYS ER 20 MEQ PO TBCR: 20.0000 meq | EXTENDED_RELEASE_TABLET | Freq: Every day | ORAL | 5 refills | Status: DC

## 2016-03-15 NOTE — Progress Notes (Signed)
Subjective:    Patient ID: Bethany Gillespie, female    DOB: 02/06/1951, 65 y.o.   MRN: [id number]  Congestive Heart Failure  Presents for initial visit. The disease course has been stable. Associated symptoms include fatigue. Pertinent negatives include no abdominal pain, chest pain, chest pressure, edema, muscle weakness, orthopnea, palpitations or shortness of breath. The symptoms have been stable. Past treatments include angiotensin receptor blockers, beta blockers and salt and fluid restriction. The treatment provided significant relief. Compliance with prior treatments has been good. Her past medical history is significant for CAD, DM and HTN. She has one 1st degree relative with heart disease.  Hypertension  This is a chronic problem. The current episode started more than 1 year ago. The problem has been resolved since onset. The problem is controlled. Pertinent negatives include no chest pain, neck pain, palpitations, peripheral edema or shortness of breath. There are no associated agents to hypertension. Risk factors for coronary artery disease include diabetes mellitus, post-menopausal state and family history. Past treatments include beta blockers, angiotensin blockers, diuretics and lifestyle changes. The current treatment provides significant improvement. There are no compliance problems.  Hypertensive end-organ damage includes CAD/MI and heart failure.   Past Medical History:  Diagnosis Date  . 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 s/p Engineer, agricultural PPM in 03/2015  . Bradycardia   . CAD (coronary artery disease)    a. 02/2016 Cath: LM nl, LAD 30p, D1 small w/ severe ostial/mid dzs, LCX nl, RCA nl, EF <25%, glob HK.  . Chronic combined systolic and diastolic CHF (congestive heart failure) ([place])    a. Echo in 03/2015: EF 55-60%, Grade 1 DD;  b. 02/2016 Echo: 25-30%, mod LVH, Gr1 DD, mild MR, mildly dil LA, prominent apical trabeculations.  . Diabetes ([place])   . Hernia of abdominal  wall   . HTN (hypertension)   . NICM (nonischemic cardiomyopathy) ([place])    a. 02/2016 relatively non-obstructive cath, EF 25-30% by echo.    Past Surgical History:  Procedure Laterality Date  . CARDIAC CATHETERIZATION Bilateral 03/01/2016   Procedure: Left Heart Cath and Coronary Angiography;  Surgeon: Minna Merritts, MD;  Location: [facility];  Service: Cardiovascular;  Laterality: Bilateral;  . HERNIA REPAIR    . PACEMAKER INSERTION    . PACEMAKER INSERTION      Family History  Problem Relation Age of Onset  . Diabetes Mellitus I Grandchild     Social History  Substance Use Topics  . Smoking status: Never Smoker  . Smokeless tobacco: Never Used  . Alcohol use No    Allergies  Allergen Reactions  . Bee Venom     Prior to Admission medications   Medication Sig Start Date End Date Taking? Authorizing Provider  carvedilol (COREG) 3.125 MG tablet Take 1 tablet (3.125 mg total) by mouth 2 (two) times daily with a meal. 03/01/16  Yes Demetrios Loll, MD  furosemide (LASIX) 40 MG tablet Take 1 tablet (40 mg total) by mouth 2 (two) times daily. 03/01/16  Yes Demetrios Loll, MD  insulin aspart (NOVOLOG) 100 UNIT/ML injection Inject 10 Units into the skin 3 (three) times daily with meals.   Yes Historical Provider, MD  LEVEMIR FLEXTOUCH 100 UNIT/ML Pen Inject 18 Units into the skin every evening.   Yes Historical Provider, MD  Melatonin 3 MG CAPS Take 1 capsule by mouth at bedtime as needed.   Yes Historical Provider, MD  potassium chloride SA (K-DUR,KLOR-CON) 20 MEQ tablet  Take 1 tablet (20 mEq total) by mouth daily. 03/15/16  Yes Alisa Graff, FNP  sacubitril-valsartan (ENTRESTO) 24-26 MG Take 1 tablet by mouth 2 (two) times daily. 03/05/16  Yes Minna Merritts, MD  spironolactone (ALDACTONE) 25 MG tablet Take 1 tablet (25 mg total) by mouth daily. 03/01/16  Yes Demetrios Loll, MD      Review of Systems  Constitutional: Positive for fatigue. Negative for appetite change.  HENT:  Positive for congestion. Negative for postnasal drip and sore throat.   Eyes: Negative.   Respiratory: Negative for cough, chest tightness and shortness of breath.   Cardiovascular: Negative for chest pain, palpitations and leg swelling.  Gastrointestinal: Negative for abdominal distention and abdominal pain.  Endocrine: Negative.   Genitourinary: Negative.   Musculoskeletal: Negative for back pain, muscle weakness and neck pain.  Skin: Negative.   Allergic/Immunologic: Negative.   Neurological: Negative for dizziness and light-headedness.  Hematological: Negative for adenopathy. Does not bruise/bleed easily.  Psychiatric/Behavioral: Positive for sleep disturbance (sleeping on 1 pillow; waking up often). Negative for dysphoric mood. The patient is not nervous/anxious.        Objective:   Physical Exam  Constitutional: She is oriented to person, place, and time. She appears well-developed and well-nourished.  HENT:  Head: Normocephalic and atraumatic.  Eyes: Conjunctivae are normal. Pupils are equal, round, and reactive to light.  Neck: Normal range of motion. Neck supple.  Cardiovascular: Normal rate and regular rhythm.   Pulmonary/Chest: Effort normal. She has no wheezes. She has no rales.  Abdominal: Soft. She exhibits no distension. There is no tenderness.  Musculoskeletal: She exhibits no edema or tenderness.  Neurological: She is alert and oriented to person, place, and time.  Skin: Skin is warm and dry.  Psychiatric: She has a normal mood and affect. Her behavior is normal. Thought content normal.  Nursing note and vitals reviewed.   BP (!) 108/58 (BP Location: Left Arm, Patient Position: Sitting)   Pulse 75   Resp 18   Ht 5\' 2"  (1.575 m)   Wt 126 lb (57.2 kg)   SpO2 99%   BMI 23.05 kg/m        Assessment & Plan:  1: Chronic heart failure with reduced ejection fraction- Patient presents with minimal fatigue upon exertion (Class II). She denies any shortness of  breath or swelling in her legs/abdomen. She is already weighing herself daily and says that her weight has been stable. Discussed the importance of calling for an overnight weight gain of >2 pounds or a weekly weight gain of >5 pounds. She is not adding any salt to her food and is reading food labels. Discussed the importance of following a 2000mg  sodium diet and written information was given to her about this. She is already on low dose entresto and carvedilol. 2: HTN- Blood pressure looks great. It is on the low side so may not be able to titrate up medications. 3: Diabetes- She says that her glucose this morning was 169. She says that her previous PCP moved out of state and she is planning on calling [facility] to get established with someone there. Does follow with endocrinologist.  Medication list was reviewed with the patient.   Return here in 1 month or sooner for any questions/problems before then.

## 2016-03-15 NOTE — Telephone Encounter (Signed)
Reviewed results in detail with patient and let her know that I would forward her results to PCP and that we would like her to come in one week to have her labs rechecked. She verbalized understanding of our conversation, agreement with plan of care, and had no further questions at this time. Lab order entered and patient scheduled to return for labs.

## 2016-03-15 NOTE — Telephone Encounter (Signed)
-----   Message from Rogelia Mire, NP sent at 03/15/2016  8:04 AM EDT ----- Renal fxn and lytes are wnl.  Glucose is very high and she will need to f/u with PCP re: diabetes mgmt.  Though normal, her potassium is on the high normal side.  Since she is new to spironolactone, she will need another bmet in a week.

## 2016-03-15 NOTE — Patient Instructions (Signed)
Continue weighing daily and call for an overnight weight gain of > 2 pounds or a weekly weight gain of >5 pounds. 

## 2016-03-20 ENCOUNTER — Encounter: Payer: Self-pay | Admitting: *Deleted

## 2016-03-20 ENCOUNTER — Encounter: Payer: BLUE CROSS/BLUE SHIELD | Attending: Internal Medicine | Admitting: *Deleted

## 2016-03-20 VITALS — BP 88/60 | Ht 62.0 in | Wt 129.4 lb

## 2016-03-20 DIAGNOSIS — E119 Type 2 diabetes mellitus without complications: Secondary | ICD-10-CM | POA: Insufficient documentation

## 2016-03-20 DIAGNOSIS — Z713 Dietary counseling and surveillance: Secondary | ICD-10-CM | POA: Diagnosis not present

## 2016-03-20 DIAGNOSIS — Z6823 Body mass index (BMI) 23.0-23.9, adult: Secondary | ICD-10-CM | POA: Insufficient documentation

## 2016-03-20 DIAGNOSIS — Z794 Long term (current) use of insulin: Secondary | ICD-10-CM

## 2016-03-20 DIAGNOSIS — E1165 Type 2 diabetes mellitus with hyperglycemia: Secondary | ICD-10-CM

## 2016-03-20 NOTE — Progress Notes (Signed)
Diabetes Self-Management Education  Visit Type: First/Initial  Appt. Start Time: 0900 Appt. End Time: 1010  03/20/2016  [name], identified by name and date of birth, is a 65 y.o. female with a diagnosis of Diabetes: Type 2.   ASSESSMENT  Blood pressure (!) 88/60, height 5\' 2"  (1.575 m), weight 129 lb 6.4 oz (58.7 kg). Body mass index is 23.67 kg/m.      Diabetes Self-Management Education - 03/20/16 1016      Visit Information   Visit Type First/Initial     Initial Visit   Diabetes Type Type 2   Are you currently following a meal plan? No   Are you taking your medications as prescribed? Yes   Date Diagnosed 5 years ago     Psychosocial Assessment   Patient Belief/Attitude about Diabetes Motivated to manage diabetes   Self-care barriers None   Self-management support Doctor's office;Family   Patient Concerns Nutrition/Meal planning   Special Needs None   Preferred Learning Style Visual   Learning Readiness Ready   How often do you need to have someone help you when you read instructions, pamphlets, or other written materials from your doctor or pharmacy? 1 - Never   What is the last grade level you completed in school? 3 years college     Pre-Education Assessment   Patient understands the diabetes disease and treatment process. Needs Review   Patient understands incorporating nutritional management into lifestyle. Needs Instruction   Patient undertands incorporating physical activity into lifestyle. Needs Review   Patient understands using medications safely. Needs Instruction   Patient understands monitoring blood glucose, interpreting and using results Needs Review   Patient understands prevention, detection, and treatment of acute complications. Needs Instruction   Patient understands prevention, detection, and treatment of chronic complications. Needs Review   Patient understands how to develop strategies to address psychosocial issues. Needs Instruction    Patient understands how to develop strategies to promote health/change behavior. Needs Instruction     Complications   Last HgB A1C per patient/outside source 12.9 %  02/28/16   How often do you check your blood sugar? 3-4 times/day   Fasting Blood glucose range (mg/dL) >200  Pt reports 1 reading in 100's mg/dL recently but mostly 200-300's mg/dL.    Postprandial Blood glucose range (mg/dL) --  Pt reports readings before lunch 100's-200's mg/dL, before supper 300's mg/dL and bedtime 100's-250's mg/dL.    Number of hypoglycemic episodes per month 3   Can you tell when your blood sugar is low? Yes  vision gets blurry   What do you do if your blood sugar is low? drinks juice or eats something   Have you had a dilated eye exam in the past 12 months? No   Have you had a dental exam in the past 12 months? No   Are you checking your feet? Yes   How many days per week are you checking your feet? 7     Dietary Intake   Breakfast 1/2 cup oatmeal with banana or cantaloupe   Lunch salads with tuna, steak or chicken   Snack (afternoon) sugar free jello   Dinner meat and vegetables; sweet potato, squash   Snack (evening) cereal   Beverage(s) water, diet soda, unsweetened tea     Exercise   Exercise Type Light (walking / raking leaves)   How many days per week to you exercise? 7   How many minutes per day do you exercise? 60   Total  minutes per week of exercise 420     Patient Education   Previous Diabetes Education No   Disease state  Definition of diabetes, type 1 and 2, and the diagnosis of diabetes   Nutrition management  Role of diet in the treatment of diabetes and the relationship between the three main macronutrients and blood glucose level   Physical activity and exercise  Role of exercise on diabetes management, blood pressure control and cardiac health.   Medications Taught/reviewed insulin injection, site rotation, insulin storage and needle disposal.;Reviewed patients medication  for diabetes, action, purpose, timing of dose and side effects.   Monitoring Purpose and frequency of SMBG.;Identified appropriate SMBG and/or A1C goals.   Acute complications Taught treatment of hypoglycemia - the 15 rule.   Chronic complications Relationship between chronic complications and blood glucose control   Psychosocial adjustment Identified and addressed patients feelings and concerns about diabetes     Individualized Goals (developed by patient)   Reducing Risk Improve blood sugars Prevent diabetes complications     Outcomes   Expected Outcomes Demonstrated interest in learning. Expect positive outcomes   Future DMSE 2 wks      Individualized Plan for Diabetes Self-Management Training:   Learning Objective:  Patient will have a greater understanding of diabetes self-management. Patient education plan is to attend individual and/or group sessions per assessed needs and concerns.   Plan:   Patient Instructions  Check blood sugars 4 x day before each meal and before bed every day Exercise: Continue walking  for   60  minutes   7  days a week Eat 3 meals day,  2-3  snacks a day Space meals 4-6 hours apart Eat a bedtime snack with 1 protein and 1 carbohydrate serving Make an eye doctor appointment Bring blood sugar records to the next class Carry fast acting glucose and a snack at all times Rotate injection sites   Expected Outcomes:  Demonstrated interest in learning. Expect positive outcomes  Education material provided:  General Meal Planning Guidelines Simple Meal Plan Glucose tablets Symptoms, causes and treatments of Hypoglycemia   If problems or questions, patient to contact team via:  Bethany Gillespie, [place], [place], CDE [phone]  Future DSME appointment: 2 wks  Thursday April 05, 2016 for Diabetes Class 1

## 2016-03-20 NOTE — Patient Instructions (Signed)
Check blood sugars 4 x day before each meal and before bed every day  Exercise: Continue walking  for   60  minutes   7  days a week  Eat 3 meals day,  2-3  snacks a day Space meals 4-6 hours apart Eat a bedtime snack with 1 protein and 1 carbohydrate serving  Make an eye doctor appointment  Bring blood sugar records to the next class  Carry fast acting glucose and a snack at all times  Rotate injection sites  Return for classes on:

## 2016-03-22 ENCOUNTER — Telehealth: Payer: Self-pay | Admitting: Cardiovascular Disease

## 2016-03-22 ENCOUNTER — Other Ambulatory Visit (INDEPENDENT_AMBULATORY_CARE_PROVIDER_SITE_OTHER): Payer: BLUE CROSS/BLUE SHIELD | Admitting: *Deleted

## 2016-03-22 DIAGNOSIS — I5043 Acute on chronic combined systolic (congestive) and diastolic (congestive) heart failure: Secondary | ICD-10-CM | POA: Diagnosis not present

## 2016-03-22 NOTE — Telephone Encounter (Signed)
Pt in the office today for labs and mentioned to CMA that BP has been low.  I met with patient in the lobby and she reports BP 88/60 on 8/8 while meeting with Diabetes Educator. She takes entresto, coreg and lasix BID and spironolactone qd. She has been asymptomatic and states she feels great. Pt has been following low sodium diet and has been compliant with medications. Documented BP Aug 3 at CHF clinic was 108/58 and 100/62 on 8/2, HR 70s.  Advised pt to monitor and record BP x 5 days and reports readings to [place]. She understands to contact our office if SBP<100 as she would need to hold meds until BP improves. Pt does not have BP cuff at home but is agreeable to purchase one today to begin monitoring. She verbalized understanding of all instructions w/no further questions.

## 2016-03-23 LAB — BASIC METABOLIC PANEL
BUN/Creatinine Ratio: 27 (ref 12–28)
BUN: 25 mg/dL (ref 8–27)
CALCIUM: 9.3 mg/dL (ref 8.7–10.3)
CHLORIDE: 100 mmol/L (ref 96–106)
CO2: 27 mmol/L (ref 18–29)
Creatinine, Ser: 0.92 mg/dL (ref 0.57–1.00)
GFR calc Af Amer: 77 mL/min/{1.73_m2} (ref >59)
GFR calc non Af Amer: 66 mL/min/{1.73_m2} (ref >59)
GLUCOSE: 319 mg/dL — AB (ref 65–99)
POTASSIUM: 5 mmol/L (ref 3.5–5.2)
SODIUM: 141 mmol/L (ref 134–144)

## 2016-03-26 ENCOUNTER — Other Ambulatory Visit: Payer: Self-pay

## 2016-03-26 DIAGNOSIS — E875 Hyperkalemia: Secondary | ICD-10-CM

## 2016-04-02 ENCOUNTER — Other Ambulatory Visit: Payer: Self-pay

## 2016-04-02 ENCOUNTER — Other Ambulatory Visit (INDEPENDENT_AMBULATORY_CARE_PROVIDER_SITE_OTHER): Payer: BLUE CROSS/BLUE SHIELD

## 2016-04-02 DIAGNOSIS — E875 Hyperkalemia: Secondary | ICD-10-CM | POA: Diagnosis not present

## 2016-04-02 MED ORDER — CARVEDILOL 3.125 MG PO TABS: 3.1250 mg | ORAL_TABLET | Freq: Two times a day (BID) | ORAL | 3 refills | Status: DC

## 2016-04-02 MED ORDER — FUROSEMIDE 40 MG PO TABS: 40.0000 mg | ORAL_TABLET | Freq: Two times a day (BID) | ORAL | 3 refills | Status: DC

## 2016-04-02 MED ORDER — SPIRONOLACTONE 25 MG PO TABS: 25.0000 mg | ORAL_TABLET | Freq: Every day | ORAL | 3 refills | Status: DC

## 2016-04-03 ENCOUNTER — Other Ambulatory Visit: Payer: Self-pay

## 2016-04-03 DIAGNOSIS — I10 Essential (primary) hypertension: Secondary | ICD-10-CM

## 2016-04-03 LAB — BASIC METABOLIC PANEL
BUN/Creatinine Ratio: 23 (ref 12–28)
BUN: 22 mg/dL (ref 8–27)
CALCIUM: 9.2 mg/dL (ref 8.7–10.3)
CHLORIDE: 103 mmol/L (ref 96–106)
CO2: 20 mmol/L (ref 18–29)
Creatinine, Ser: 0.95 mg/dL (ref 0.57–1.00)
GFR calc Af Amer: 74 mL/min/{1.73_m2} (ref >59)
GFR, EST NON AFRICAN AMERICAN: 64 mL/min/{1.73_m2} (ref >59)
Glucose: 109 mg/dL — ABNORMAL HIGH (ref 65–99)
POTASSIUM: 3.7 mmol/L (ref 3.5–5.2)
Sodium: 142 mmol/L (ref 134–144)

## 2016-04-05 ENCOUNTER — Encounter: Payer: BLUE CROSS/BLUE SHIELD | Admitting: Dietician

## 2016-04-05 ENCOUNTER — Encounter: Payer: Self-pay | Admitting: Dietician

## 2016-04-05 VITALS — Ht 62.0 in | Wt 125.6 lb

## 2016-04-05 DIAGNOSIS — E1165 Type 2 diabetes mellitus with hyperglycemia: Secondary | ICD-10-CM

## 2016-04-05 DIAGNOSIS — Z794 Long term (current) use of insulin: Principal | ICD-10-CM

## 2016-04-05 DIAGNOSIS — Z713 Dietary counseling and surveillance: Secondary | ICD-10-CM | POA: Diagnosis not present

## 2016-04-05 NOTE — Progress Notes (Signed)

## 2016-04-12 ENCOUNTER — Encounter: Payer: BLUE CROSS/BLUE SHIELD | Admitting: *Deleted

## 2016-04-12 ENCOUNTER — Encounter: Payer: Self-pay | Admitting: *Deleted

## 2016-04-12 VITALS — Wt 127.8 lb

## 2016-04-12 DIAGNOSIS — Z794 Long term (current) use of insulin: Principal | ICD-10-CM

## 2016-04-12 DIAGNOSIS — Z713 Dietary counseling and surveillance: Secondary | ICD-10-CM | POA: Diagnosis not present

## 2016-04-12 DIAGNOSIS — E1165 Type 2 diabetes mellitus with hyperglycemia: Secondary | ICD-10-CM

## 2016-04-12 NOTE — Progress Notes (Signed)
Appt. Start Time: 0900 Appt. End Time: 1130  Class 2 Diabetes Overview - define DM; state own type of DM; identify functions of pancreas and insulin; define insulin deficiency vs insulin resistance  Psychosocial - identify DM as a source of stress; state the effects of stress on BG control; verbalize appropriate stress management techniques; identify personal stress issues   Nutritional Management - describe effects of food on blood glucose; identify sources of carbohydrate, protein and fat; verbalize the importance of balance meals in controlling blood glucose; identify meals as well balanced or not; estimate servings of carbohydrate from menus; use food labels to identify servings size, content of carbohydrate, fiber, protein, fat, saturated fat and sodium; recognize food sources of fat, saturated fat, trans fat, sodium and verbalize goals for intake; describe healthful appropriate food choices when dining out   Exercise - describe the effects of exercise on blood glucose and importance of regular exercise in controlling diabetes; state a plan for personal exercise; verbalize contraindications for exercise  Medications - state name, dose, timing of currently prescribed medications; describe types of medications available for diabetes  Self-Monitoring - state importance of HBGM and demo procedure accurately; use HBGM results to effectively manage diabetes; identify importance of regular HbA1C testing and goals for results  Acute Complications/Sick Day Guidelines - recognize hyperglycemia and hypoglycemia with causes and effects; identify blood glucose results as high, low or in control; list steps in treating and preventing high and low blood glucose; state appropriate measure to manage blood glucose when ill (need for meds, HBGM plan, when to call physician, need for fluids)  Chronic Complications/Foot, Skin, Eye Dental Care - identify possible long-term complications of diabetes (retinopathy,  neuropathy, nephropathy, cardiovascular disease, infections); explain steps in prevention and treatment of chronic complications; state importance of daily self-foot exams; describe how to examine feet and what to look for; explain appropriate eye and dental care  Lifestyle Changes/Goals & Health/Community Resources - state benefits of making appropriate lifestyle changes; identify habits that need to change (meals, tobacco, alcohol); identify strategies to reduce risk factors for personal health; set goals for proper diabetes care; state need for and frequency of healthcare follow-up; describe appropriate community resources for good health (ADA, web sites, apps)   Pregnancy/Sexual Health - define gestational diabetes; state importance of good blood glucose control and birth control prior to pregnancy; state importance of good blood glucose control in preventing sexual problems (impotence, vaginal dryness, infections, loss of desire); state relationship of blood glucose control and pregnancy outcome; describe risk of maternal and fetal complications  Teaching Materials Used: Class 2 Slide Packet A1C Pamphlet Foot Care Literature Menu Ideas Goals for Class 2

## 2016-04-18 ENCOUNTER — Encounter: Payer: Self-pay | Admitting: Family

## 2016-04-18 ENCOUNTER — Ambulatory Visit: Payer: BLUE CROSS/BLUE SHIELD | Attending: Family | Admitting: Family

## 2016-04-18 VITALS — BP 114/65 | HR 80 | Resp 18 | Ht 62.0 in | Wt 129.0 lb

## 2016-04-18 DIAGNOSIS — R001 Bradycardia, unspecified: Secondary | ICD-10-CM | POA: Diagnosis not present

## 2016-04-18 DIAGNOSIS — E109 Type 1 diabetes mellitus without complications: Secondary | ICD-10-CM | POA: Insufficient documentation

## 2016-04-18 DIAGNOSIS — I420 Dilated cardiomyopathy: Secondary | ICD-10-CM | POA: Insufficient documentation

## 2016-04-18 DIAGNOSIS — Z823 Family history of stroke: Secondary | ICD-10-CM | POA: Insufficient documentation

## 2016-04-18 DIAGNOSIS — Z91030 Bee allergy status: Secondary | ICD-10-CM | POA: Insufficient documentation

## 2016-04-18 DIAGNOSIS — Z794 Long term (current) use of insulin: Secondary | ICD-10-CM | POA: Diagnosis not present

## 2016-04-18 DIAGNOSIS — I2510 Atherosclerotic heart disease of native coronary artery without angina pectoris: Secondary | ICD-10-CM | POA: Insufficient documentation

## 2016-04-18 DIAGNOSIS — I110 Hypertensive heart disease with heart failure: Secondary | ICD-10-CM | POA: Diagnosis not present

## 2016-04-18 DIAGNOSIS — Z833 Family history of diabetes mellitus: Secondary | ICD-10-CM | POA: Insufficient documentation

## 2016-04-18 DIAGNOSIS — K439 Ventral hernia without obstruction or gangrene: Secondary | ICD-10-CM | POA: Diagnosis not present

## 2016-04-18 DIAGNOSIS — I5022 Chronic systolic (congestive) heart failure: Secondary | ICD-10-CM | POA: Insufficient documentation

## 2016-04-18 DIAGNOSIS — I10 Essential (primary) hypertension: Secondary | ICD-10-CM

## 2016-04-18 DIAGNOSIS — G510 Bell's palsy: Secondary | ICD-10-CM | POA: Insufficient documentation

## 2016-04-18 DIAGNOSIS — I441 Atrioventricular block, second degree: Secondary | ICD-10-CM | POA: Diagnosis not present

## 2016-04-18 DIAGNOSIS — Z950 Presence of cardiac pacemaker: Secondary | ICD-10-CM | POA: Diagnosis not present

## 2016-04-18 MED ORDER — TORSEMIDE 20 MG PO TABS: 40.0000 mg | ORAL_TABLET | Freq: Two times a day (BID) | ORAL | 3 refills | Status: DC

## 2016-04-18 NOTE — Patient Instructions (Addendum)
Continue weighing daily and call for an overnight weight gain of > 2 pounds or a weekly weight gain of >5 pounds.  Stop furosemide and begin torsemide 40mg  (2 tablets) twice daily to help with abdominal swelling.

## 2016-04-18 NOTE — Progress Notes (Signed)
Subjective:    Patient ID: Bethany Gillespie, female    DOB: 1950-12-26, 65 y.o.   MRN: [id number]  Congestive Heart Failure  Presents for follow-up visit. The disease course has been stable. Associated symptoms include edema and fatigue. Pertinent negatives include no abdominal pain, chest pain, orthopnea, palpitations or shortness of breath. (Abdominal distention) The symptoms have been worsening. Past treatments include beta blockers, angiotensin receptor blockers, aldosterone receptor blockers and salt and fluid restriction. The treatment provided mild relief. Compliance with prior treatments has been good. Her past medical history is significant for CAD, DM and HTN. She has one 1st degree relative with heart disease.  Hypertension  This is a chronic problem. The current episode started more than 1 year ago. The problem is unchanged. The problem is controlled. Pertinent negatives include no chest pain, neck pain, palpitations, peripheral edema or shortness of breath. There are no associated agents to hypertension. Risk factors for coronary artery disease include family history, post-menopausal state and diabetes mellitus. Past treatments include beta blockers, diuretics, lifestyle changes and angiotensin blockers. The current treatment provides mild improvement. There are no compliance problems.  Hypertensive end-organ damage includes heart failure.    Past Medical History:  Diagnosis Date  . 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 s/p Engineer, agricultural PPM in 03/2015  . Bell's palsy   . Bradycardia   . CAD (coronary artery disease)    a. 02/2016 Cath: LM nl, LAD 30p, D1 small w/ severe ostial/mid dzs, LCX nl, RCA nl, EF <25%, glob HK.  . Chronic combined systolic and diastolic CHF (congestive heart failure) ([place])    a. Echo in 03/2015: EF 55-60%, Grade 1 DD;  b. 02/2016 Echo: 25-30%, mod LVH, Gr1 DD, mild MR, mildly dil LA, prominent apical trabeculations.  . Diabetes ([place])   . Hernia of  abdominal wall   . HTN (hypertension)   . Hypertension   . NICM (nonischemic cardiomyopathy) ([place])    a. 02/2016 relatively non-obstructive cath, EF 25-30% by echo.    Past Surgical History:  Procedure Laterality Date  . CARDIAC CATHETERIZATION Bilateral 03/01/2016   Procedure: Left Heart Cath and Coronary Angiography;  Surgeon: Minna Merritts, MD;  Location: [facility];  Service: Cardiovascular;  Laterality: Bilateral;  . HERNIA REPAIR    . PACEMAKER INSERTION    . PACEMAKER INSERTION      Family History  Problem Relation Age of Onset  . Diabetes Mellitus I Grandchild   . Stroke Father   . Diabetes Brother     Social History  Substance Use Topics  . Smoking status: Never Smoker  . Smokeless tobacco: Never Used  . Alcohol use No    Allergies  Allergen Reactions  . Bee Venom Anaphylaxis    Prior to Admission medications   Medication Sig Start Date End Date Taking? Authorizing Provider  carvedilol (COREG) 3.125 MG tablet Take 1 tablet (3.125 mg total) by mouth 2 (two) times daily with a meal. 04/02/16  Yes Minna Merritts, MD  insulin aspart (NOVOLOG) 100 UNIT/ML injection Inject 5 Units into the skin 3 (three) times daily with meals.    Yes Historical Provider, MD  LEVEMIR FLEXTOUCH 100 UNIT/ML Pen Inject 18 Units into the skin every evening.   Yes Historical Provider, MD  Melatonin 3 MG CAPS Take 1 capsule by mouth at bedtime as needed.   Yes Historical Provider, MD  potassium chloride SA (K-DUR,KLOR-CON) 20 MEQ tablet Take 1 tablet (20 mEq  total) by mouth daily. 03/15/16  Yes Alisa Graff, FNP  sacubitril-valsartan (ENTRESTO) 24-26 MG Take 1 tablet by mouth 2 (two) times daily. 03/05/16  Yes Minna Merritts, MD  spironolactone (ALDACTONE) 25 MG tablet Take 1 tablet (25 mg total) by mouth daily. 04/02/16  Yes Minna Merritts, MD  torsemide (DEMADEX) 20 MG tablet Take 2 tablets (40 mg total) by mouth 2 (two) times daily. 04/18/16 05/18/16  Alisa Graff, FNP      Review of Systems  Constitutional: Positive for fatigue. Negative for appetite change.  HENT: Positive for rhinorrhea. Negative for congestion and sore throat.   Eyes: Negative.   Respiratory: Negative for cough, chest tightness and shortness of breath.   Cardiovascular: Negative for chest pain, palpitations and leg swelling.  Gastrointestinal: Positive for abdominal distention. Negative for abdominal pain.  Endocrine: Negative.   Genitourinary: Negative.   Musculoskeletal: Negative for back pain and neck pain.  Skin: Negative.   Allergic/Immunologic: Negative.   Neurological: Negative for dizziness and light-headedness.  Hematological: Negative for adenopathy. Does not bruise/bleed easily.  Psychiatric/Behavioral: Negative for dysphoric mood and sleep disturbance (sleeping on 1 pillow). The patient is not nervous/anxious.        Objective:   Physical Exam  Constitutional: She is oriented to person, place, and time. She appears well-developed and well-nourished.  HENT:  Head: Normocephalic and atraumatic.  Eyes: Conjunctivae are normal. Pupils are equal, round, and reactive to light.  Neck: Normal range of motion. Neck supple.  Cardiovascular: Normal rate and regular rhythm.   Pulmonary/Chest: Effort normal. She has no wheezes. She has no rales.  Abdominal: Soft. She exhibits distension. There is no tenderness.  Musculoskeletal: She exhibits no edema or tenderness.  Neurological: She is alert and oriented to person, place, and time.  Skin: Skin is warm and dry.  Psychiatric: She has a normal mood and affect. Her behavior is normal. Thought content normal.  Nursing note and vitals reviewed.  BP 114/65   Pulse 80   Resp 18   Ht 5\' 2"  (1.575 m)   Wt 129 lb (58.5 kg)   SpO2 100%   BMI 23.59 kg/m         Assessment & Plan:  1: Chronic heart failure with reduced ejection fraction- Patient presents with fatigue with moderate exertion (Class II) that improves quickly  upon rest. She denies any shortness of breath or pedal edema. She does notice an increase in the amount of abdominal swelling that she has. She continues to weigh herself and she does notice a gradual weight gain. By our scale, she has gained 3.6 pounds since she was last here on 03/15/16. Reminded to call for an overnight weight gain of >2 pounds or a weekly weight gain of >5 pounds. She is not adding any salt to her food and tries to eat low sodium foods. Based on increasing abdominal distention, will change her diuretic to torsemide 40mg  twice daily. She was instructed to stop the furosemide when she picks up the torsemide prescription. She already has an appointment with her cardiologist Rockey Situ) on 05/08/16. May need a basic metabolic panel checked with changing diuretic. 2: HTN- Blood pressure looks good today. Continue medications at this time. 3: Diabetes- She says that her glucose level ranges from 69-226. She hasn't gotten established with a PCP and she says that she is going to call and get established with one.  Return here in 3 months or sooner for any questions/problems before then.

## 2016-04-19 ENCOUNTER — Encounter: Payer: BLUE CROSS/BLUE SHIELD | Attending: Internal Medicine | Admitting: Dietician

## 2016-04-19 ENCOUNTER — Encounter: Payer: Self-pay | Admitting: Dietician

## 2016-04-19 VITALS — BP 104/56 | Ht 62.0 in | Wt 127.6 lb

## 2016-04-19 DIAGNOSIS — E1165 Type 2 diabetes mellitus with hyperglycemia: Secondary | ICD-10-CM

## 2016-04-19 DIAGNOSIS — Z713 Dietary counseling and surveillance: Secondary | ICD-10-CM | POA: Insufficient documentation

## 2016-04-19 DIAGNOSIS — E119 Type 2 diabetes mellitus without complications: Secondary | ICD-10-CM | POA: Insufficient documentation

## 2016-04-19 DIAGNOSIS — Z6823 Body mass index (BMI) 23.0-23.9, adult: Secondary | ICD-10-CM | POA: Insufficient documentation

## 2016-04-19 DIAGNOSIS — Z794 Long term (current) use of insulin: Secondary | ICD-10-CM

## 2016-04-19 NOTE — Progress Notes (Signed)

## 2016-04-20 ENCOUNTER — Encounter: Payer: Self-pay | Admitting: *Deleted

## 2016-04-25 ENCOUNTER — Other Ambulatory Visit: Payer: BLUE CROSS/BLUE SHIELD

## 2016-05-02 ENCOUNTER — Other Ambulatory Visit: Payer: Self-pay

## 2016-05-02 ENCOUNTER — Other Ambulatory Visit: Payer: Self-pay | Admitting: Nurse Practitioner

## 2016-05-02 ENCOUNTER — Ambulatory Visit (INDEPENDENT_AMBULATORY_CARE_PROVIDER_SITE_OTHER): Payer: BLUE CROSS/BLUE SHIELD

## 2016-05-02 DIAGNOSIS — I428 Other cardiomyopathies: Secondary | ICD-10-CM

## 2016-05-02 DIAGNOSIS — I429 Cardiomyopathy, unspecified: Secondary | ICD-10-CM

## 2016-05-07 ENCOUNTER — Other Ambulatory Visit (INDEPENDENT_AMBULATORY_CARE_PROVIDER_SITE_OTHER): Payer: BLUE CROSS/BLUE SHIELD

## 2016-05-07 DIAGNOSIS — I10 Essential (primary) hypertension: Secondary | ICD-10-CM

## 2016-05-08 ENCOUNTER — Ambulatory Visit: Payer: BLUE CROSS/BLUE SHIELD | Admitting: Cardiovascular Disease

## 2016-05-08 ENCOUNTER — Encounter: Payer: Self-pay | Admitting: *Deleted

## 2016-05-08 LAB — BASIC METABOLIC PANEL
BUN / CREAT RATIO: 20 (ref 12–28)
BUN: 22 mg/dL (ref 8–27)
CALCIUM: 9 mg/dL (ref 8.7–10.3)
CO2: 25 mmol/L (ref 18–29)
CREATININE: 1.11 mg/dL — AB (ref 0.57–1.00)
Chloride: 95 mmol/L — ABNORMAL LOW (ref 96–106)
GFR, EST AFRICAN AMERICAN: 61 mL/min/{1.73_m2} (ref >59)
GFR, EST NON AFRICAN AMERICAN: 53 mL/min/{1.73_m2} — AB (ref >59)
Glucose: 172 mg/dL — ABNORMAL HIGH (ref 65–99)
Potassium: 3.6 mmol/L (ref 3.5–5.2)
Sodium: 140 mmol/L (ref 134–144)

## 2016-05-14 ENCOUNTER — Encounter: Payer: Self-pay | Admitting: Cardiovascular Disease

## 2016-05-14 ENCOUNTER — Ambulatory Visit (INDEPENDENT_AMBULATORY_CARE_PROVIDER_SITE_OTHER): Payer: BLUE CROSS/BLUE SHIELD | Admitting: Cardiovascular Disease

## 2016-05-14 VITALS — BP 100/60 | HR 94 | Ht 62.0 in | Wt 122.5 lb

## 2016-05-14 DIAGNOSIS — I10 Essential (primary) hypertension: Secondary | ICD-10-CM | POA: Diagnosis not present

## 2016-05-14 DIAGNOSIS — I5022 Chronic systolic (congestive) heart failure: Secondary | ICD-10-CM | POA: Diagnosis not present

## 2016-05-14 MED ORDER — TORSEMIDE 20 MG PO TABS: 20.0000 mg | ORAL_TABLET | Freq: Every day | ORAL | 3 refills | Status: DC

## 2016-05-14 NOTE — Progress Notes (Signed)
Cardiology Office Note   Date:  05/14/2016   ID:  Natosha Bou, DOB 08-Apr-1951, MRN [id number]  PCP:  Glendon Axe, MD  Cardiologist:   Kathlyn Sacramento, MD   Chief Complaint  Patient presents with  . other    Follow up from Echo. Meds reviewed by the pt. verbally. "doing well."      History of Present Illness: Bethany Gillespie is a 65 y.o. female who presents for A follow-up visit regarding chronic systolic heart failure due to nonischemic cardiomyopathy. This was diagnosed in July 2017 with an ejection fraction of 25-30%. Cardiac catheterization showed small vessel coronary artery disease with no obstructive disease affecting the main arteries. She had previous dual-chamber pacemaker placement in August 2016 in [place]. She has other chronic medical conditions that include diabetes mellitus and essential hypertension. Repeat echocardiogram in September showed improvement in ejection fraction to 45-50%. During last visit with a heart failure clinic, she was switched from furosemide to torsemide due to abdominal swelling and weight gain. Her weight went down 7 pounds since then. She has been doing well and denies any chest pain, shortness of breath or leg edema. No dizziness or syncope.   Past Medical History:  Diagnosis Date  . 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 s/p Engineer, agricultural PPM in 03/2015  . Bell's palsy   . Bradycardia   . CAD (coronary artery disease)    a. 02/2016 Cath: LM nl, LAD 30p, D1 small w/ severe ostial/mid dzs, LCX nl, RCA nl, EF <25%, glob HK.  . Chronic combined systolic and diastolic CHF (congestive heart failure) ([place])    a. Echo in 03/2015: EF 55-60%, Grade 1 DD;  b. 02/2016 Echo: 25-30%, mod LVH, Gr1 DD, mild MR, mildly dil LA, prominent apical trabeculations.  . Diabetes ([place])   . Hernia of abdominal wall   . HTN (hypertension)   . Hypertension   . NICM (nonischemic cardiomyopathy) ([place])    a. 02/2016 relatively non-obstructive cath, EF 25-30%  by echo.    Past Surgical History:  Procedure Laterality Date  . CARDIAC CATHETERIZATION Bilateral 03/01/2016   Procedure: Left Heart Cath and Coronary Angiography;  Surgeon: Minna Merritts, MD;  Location: [facility];  Service: Cardiovascular;  Laterality: Bilateral;  . HERNIA REPAIR    . PACEMAKER INSERTION    . PACEMAKER INSERTION       Current Outpatient Prescriptions  Medication Sig Dispense Refill  . carvedilol (COREG) 3.125 MG tablet Take 1 tablet (3.125 mg total) by mouth 2 (two) times daily with a meal. 60 tablet 3  . insulin aspart (NOVOLOG) 100 UNIT/ML injection Inject 5 Units into the skin 3 (three) times daily with meals.     [name] LEVEMIR FLEXTOUCH 100 UNIT/ML Pen Inject 18 Units into the skin every evening.    . Melatonin 3 MG CAPS Take 1 capsule by mouth at bedtime as needed.    . potassium chloride SA (K-DUR,KLOR-CON) 20 MEQ tablet Take 1 tablet (20 mEq total) by mouth daily. 30 tablet 5  . sacubitril-valsartan (ENTRESTO) 24-26 MG Take 1 tablet by mouth 2 (two) times daily. 60 tablet 0  . spironolactone (ALDACTONE) 25 MG tablet Take 1 tablet (25 mg total) by mouth daily. 30 tablet 3  . torsemide (DEMADEX) 20 MG tablet Take 1 tablet (20 mg total) by mouth daily. 30 tablet 3   No current facility-administered medications for this visit.     Allergies:   Bee venom  Social History:  The patient  reports that she has never smoked. She has never used smokeless tobacco. She reports that she does not drink alcohol or use drugs.   Family History:  The patient's family history includes Diabetes in her brother; Diabetes Mellitus I in her grandchild; Stroke in her father.    ROS:  Please see the history of present illness.   Otherwise, review of systems are positive for none.   All other systems are reviewed and negative.    PHYSICAL EXAM: VS:  BP 100/60 (BP Location: Left Arm, Patient Position: Sitting, Cuff Size: Normal)   Pulse 94   Ht 5\' 2"  (1.575 m)   Wt  122 lb 8 oz (55.6 kg)   BMI 22.41 kg/m  , BMI Body mass index is 22.41 kg/m. GEN: Well nourished, well developed, in no acute distress  HEENT: normal  Neck: no JVD, carotid bruits, or masses Cardiac: RRR; no murmurs, rubs, or gallops,no edema  Respiratory:  clear to auscultation bilaterally, normal work of breathing GI: soft, nontender, nondistended, + BS MS: no deformity or atrophy  Skin: warm and dry, no rash Neuro:  Strength and sensation are intact Psych: euthymic mood, full affect   EKG:  EKG is not ordered today.    Recent Labs: 02/26/2016: B Natriuretic Peptide 1,562.0 02/27/2016: Hemoglobin 14.5; Platelets 168 02/29/2016: Magnesium 2.0 05/07/2016: BUN 22; Creatinine, Ser 1.11; Potassium 3.6; Sodium 140    Lipid Panel No results found for: CHOL, TRIG, HDL, CHOLHDL, VLDL, LDLCALC, LDLDIRECT    Wt Readings from Last 3 Encounters:  05/14/16 122 lb 8 oz (55.6 kg)  04/19/16 127 lb 9.6 oz (57.9 kg)  04/18/16 129 lb (58.5 kg)       No flowsheet data found.    ASSESSMENT AND PLAN:  1.  Chronic systolic heart failure: Most recent ejection fraction was 45-50%. She is currently New York Heart Association class II. She might be mildly volume depleted based on her weight trend. I elected to decrease the dose of torsemide to 20 mg once daily. Check basic metabolic profile in one week. She is otherwise on optimal medical therapy and nondominant the medications can be increased due to relatively low blood pressure.  2. Essential hypertension: Blood pressure is controlled on current medications.  3. History of second-degree AV block status post dual-chamber pacemaker placement: This is managed by her EP cardiologist in [place].  4. Diabetes mellitus, managed by primary care physician.  Disposition:   FU with me in 3 months  Signed,  Kathlyn Sacramento, MD  05/14/2016 8:32 AM    [facility]

## 2016-05-14 NOTE — Patient Instructions (Signed)
Medication Instructions:  Your physician has recommended you make the following change in your medication:  DECREASE torsemide to 20mg  once daily   Labwork: BMET in one week  Testing/Procedures: none  Follow-Up: Your physician recommends that you schedule a follow-up appointment in: 3 months with Dr. Fletcher Anon.   Any Other Special Instructions Will Be Listed Below (If Applicable).     If you need a refill on your cardiac medications before your next appointment, please call your pharmacy.

## 2016-05-21 ENCOUNTER — Other Ambulatory Visit (INDEPENDENT_AMBULATORY_CARE_PROVIDER_SITE_OTHER): Payer: Self-pay

## 2016-05-21 DIAGNOSIS — I5022 Chronic systolic (congestive) heart failure: Secondary | ICD-10-CM

## 2016-05-22 ENCOUNTER — Other Ambulatory Visit: Payer: Self-pay | Admitting: Cardiovascular Disease

## 2016-05-22 LAB — BASIC METABOLIC PANEL
BUN / CREAT RATIO: 15 (ref 12–28)
BUN: 14 mg/dL (ref 8–27)
CO2: 25 mmol/L (ref 18–29)
CREATININE: 0.96 mg/dL (ref 0.57–1.00)
Calcium: 9.1 mg/dL (ref 8.7–10.3)
Chloride: 96 mmol/L (ref 96–106)
GFR calc Af Amer: 73 mL/min/{1.73_m2} (ref >59)
GFR, EST NON AFRICAN AMERICAN: 63 mL/min/{1.73_m2} (ref >59)
Glucose: 249 mg/dL — ABNORMAL HIGH (ref 65–99)
Potassium: 3.6 mmol/L (ref 3.5–5.2)
SODIUM: 140 mmol/L (ref 134–144)

## 2016-06-26 ENCOUNTER — Telehealth: Payer: Self-pay | Admitting: Cardiovascular Disease

## 2016-06-26 NOTE — Telephone Encounter (Signed)
Patient insurance bcbs termed 04-12-2016.  Claim Rejected for 05/21/16 DOS.   Attempted to contact patient to enter alternate insurance data. No ans no vm.  06/26/16 [facility]

## 2016-07-18 ENCOUNTER — Ambulatory Visit: Payer: BLUE CROSS/BLUE SHIELD | Admitting: Family

## 2016-09-05 ENCOUNTER — Telehealth: Payer: Self-pay | Admitting: Cardiovascular Disease

## 2016-09-05 NOTE — Telephone Encounter (Signed)
Prior authorization submitted for Entresto 24-26 mg

## 2016-09-06 ENCOUNTER — Other Ambulatory Visit: Payer: Self-pay | Admitting: *Deleted

## 2016-09-07 ENCOUNTER — Telehealth: Payer: Self-pay | Admitting: Cardiovascular Disease

## 2016-09-07 MED ORDER — SACUBITRIL-VALSARTAN 24-26 MG PO TABS: 1.0000 | ORAL_TABLET | Freq: Two times a day (BID) | ORAL | 3 refills | Status: DC

## 2016-09-07 NOTE — Telephone Encounter (Addendum)
error 

## 2016-09-10 ENCOUNTER — Telehealth: Payer: Self-pay | Admitting: Cardiovascular Disease

## 2016-09-10 NOTE — Telephone Encounter (Signed)
Pt called to notify us that entresto has been denied by insurance. Informed pt I have an appeal awaiting Dr. Tyrell Antonio completion and confirmed she has entresto samples while awaiting response.  Pt EF 45-50% on Sept 2017 echo. Will call pt w/MD recommendations. Pt agreeable w/plan.

## 2016-10-03 ENCOUNTER — Other Ambulatory Visit: Payer: Self-pay

## 2016-10-03 MED ORDER — SACUBITRIL-VALSARTAN 24-26 MG PO TABS: 1.0000 | ORAL_TABLET | Freq: Two times a day (BID) | ORAL | 3 refills | Status: DC

## 2016-10-03 NOTE — Telephone Encounter (Signed)
Requested Prescriptions   Signed Prescriptions Disp Refills  . sacubitril-valsartan (ENTRESTO) 24-26 MG 60 tablet 3    Sig: Take 1 tablet by mouth 2 (two) times daily.    Authorizing Provider: Kathlyn Sacramento A    Ordering User: Janan Ridge

## 2016-10-04 ENCOUNTER — Telehealth: Payer: Self-pay | Admitting: Physician Assistant

## 2016-10-04 ENCOUNTER — Telehealth: Payer: Self-pay | Admitting: Cardiovascular Disease

## 2016-10-04 NOTE — Telephone Encounter (Signed)
[employer] is calling regarding PA for pt for Entresto. Reference key [id number].

## 2016-10-04 NOTE — Telephone Encounter (Signed)
S/w [employer] who had called to inquire if we needed assistance completing PA for [facility]. All questions were answered. Awaiting Dr. Tyrell Antonio electronic signature.

## 2016-10-04 NOTE — Telephone Encounter (Signed)
Pt currently taking entresto. Awaiting PA approval.  Per Christell Faith, PA-C, d/c entresto d/t improved EF of 45-50% and soft BP. S/w pt who verbalized understanding.  Appt scheduled March 12 with Dr. Fletcher Anon.

## 2016-10-08 ENCOUNTER — Other Ambulatory Visit: Payer: Self-pay

## 2016-10-08 MED ORDER — SACUBITRIL-VALSARTAN 24-26 MG PO TABS: 1.0000 | ORAL_TABLET | Freq: Two times a day (BID) | ORAL | 3 refills | Status: DC

## 2016-10-08 NOTE — Telephone Encounter (Signed)
Requested Prescriptions   Signed Prescriptions Disp Refills  . sacubitril-valsartan (ENTRESTO) 24-26 MG 60 tablet 3    Sig: Take 1 tablet by mouth 2 (two) times daily.    Authorizing Provider: Kathlyn Sacramento A    Ordering User: Janan Ridge

## 2016-10-10 ENCOUNTER — Other Ambulatory Visit: Payer: Self-pay

## 2016-10-10 MED ORDER — SACUBITRIL-VALSARTAN 24-26 MG PO TABS: 1.0000 | ORAL_TABLET | Freq: Two times a day (BID) | ORAL | 5 refills | Status: DC

## 2016-10-15 ENCOUNTER — Other Ambulatory Visit: Payer: Self-pay

## 2016-10-15 MED ORDER — SACUBITRIL-VALSARTAN 24-26 MG PO TABS: 1.0000 | ORAL_TABLET | Freq: Two times a day (BID) | ORAL | 3 refills | Status: DC

## 2016-10-16 ENCOUNTER — Telehealth: Payer: Self-pay | Admitting: Cardiovascular Disease

## 2016-10-16 NOTE — Telephone Encounter (Signed)
Per verbal from Dr. Fletcher Anon, pt needs to continue [facility]. Pt agreeable and will pick up samples and $10/month prescription card at our front desk. She has new insurance and will have card scanned when she comes for samples. She will notify us if $10 card works.

## 2016-10-18 NOTE — Telephone Encounter (Signed)
Elizabeth calling from cover my meds to check on PA for [facility] Please call.  [phone]   Reference [id number]

## 2016-10-19 NOTE — Telephone Encounter (Signed)
Entresto 24-26mg  approved 10/19/16 through 08/12/2038. Notified patient.

## 2016-10-22 ENCOUNTER — Ambulatory Visit (INDEPENDENT_AMBULATORY_CARE_PROVIDER_SITE_OTHER): Payer: BLUE CROSS/BLUE SHIELD | Admitting: Cardiovascular Disease

## 2016-10-22 ENCOUNTER — Encounter: Payer: Self-pay | Admitting: Cardiovascular Disease

## 2016-10-22 VITALS — BP 130/82 | HR 83 | Ht 62.0 in | Wt 128.0 lb

## 2016-10-22 DIAGNOSIS — I5022 Chronic systolic (congestive) heart failure: Secondary | ICD-10-CM | POA: Diagnosis not present

## 2016-10-22 DIAGNOSIS — Z950 Presence of cardiac pacemaker: Secondary | ICD-10-CM | POA: Diagnosis not present

## 2016-10-22 DIAGNOSIS — I10 Essential (primary) hypertension: Secondary | ICD-10-CM

## 2016-10-22 MED ORDER — SACUBITRIL-VALSARTAN 24-26 MG PO TABS: 1.0000 | ORAL_TABLET | Freq: Two times a day (BID) | ORAL | 3 refills | Status: DC

## 2016-10-22 NOTE — Patient Instructions (Signed)
Medication Instructions:  Your physician recommends that you continue on your current medications as directed. Please refer to the Current Medication list given to you today. CONTINUE taking entresto twice daily  Labwork: none  Testing/Procedures: none  Follow-Up: Your physician wants you to follow-up in: six months with Dr. Fletcher Anon.  You will receive a reminder letter in the mail two months in advance. If you don't receive a letter, please call our office to schedule the follow-up appointment.   Any Other Special Instructions Will Be Listed Below (If Applicable). New patient appointment with Dr. Caryl Comes for possible pacemaker.      If you need a refill on your cardiac medications before your next appointment, please call your pharmacy.

## 2016-10-22 NOTE — Progress Notes (Signed)
Cardiology Office Note   Date:  10/22/2016   ID:  Bethany Gillespie, DOB 09/01/1950, MRN [id number]  PCP:  Glendon Axe, MD  Cardiologist:   Kathlyn Sacramento, MD   Chief Complaint  Patient presents with  . other    3 month follow up. Meds reviewed by the pt. verbally. Pt. c/o cramping in left leg at night.       History of Present Illness: Bethany Gillespie is a 66 y.o. female who presents for a follow-up visit regarding chronic systolic heart failure due to nonischemic cardiomyopathy. This was diagnosed in July 2017 with an ejection fraction of 25-30%. Cardiac catheterization showed small vessel coronary artery disease with no obstructive disease affecting the main arteries. She had previous dual-chamber pacemaker placement in August 2016 in [place]. She has other chronic medical conditions that include diabetes mellitus and essential hypertension. Repeat echocardiogram in September showed improvement in ejection fraction to 45-50%. She has been doing well from a cardiac standpoint with no chest pain, shortness of breathOr leg edema. She has been taking Entresto once daily instead of twice daily because the medication was initially not approved by her insurance and she was worried about the cost.   Past Medical History:  Diagnosis Date  . 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 s/p Engineer, agricultural PPM in 03/2015  . Bell's palsy   . Bradycardia   . CAD (coronary artery disease)    a. 02/2016 Cath: LM nl, LAD 30p, D1 small w/ severe ostial/mid dzs, LCX nl, RCA nl, EF <25%, glob HK.  . Chronic combined systolic and diastolic CHF (congestive heart failure) ([place])    a. Echo in 03/2015: EF 55-60%, Grade 1 DD;  b. 02/2016 Echo: 25-30%, mod LVH, Gr1 DD, mild MR, mildly dil LA, prominent apical trabeculations.  . Diabetes ([place])   . Hernia of abdominal wall   . HTN (hypertension)   . Hypertension   . NICM (nonischemic cardiomyopathy) ([place])    a. 02/2016 relatively non-obstructive cath, EF  25-30% by echo.    Past Surgical History:  Procedure Laterality Date  . CARDIAC CATHETERIZATION Bilateral 03/01/2016   Procedure: Left Heart Cath and Coronary Angiography;  Surgeon: Minna Merritts, MD;  Location: [facility];  Service: Cardiovascular;  Laterality: Bilateral;  . HERNIA REPAIR    . PACEMAKER INSERTION    . PACEMAKER INSERTION       Current Outpatient Prescriptions  Medication Sig Dispense Refill  . carvedilol (COREG) 3.125 MG tablet Take 1 tablet (3.125 mg total) by mouth 2 (two) times daily with a meal. 60 tablet 3  . insulin aspart (NOVOLOG) 100 UNIT/ML injection Inject 5 Units into the skin 3 (three) times daily with meals.     [name] LEVEMIR FLEXTOUCH 100 UNIT/ML Pen Inject 18 Units into the skin every evening.    . potassium chloride SA (K-DUR,KLOR-CON) 20 MEQ tablet Take 1 tablet (20 mEq total) by mouth daily. 30 tablet 5  . sacubitril-valsartan (ENTRESTO) 24-26 MG Take 1 tablet by mouth 2 (two) times daily. (Patient taking differently: Take 1 tablet by mouth daily. ) 60 tablet 3  . spironolactone (ALDACTONE) 25 MG tablet Take 1 tablet (25 mg total) by mouth daily. 30 tablet 3  . torsemide (DEMADEX) 20 MG tablet Take 1 tablet (20 mg total) by mouth daily. 30 tablet 3   No current facility-administered medications for this visit.     Allergies:   Bee venom    Social History:  The patient  reports that she has never smoked. She has never used smokeless tobacco. She reports that she does not drink alcohol or use drugs.   Family History:  The patient's family history includes Diabetes in her brother; Diabetes Mellitus I in her grandchild; Stroke in her father.    ROS:  Please see the history of present illness.   Otherwise, review of systems are positive for none.   All other systems are reviewed and negative.    PHYSICAL EXAM: VS:  BP 130/82 (BP Location: Left Arm, Patient Position: Sitting, Cuff Size: Normal)   Pulse 83   Ht 5\' 2"  (1.575 m)   Wt 128 lb  (58.1 kg)   BMI 23.41 kg/m  , BMI Body mass index is 23.41 kg/m. GEN: Well nourished, well developed, in no acute distress  HEENT: normal  Neck: no JVD, carotid bruits, or masses Cardiac: RRR; no murmurs, rubs, or gallops,no edema  Respiratory:  clear to auscultation bilaterally, normal work of breathing GI: soft, nontender, nondistended, + BS MS: no deformity or atrophy  Skin: warm and dry, no rash Neuro:  Strength and sensation are intact Psych: euthymic mood, full affect   EKG:  EKG is ordered today. EKG showed atrial sensed ventricular paced rhythm.   Recent Labs: 02/26/2016: B Natriuretic Peptide 1,562.0 02/27/2016: Hemoglobin 14.5; Platelets 168 02/29/2016: Magnesium 2.0 05/21/2016: BUN 14; Creatinine, Ser 0.96; Potassium 3.6; Sodium 140    Lipid Panel No results found for: CHOL, TRIG, HDL, CHOLHDL, VLDL, LDLCALC, LDLDIRECT    Wt Readings from Last 3 Encounters:  10/22/16 128 lb (58.1 kg)  05/14/16 122 lb 8 oz (55.6 kg)  04/19/16 127 lb 9.6 oz (57.9 kg)       No flowsheet data found.    ASSESSMENT AND PLAN:  1.  Chronic systolic heart failure: Most recent ejection fraction was 45-50%. She is currently New York Heart Association class II.  She appears to be euvolemic on small dose torsemide. I instructed her to start taking Entresto twice daily and not once daily. She is otherwise on optimal medical therapy. I reviewed her labs in January which showed normal renal function and electrolytes.  2. Essential hypertension: Blood pressure is controlled on current medications.  3. History of second-degree AV block status post dual-chamber pacemaker placement: This was previously managed by her EP cardiologist in [place]. However, her insurance doesn't cover that anymore. I'm referring her to see Dr. Caryl Comes for this.  4. Diabetes mellitus, managed by primary care physician.  Disposition:   FU with me in 6 months  Signed,  Kathlyn Sacramento, MD  10/22/2016 11:03 AM      Bethany Gillespie

## 2016-11-06 ENCOUNTER — Encounter: Payer: Self-pay | Admitting: Internal Medicine

## 2016-11-06 ENCOUNTER — Ambulatory Visit (INDEPENDENT_AMBULATORY_CARE_PROVIDER_SITE_OTHER): Payer: BLUE CROSS/BLUE SHIELD | Admitting: Internal Medicine

## 2016-11-06 VITALS — BP 126/74 | HR 88 | Ht 61.0 in | Wt 131.0 lb

## 2016-11-06 DIAGNOSIS — I5022 Chronic systolic (congestive) heart failure: Secondary | ICD-10-CM

## 2016-11-06 DIAGNOSIS — I428 Other cardiomyopathies: Secondary | ICD-10-CM

## 2016-11-06 DIAGNOSIS — Z950 Presence of cardiac pacemaker: Secondary | ICD-10-CM

## 2016-11-06 DIAGNOSIS — I441 Atrioventricular block, second degree: Secondary | ICD-10-CM

## 2016-11-06 MED ORDER — TORSEMIDE 20 MG PO TABS: 20.0000 mg | ORAL_TABLET | ORAL | Status: DC

## 2016-11-06 MED ORDER — ATORVASTATIN CALCIUM 20 MG PO TABS: 20.0000 mg | ORAL_TABLET | Freq: Every day | ORAL | 3 refills | Status: DC

## 2016-11-06 MED ORDER — POTASSIUM CHLORIDE CRYS ER 20 MEQ PO TBCR: 20.0000 meq | EXTENDED_RELEASE_TABLET | ORAL | Status: DC

## 2016-11-06 NOTE — Progress Notes (Signed)
ELECTROPHYSIOLOGY CONSULT NOTE  Patient ID: Bethany Gillespie, MRN: [id number], DOB/AGE: 12/03/1950 66 y.o. Admit date: (Not on file) Date of Consult: 11/06/2016  Primary Physician: Glendon Axe, MD Primary Cardiologist: MA Consulting Physician MA  Chief Complaint: establish pacemaker followup2   HPI Bethany Gillespie is a 66 y.o. female referred to establish pacer  [place]   followup implanted [place] In 2016 following an episode of syncope. She had presented with Mobitz 2 heart block.  She ended up with interval congestive heart failure now treated with guidelines directed therapy  Currently denies chest discomfort shortness of breath with activity peripheral edema or abdominal swelling.  No interval syncope  She has hx of CAD- small vessel with some degree of  LV dysfunction DATE TEST    8/16    Echo   EF 55-60 %   7/17    Cath   EF 25 % Small vessel D1  9/17 Echo EF 40-45%     She reports a family history of bleeding on statins. Reviewing hemolytic anemia and thrombocytopenia can be associated with it. We will   Past Medical History:  Diagnosis Date  . 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 s/p Engineer, agricultural PPM in 03/2015  . Bell's palsy   . Bradycardia   . CAD (coronary artery disease)    a. 02/2016 Cath: LM nl, LAD 30p, D1 small w/ severe ostial/mid dzs, LCX nl, RCA nl, EF <25%, glob HK.  . Chronic combined systolic and diastolic CHF (congestive heart failure) ([place])    a. Echo in 03/2015: EF 55-60%, Grade 1 DD;  b. 02/2016 Echo: 25-30%, mod LVH, Gr1 DD, mild MR, mildly dil LA, prominent apical trabeculations.  . Diabetes ([place])   . Hernia of abdominal wall   . HTN (hypertension)   . Hypertension   . NICM (nonischemic cardiomyopathy) ([place])    a. 02/2016 relatively non-obstructive cath, EF 25-30% by echo.      Surgical History:  Past Surgical History:  Procedure Laterality Date  . CARDIAC CATHETERIZATION Bilateral 03/01/2016   Procedure: Left  Heart Cath and Coronary Angiography;  Surgeon: Minna Merritts, MD;  Location: [facility];  Service: Cardiovascular;  Laterality: Bilateral;  . HERNIA REPAIR    . PACEMAKER INSERTION    . PACEMAKER INSERTION       Home Meds: Prior to Admission medications   Medication Sig Start Date End Date Taking? Authorizing Provider  carvedilol (COREG) 3.125 MG tablet Take 1 tablet (3.125 mg total) by mouth 2 (two) times daily with a meal. 04/02/16   Minna Merritts, MD  insulin aspart (NOVOLOG) 100 UNIT/ML injection Inject 5 Units into the skin 3 (three) times daily with meals.     Historical Provider, MD  LEVEMIR FLEXTOUCH 100 UNIT/ML Pen Inject 18 Units into the skin every evening.    Historical Provider, MD  potassium chloride SA (K-DUR,KLOR-CON) 20 MEQ tablet Take 1 tablet (20 mEq total) by mouth daily. 03/15/16   Alisa Graff, FNP  sacubitril-valsartan (ENTRESTO) 24-26 MG Take 1 tablet by mouth 2 (two) times daily. 10/22/16   Wellington Hampshire, MD  spironolactone (ALDACTONE) 25 MG tablet Take 1 tablet (25 mg total) by mouth daily. 04/02/16   Minna Merritts, MD  torsemide (DEMADEX) 20 MG tablet Take 1 tablet (20 mg total) by mouth daily. 05/14/16 10/22/16  Wellington Hampshire, MD    Allergies:  Allergies  Allergen Reactions  . Bee Venom  Anaphylaxis    Social History   Social History  . Marital status: Widowed    Spouse name: N/A  . Number of children: N/A  . Years of education: N/A   Occupational History  . Not on file.   Social History Main Topics  . Smoking status: Never Smoker  . Smokeless tobacco: Never Used  . Alcohol use No  . Drug use: No  . Sexual activity: Not on file   Other Topics Concern  . Not on file   Social History Narrative  . No narrative on file     Family History  Problem Relation Age of Onset  . Diabetes Mellitus I Grandchild   . Stroke Father   . Diabetes Brother      ROS:  Please see the history of present illness.     All other systems  reviewed and negative.    Physical Exam:  Blood pressure 126/74, pulse 88, height 5\' 1"  (1.549 m), weight 131 lb (59.4 kg). General: Well developed, well nourished female in no acute distress. Head: Normocephalic, atraumatic, sclera non-icteric, no xanthomas, nares are without discharge. EENT: normal  Lymph Nodes:  none Neck: Negative for carotid bruits. JVD not elevated. Back:without scoliosis kyphosis Device pocket well healed; without hematoma or erythema.  There is no tethering  Lungs: Clear bilaterally to auscultation without wheezes, rales, or rhonchi. Breathing is unlabored. Heart: RRR with S1 S2. No  murmur . No rubs, or gallops appreciated. Abdomen: Soft, non-tender, non-distended with normoactive bowel sounds. No hepatomegaly. No rebound/guarding. No obvious abdominal masses. Msk:  Strength and tone appear normal for age. Extremities: No clubbing or cyanosis. No  edema.  Distal pedal pulses are 2+ and equal bilaterally. Skin: Warm and Dry Neuro: Alert and oriented X 3. CN III-XII intact Grossly normal sensory and motor function . Psych:  Responds to questions appropriately with a normal affect.      Labs: Cardiac Enzymes No results for input(s): CKTOTAL, CKMB, TROPONINI in the last 72 hours. CBC Lab Results  Component Value Date   WBC 7.1 02/27/2016   HGB 14.5 02/27/2016   HCT 44.2 02/27/2016   MCV 81.0 02/27/2016   PLT 168 02/27/2016   PROTIME: No results for input(s): LABPROT, INR in the last 72 hours. Chemistry No results for input(s): NA, K, CL, CO2, BUN, CREATININE, CALCIUM, PROT, BILITOT, ALKPHOS, ALT, AST, GLUCOSE in the last 168 hours.  Invalid input(s): LABALBU Lipids No results found for: CHOL, HDL, LDLCALC, TRIG BNP No results found for: PROBNP Thyroid Function Tests: No results for input(s): TSH, T4TOTAL, T3FREE, THYROIDAB in the last 72 hours.  Invalid input(s): FREET3 Miscellaneous No results found for: DDIMER  Radiology/Studies:  No results  found.  EKG: Personally reviewed  P-synchronous/ AV  pacing   Assessment and Plan:  Complete heart block-device dependent  Pacemaker [employer] The patient's device was interrogated and the information was fully reviewed.  The device was reprogrammed to maximize longevity  NICM resolved  DM with elevated HgbA1c >12 With coronary artery disease  Elevated LDL  PVC  Syncope  Congestive heart failure-chronic-class II-systolic/diastolic   The patient has had no recurrent syncope.  Device was reprogrammed to maximize longevity.  She's now device dependent  She has infrequent PVCs  Cardiomyopathy is of concern; its original cause never had been identified and with ongoing pacing she is at risk for pacemaker associated deterioration . We will plan to continue her on guideline directed therapy. In this regard her potassium 1/18 was  3.7 on Aldactone.  We'll anticipate repeat echocardiogram in 3 months  Her hemoglobin A1c is very high; I've encouraged her to continue this.  Euvolemic continue current meds;  we will decrease her diuretics and her potassium to every other day Is a class I recommendation for statin therapy given her coronary disease with her diabetes. We will begin on statin therapy. With her family history of bleeding on statins, we will check a CBC in a few weeks      [name]

## 2016-11-06 NOTE — Patient Instructions (Signed)
Medication Instructions: - Your physician has recommended you make the following change in your medication:  1) Start lipitor (atorvastatin) 20 mg- take one tablet by mouth once daily 2) Decrease demadex (torsemide) 20 mg- take one tablet by mouth once EVERY OTHER day 3) Decrease potassium 20 meq- take one tablet by mouth once EVERY OTHER day  Labwork: - Your physician recommends that you return for lab work in: 2 weeks- CBC   Procedures/Testing: - Your physician has requested that you have an echocardiogram- just prior to 3 month follow up with Dr. Caryl Comes. Echocardiography is a painless test that uses sound waves to create images of your heart. It provides your doctor with information about the size and shape of your heart and how well your heart's chambers and valves are working. This procedure takes approximately one hour. There are no restrictions for this procedure.  Follow-Up: - Your physician recommends that you schedule a follow-up appointment in: 3 months with Dr. Caryl Comes.   Any Additional Special Instructions Will Be Listed Below (If Applicable).     If you need a refill on your cardiac medications before your next appointment, please call your pharmacy.

## 2016-11-20 ENCOUNTER — Other Ambulatory Visit: Payer: Self-pay | Admitting: Internal Medicine

## 2016-11-20 ENCOUNTER — Other Ambulatory Visit (INDEPENDENT_AMBULATORY_CARE_PROVIDER_SITE_OTHER): Payer: BLUE CROSS/BLUE SHIELD

## 2016-11-20 DIAGNOSIS — I428 Other cardiomyopathies: Secondary | ICD-10-CM

## 2016-11-20 DIAGNOSIS — I5022 Chronic systolic (congestive) heart failure: Secondary | ICD-10-CM

## 2016-11-21 LAB — CBC WITH DIFFERENTIAL/PLATELET
BASOS: 1 %
Basophils Absolute: 0.1 10*3/uL (ref 0.0–0.2)
EOS (ABSOLUTE): 0.3 10*3/uL (ref 0.0–0.4)
EOS: 4 %
HEMATOCRIT: 34.7 % (ref 34.0–46.6)
HEMOGLOBIN: 12 g/dL (ref 11.1–15.9)
IMMATURE GRANS (ABS): 0 10*3/uL (ref 0.0–0.1)
Immature Granulocytes: 0 %
LYMPHS: 36 %
Lymphocytes Absolute: 2.3 10*3/uL (ref 0.7–3.1)
MCH: 26.8 pg (ref 26.6–33.0)
MCHC: 34.6 g/dL (ref 31.5–35.7)
MCV: 78 fL — AB (ref 79–97)
MONOCYTES: 5 %
Monocytes Absolute: 0.3 10*3/uL (ref 0.1–0.9)
NEUTROS ABS: 3.5 10*3/uL (ref 1.4–7.0)
Neutrophils: 54 %
Platelets: 195 10*3/uL (ref 150–379)
RBC: 4.48 x10E6/uL (ref 3.77–5.28)
RDW: 14.7 % (ref 12.3–15.4)
WBC: 6.4 10*3/uL (ref 3.4–10.8)

## 2016-11-26 ENCOUNTER — Other Ambulatory Visit: Payer: Self-pay | Admitting: Cardiovascular Disease

## 2016-12-05 LAB — CUP PACEART INCLINIC DEVICE CHECK
Brady Statistic RV Percent Paced: 98 %
Implantable Lead Implant Date: 20160810
Implantable Lead Location: 753859
Implantable Lead Model: 7741
Implantable Lead Serial Number: 640659
Implantable Pulse Generator Implant Date: 20160810
Lead Channel Impedance Value: 646 Ohm
Lead Channel Impedance Value: 775 Ohm
Lead Channel Pacing Threshold Amplitude: 0.6 V
Lead Channel Pacing Threshold Pulse Width: 0.5 ms
Lead Channel Pacing Threshold Pulse Width: 0.5 ms
Lead Channel Setting Pacing Amplitude: 2 V
Lead Channel Setting Pacing Amplitude: 2.4 V
Lead Channel Setting Pacing Pulse Width: 0.5 ms
Lead Channel Setting Sensing Sensitivity: 2.5 mV
MDC IDC LEAD IMPLANT DT: 20160810
MDC IDC LEAD LOCATION: 753860
MDC IDC LEAD SERIAL: 682646
MDC IDC MSMT LEADCHNL RA PACING THRESHOLD AMPLITUDE: 0.4 V
MDC IDC MSMT LEADCHNL RA SENSING INTR AMPL: 3.9 mV
MDC IDC PG SERIAL: 722311
MDC IDC SESS DTM: 20180327040000
MDC IDC STAT BRADY RA PERCENT PACED: 1 % — AB

## 2016-12-27 ENCOUNTER — Encounter: Payer: Self-pay | Admitting: Emergency Medicine

## 2016-12-27 ENCOUNTER — Telehealth: Payer: Self-pay | Admitting: Internal Medicine

## 2016-12-27 ENCOUNTER — Emergency Department
Admission: EM | Admit: 2016-12-27 | Discharge: 2016-12-27 | Disposition: A | Discharge status: Home / Self Care | Payer: BLUE CROSS/BLUE SHIELD | Attending: Emergency Medicine | Admitting: Emergency Medicine

## 2016-12-27 ENCOUNTER — Emergency Department: Payer: BLUE CROSS/BLUE SHIELD

## 2016-12-27 DIAGNOSIS — I2510 Atherosclerotic heart disease of native coronary artery without angina pectoris: Secondary | ICD-10-CM | POA: Insufficient documentation

## 2016-12-27 DIAGNOSIS — Z79899 Other long term (current) drug therapy: Secondary | ICD-10-CM | POA: Diagnosis not present

## 2016-12-27 DIAGNOSIS — I5042 Chronic combined systolic (congestive) and diastolic (congestive) heart failure: Secondary | ICD-10-CM | POA: Diagnosis not present

## 2016-12-27 DIAGNOSIS — Z794 Long term (current) use of insulin: Secondary | ICD-10-CM | POA: Diagnosis not present

## 2016-12-27 DIAGNOSIS — R55 Syncope and collapse: Secondary | ICD-10-CM | POA: Insufficient documentation

## 2016-12-27 DIAGNOSIS — E119 Type 2 diabetes mellitus without complications: Secondary | ICD-10-CM | POA: Diagnosis not present

## 2016-12-27 DIAGNOSIS — Z950 Presence of cardiac pacemaker: Secondary | ICD-10-CM | POA: Diagnosis not present

## 2016-12-27 DIAGNOSIS — I110 Hypertensive heart disease with heart failure: Secondary | ICD-10-CM | POA: Diagnosis not present

## 2016-12-27 LAB — CBC
HCT: 38.4 % (ref 35.0–47.0)
HEMOGLOBIN: 12.9 g/dL (ref 12.0–16.0)
MCH: 28 pg (ref 26.0–34.0)
MCHC: 33.7 g/dL (ref 32.0–36.0)
MCV: 83.2 fL (ref 80.0–100.0)
PLATELETS: 191 10*3/uL (ref 150–440)
RBC: 4.62 MIL/uL (ref 3.80–5.20)
RDW: 14.1 % (ref 11.5–14.5)
WBC: 8.7 10*3/uL (ref 3.6–11.0)

## 2016-12-27 LAB — BASIC METABOLIC PANEL
Anion gap: 9 (ref 5–15)
BUN: 26 mg/dL — ABNORMAL HIGH (ref 6–20)
CO2: 24 mmol/L (ref 22–32)
Calcium: 9.3 mg/dL (ref 8.9–10.3)
Chloride: 98 mmol/L — ABNORMAL LOW (ref 101–111)
Creatinine, Ser: 1.04 mg/dL — ABNORMAL HIGH (ref 0.44–1.00)
GFR calc Af Amer: >60 mL/min (ref >60)
GFR, EST NON AFRICAN AMERICAN: 56 mL/min — AB (ref >60)
GLUCOSE: 566 mg/dL — AB (ref 65–99)
Potassium: 4.2 mmol/L (ref 3.5–5.1)
Sodium: 131 mmol/L — ABNORMAL LOW (ref 135–145)

## 2016-12-27 LAB — TROPONIN I

## 2016-12-27 LAB — GLUCOSE, CAPILLARY
GLUCOSE-CAPILLARY: 546 mg/dL — AB (ref 65–99)
Glucose-Capillary: 205 mg/dL — ABNORMAL HIGH (ref 65–99)

## 2016-12-27 NOTE — Telephone Encounter (Signed)
Pt states she thinks she has "pulled her leads out of my pacemaker". Pt states she has passed out twice this morning. States she has an "ungodly pain" going into her left shoulder and into her neck. Please call.

## 2016-12-27 NOTE — ED Notes (Signed)
Patient reports that she had 2 syncopal episodes today.  Patient is in no obvious distress but states she has been having left shoulder/neck pain for several days.  Patient reports that she does have a pacemaker.

## 2016-12-27 NOTE — Telephone Encounter (Signed)
After consulting with Device Tech RN I requested that patient send a remote transmission. Pt was at work and unable to send a remote transmission. Device Clinic RN then informed me to tell pt to go to ER and not to drive herself. Informed pt of Device Tech RN recommendations. Pt verbalized understanding.

## 2016-12-27 NOTE — ED Notes (Signed)
Date and time results received: 12/27/16 12:37 PM (use smartphrase ".now" to insert current time)  Test: glucose Critical Value: 566  Name of Provider Notified: Dr. Jimmye Norman  Orders Received? Or Actions Taken?: Orders Received - See Orders for details

## 2016-12-27 NOTE — ED Triage Notes (Signed)
Pt reports two syncopal episodes today at work. Pt states she thinks she was out for two minutes during one of them. Pt reports left side neck and shoulder for one week. Pt has pacemaker. Pt alert and oriented in triage. No apparent distress noted.

## 2016-12-27 NOTE — ED Provider Notes (Signed)
[facility] Emergency Department Provider Note   ____________________________________________    I have reviewed the triage vital signs and the nursing notes.   HISTORY  Chief Complaint Loss of Consciousness     HPI Bethany Gillespie is a 66 y.o. female who presents with complaints of near syncope. Patient reports she was at work and reached above her head to plug in a device and became lightheaded and fell back and thinks she lost consciousness briefly. She denies injury. She does report that she has had some neck pain over the last 2 weeks which was nontraumatic in nature. She denies chest pain today. No shortness of breath. The palpitations. No nausea or vomiting. No abdominal pain. No hip pain. No back pain.   Past Medical History:  Diagnosis Date  . 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 s/p Engineer, agricultural PPM in 03/2015  . Bell's palsy   . Bradycardia   . CAD (coronary artery disease)    a. 02/2016 Cath: LM nl, LAD 30p, D1 small w/ severe ostial/mid dzs, LCX nl, RCA nl, EF <25%, glob HK.  . Chronic combined systolic and diastolic CHF (congestive heart failure) ([place])    a. Echo in 03/2015: EF 55-60%, Grade 1 DD;  b. 02/2016 Echo: 25-30%, mod LVH, Gr1 DD, mild MR, mildly dil LA, prominent apical trabeculations.  . Diabetes ([place])   . Hernia of abdominal wall   . HTN (hypertension)   . Hypertension   . NICM (nonischemic cardiomyopathy) ([place])    a. 02/2016 relatively non-obstructive cath, EF 25-30% by echo.    Patient Active Problem List   Diagnosis Date Noted  . Chronic systolic heart failure ([place]) 03/15/2016  . Diabetes ([place]) 03/15/2016  . Mobitz type II atrioventricular block 02/29/2016  . Status post placement of cardiac pacemaker 02/29/2016  . Shortness of breath   . HTN (hypertension) 02/26/2016  . Pleural effusion, bilateral 02/26/2016    Past Surgical History:  Procedure Laterality Date  . CARDIAC CATHETERIZATION Bilateral  03/01/2016   Procedure: Left Heart Cath and Coronary Angiography;  Surgeon: Minna Merritts, MD;  Location: [facility];  Service: Cardiovascular;  Laterality: Bilateral;  . HERNIA REPAIR    . PACEMAKER INSERTION    . PACEMAKER INSERTION      Prior to Admission medications   Medication Sig Start Date End Date Taking? Authorizing Provider  atorvastatin (LIPITOR) 20 MG tablet Take 1 tablet (20 mg total) by mouth daily. 11/06/16 02/04/17 Yes Deboraha Sprang, MD  carvedilol (COREG) 3.125 MG tablet Take 1 tablet (3.125 mg total) by mouth 2 (two) times daily with a meal. 04/02/16  Yes Gollan, Kathlene November, MD  insulin aspart (NOVOLOG) 100 UNIT/ML injection Inject 5 Units into the skin 3 (three) times daily with meals.    Yes [provider]  LEVEMIR FLEXTOUCH 100 UNIT/ML Pen Inject 18 Units into the skin every evening.   Yes [provider]  potassium chloride SA (K-DUR,KLOR-CON) 20 MEQ tablet Take 1 tablet (20 mEq total) by mouth every other day. 11/06/16  Yes Deboraha Sprang, MD  sacubitril-valsartan (ENTRESTO) 24-26 MG Take 1 tablet by mouth 2 (two) times daily. 10/22/16  Yes Wellington Hampshire, MD  spironolactone (ALDACTONE) 25 MG tablet TAKE 1 TABLET(25 MG) BY MOUTH DAILY 11/26/16  Yes Gollan, Kathlene November, MD  torsemide (DEMADEX) 20 MG tablet Take 1 tablet (20 mg total) by mouth every other day. 11/06/16 12/06/16  Deboraha Sprang,  MD     Allergies Bee venom  Family History  Problem Relation Age of Onset  . Diabetes Mellitus I Grandchild   . Stroke Father   . Diabetes Brother     Social History Social History  Substance Use Topics  . Smoking status: Never Smoker  . Smokeless tobacco: Never Used  . Alcohol use No    Review of Systems  Constitutional: No fever/chills Eyes: No visual changes.  ENT: No sore throat.  Cardiovascular: Denies chest pain. Respiratory: Denies shortness of breath. Gastrointestinal: No abdominal pain.  No nausea, no vomiting.     Genitourinary: Negative for dysuria. Musculoskeletal: Negative for back pain.Neck pain as above Skin: Negative for rash. Neurological: Negative for headaches or weakness   ____________________________________________   PHYSICAL EXAM:  VITAL SIGNS: ED Triage Vitals  Enc Vitals Group     BP 12/27/16 1124 (!) 165/76     Pulse Rate 12/27/16 1124 75     Resp 12/27/16 1124 18     Temp 12/27/16 1124 98.3 F (36.8 C)     Temp Source 12/27/16 1124 Oral     SpO2 12/27/16 1124 99 %     Weight 12/27/16 1126 130 lb (59 kg)     Height 12/27/16 1126 5\' 2"  (1.575 m)     Head Circumference --      Peak Flow --      Pain Score 12/27/16 1125 10     Pain Loc --      Pain Edu? --      Excl. in [place]? --     Constitutional: Alert and oriented. No acute distress. Pleasant and interactive Eyes: Conjunctivae are normal.  Head: Atraumatic. Nose: No congestion/rhinnorhea. Mouth/Throat: Mucous membranes are moist.   Neck:  Painless ROM, No vertebral tenderness to palpation, mild tenderness along the left trapezius muscle body and insertion site Cardiovascular: Normal rate, regular rhythm. Grossly normal heart sounds.  Good peripheral circulation. Respiratory: Normal respiratory effort.  No retractions. Lungs CTAB. Gastrointestinal: Soft and nontender. No distention.  No CVA tenderness. Genitourinary: deferred Musculoskeletal: No lower extremity tenderness nor edema.  Warm and well perfused Neurologic:  Normal speech and language. No gross focal neurologic deficits are appreciated.  Skin:  Skin is warm, dry and intact. No rash noted. Psychiatric: Mood and affect are normal. Speech and behavior are normal.  ____________________________________________   LABS (all labs ordered are listed, but only abnormal results are displayed)  Labs Reviewed  BASIC METABOLIC PANEL - Abnormal; Notable for the following:       Result Value   Sodium 131 (*)    Chloride 98 (*)    Glucose, Bld 566 (*)    BUN 26  (*)    Creatinine, Ser 1.04 (*)    GFR calc non Af Amer 56 (*)    All other components within normal limits  GLUCOSE, CAPILLARY - Abnormal; Notable for the following:    Glucose-Capillary 546 (*)    All other components within normal limits  GLUCOSE, CAPILLARY - Abnormal; Notable for the following:    Glucose-Capillary 205 (*)    All other components within normal limits  CBC  TROPONIN I  URINALYSIS, COMPLETE (UACMP) WITH MICROSCOPIC  CBG MONITORING, ED  CBG MONITORING, ED   ____________________________________________  EKG  ED ECG REPORT I, Lavonia Drafts, the attending physician, personally viewed and interpreted this ECG.  Date: 12/27/2016  Rhythm: Atrial sensed paced rhythm QRS Axis: normal Intervals: normal ST/T Wave abnormalities: Nonspecific Conduction Disturbances: Bundle branch  block   ____________________________________________  RADIOLOGY  Chest x-ray unremarkable pacemaker wires intact ____________________________________________   PROCEDURES  Procedure(s) performed: No    Critical Care performed: No ____________________________________________   INITIAL IMPRESSION / ASSESSMENT AND PLAN / ED COURSE  Pertinent labs & imaging results that were available during my care of the patient were reviewed by me and considered in my medical decision making (see chart for details).  Patient presents after syncopal versus near-syncopal episode today. I feel this is likely related to her elevated glucose/dehydration. We will give IV fluids perform orthostatics and reevaluate.  Clinical Course as of Dec 27 1553  Thu Dec 27, 2016  1301 Creatinine: (!) 1.04 [RK]    Clinical Course User Index [RK] Lavonia Drafts, MD    Patient notes she feels significantly better after fluids. Orthostatics unremarkable. Glucose is improved dramatically. The patient is appropriate for discharge at this time, she agrees with this plan. She knows to return if any return of her  symptoms. Family approves of this plan ____________________________________________   FINAL CLINICAL IMPRESSION(S) / ED DIAGNOSES  Final diagnoses:  Syncope and collapse      NEW MEDICATIONS STARTED DURING THIS VISIT:  Discharge Medication List as of 12/27/2016  3:21 PM       Note:  This document was prepared using Dragon voice recognition software and may include unintentional dictation errors.    Lavonia Drafts, MD 12/27/16 [phone]

## 2017-01-29 ENCOUNTER — Other Ambulatory Visit: Payer: BLUE CROSS/BLUE SHIELD

## 2017-02-05 ENCOUNTER — Encounter: Payer: Self-pay | Admitting: Internal Medicine

## 2017-02-05 ENCOUNTER — Ambulatory Visit (INDEPENDENT_AMBULATORY_CARE_PROVIDER_SITE_OTHER): Payer: BLUE CROSS/BLUE SHIELD | Admitting: Internal Medicine

## 2017-02-05 VITALS — BP 124/70 | HR 76 | Ht 62.0 in | Wt 137.0 lb

## 2017-02-05 DIAGNOSIS — I428 Other cardiomyopathies: Secondary | ICD-10-CM | POA: Diagnosis not present

## 2017-02-05 DIAGNOSIS — I442 Atrioventricular block, complete: Secondary | ICD-10-CM | POA: Diagnosis not present

## 2017-02-05 DIAGNOSIS — I5042 Chronic combined systolic (congestive) and diastolic (congestive) heart failure: Secondary | ICD-10-CM

## 2017-02-05 DIAGNOSIS — Z950 Presence of cardiac pacemaker: Secondary | ICD-10-CM

## 2017-02-05 LAB — CUP PACEART INCLINIC DEVICE CHECK
Implantable Lead Implant Date: 20160810
Implantable Lead Location: 753859
Implantable Lead Model: 7740
Implantable Lead Model: 7741
Implantable Lead Serial Number: 682646
Lead Channel Impedance Value: 619 Ohm
Lead Channel Impedance Value: 762 Ohm
Lead Channel Pacing Threshold Pulse Width: 0.5 ms
Lead Channel Sensing Intrinsic Amplitude: 4.2 mV
Lead Channel Setting Pacing Amplitude: 2 V
Lead Channel Setting Pacing Pulse Width: 0.5 ms
MDC IDC LEAD IMPLANT DT: 20160810
MDC IDC LEAD LOCATION: 753860
MDC IDC LEAD SERIAL: 640659
MDC IDC MSMT LEADCHNL RA PACING THRESHOLD AMPLITUDE: 0.4 V
MDC IDC MSMT LEADCHNL RA PACING THRESHOLD PULSEWIDTH: 0.5 ms
MDC IDC MSMT LEADCHNL RV PACING THRESHOLD AMPLITUDE: 0.7 V
MDC IDC PG IMPLANT DT: 20160810
MDC IDC PG SERIAL: 722311
MDC IDC SESS DTM: 20180626040000
MDC IDC SET LEADCHNL RV PACING AMPLITUDE: 2.4 V
MDC IDC SET LEADCHNL RV SENSING SENSITIVITY: 2.5 mV

## 2017-02-05 NOTE — Progress Notes (Signed)
.      Patient Care Team: Glendon Axe, MD as PCP - General (Internal Medicine) Alisa Graff, FNP as Nurse Practitioner (Family Medicine) Minna Merritts, MD as Consulting Physician (Cardiology) Gabriel Carina Betsey Holiday, MD as Physician Assistant (Internal Medicine)   HPI  Bethany Gillespie is a 66 y.o. female iSeen in follow-up for ischemic heart disease modest left ventricular dysfunction and a previously implanted pacemaker  2016 following an episode of syncope. She had presented with Mobitz 2 heart block.   She ended up with interval congestive heart failure now treated with guidelines directed therapy  She denies CP SOB or edema  Her brother died recently so she did not" getting her echo.   No interval syncope  She has hx of CAD- small vessel with some degree of  LV dysfunction DATE TEST    8/16  Echo   EF 55-60 %   7/17 Cath   EF 25 % Small vessel D1  9/17 Echo EF 40-45%          She reports a family history of bleeding on statins.  A brief flutters or search failed to find any correlation; indeed there is some decrease   bleeding in in patients on statins.  Date Cr K Mg  5/18  1.04 4.2              Past Medical History:  Diagnosis Date  . 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 s/p Engineer, agricultural PPM in 03/2015  . Bell's palsy   . Bradycardia   . CAD (coronary artery disease)    a. 02/2016 Cath: LM nl, LAD 30p, D1 small w/ severe ostial/mid dzs, LCX nl, RCA nl, EF <25%, glob HK.  . Chronic combined systolic and diastolic CHF (congestive heart failure) ([place])    a. Echo in 03/2015: EF 55-60%, Grade 1 DD;  b. 02/2016 Echo: 25-30%, mod LVH, Gr1 DD, mild MR, mildly dil LA, prominent apical trabeculations.  . Diabetes ([place])   . Hernia of abdominal wall   . HTN (hypertension)   . Hypertension   . NICM (nonischemic cardiomyopathy) ([place])    a. 02/2016 relatively non-obstructive cath, EF 25-30% by echo.    Past Surgical History:  Procedure Laterality Date  . CARDIAC  CATHETERIZATION Bilateral 03/01/2016   Procedure: Left Heart Cath and Coronary Angiography;  Surgeon: Minna Merritts, MD;  Location: [facility];  Service: Cardiovascular;  Laterality: Bilateral;  . HERNIA REPAIR    . PACEMAKER INSERTION    . PACEMAKER INSERTION      Current Outpatient Prescriptions  Medication Sig Dispense Refill  . atorvastatin (LIPITOR) 20 MG tablet Take 1 tablet (20 mg total) by mouth daily. 90 tablet 3  . carvedilol (COREG) 3.125 MG tablet Take 1 tablet (3.125 mg total) by mouth 2 (two) times daily with a meal. 60 tablet 3  . insulin aspart (NOVOLOG) 100 UNIT/ML injection Inject 5 Units into the skin 3 (three) times daily with meals.     [name] LEVEMIR FLEXTOUCH 100 UNIT/ML Pen Inject 18 Units into the skin every evening.    . potassium chloride SA (K-DUR,KLOR-CON) 20 MEQ tablet Take 1 tablet (20 mEq total) by mouth every other day.    . sacubitril-valsartan (ENTRESTO) 24-26 MG Take 1 tablet by mouth 2 (two) times daily. 60 tablet 3  . spironolactone (ALDACTONE) 25 MG tablet TAKE 1 TABLET(25 MG) BY MOUTH DAILY 30 tablet 0  . torsemide (DEMADEX) 20 MG tablet  Take 1 tablet (20 mg total) by mouth every other day.     No current facility-administered medications for this visit.     Allergies  Allergen Reactions  . Bee Venom Anaphylaxis      Review of Systems negative except from HPI and PMH  Physical Exam BP 124/70 (BP Location: Left Arm, Patient Position: Sitting, Cuff Size: Normal)   Pulse 76   Ht 5\' 2"  (1.575 m)   Wt 137 lb (62.1 kg)   BMI 25.06 kg/m  Well developed and well nourished in no acute distress HENT normal E scleral and icterus clear Neck Supple JVP flat; carotids brisk and full Clear to ausculation Regular rate and rhythm, no murmurs gallops or rub Soft with active bowel sounds No clubbing cyanosis  Edema Alert and oriented, grossly normal motor and sensory function Skin Warm and Dry  ECG demonstrates atrial paced rhythm at 76  with AV pacing at a left bundle branch block configuration  Assessment and  Plan   Complete heart block-device dependent  Pacemaker [employer] The patient's device was interrogated and the information was fully reviewed.  The device was reprogrammed to maximize longevity  NICM resolved  DM with elevated HgbA1c >12 With coronary artery disease  Elevated LDL   Syncope  Congestive heart failure-chronic-class II-systolic/diastolic   Euvolemic continue current meds  Will await echo  Will need foklluwp lipids when she sees Dr MA next  Tolerating Guideline directed medical therapy   Current medicines are reviewed at length with the patient today .  The patient does  have concerns regarding medicine ie bleeding on statins  Reviewed data as found .

## 2017-02-05 NOTE — Progress Notes (Signed)
ELECTROPHYSIOLOGY CONSULT NOTE  Patient ID: Bethany Gillespie, MRN: [id number], DOB/AGE: 1951/05/11 66 y.o. Admit date: (Not on file) Date of Consult: 02/05/2017  Primary Physician: Glendon Axe, MD Primary Cardiologist: MA Consulting Physician MA  Chief Complaint: establish pacemaker followup2   HPI Bethany Gillespie is a 66 y.o. female referred to establish pacer  [place]   Past Medical History:  Diagnosis Date  . 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 s/p Engineer, agricultural PPM in 03/2015  . Bell's palsy   . Bradycardia   . CAD (coronary artery disease)    a. 02/2016 Cath: LM nl, LAD 30p, D1 small w/ severe ostial/mid dzs, LCX nl, RCA nl, EF <25%, glob HK.  . Chronic combined systolic and diastolic CHF (congestive heart failure) ([place])    a. Echo in 03/2015: EF 55-60%, Grade 1 DD;  b. 02/2016 Echo: 25-30%, mod LVH, Gr1 DD, mild MR, mildly dil LA, prominent apical trabeculations.  . Diabetes ([place])   . Hernia of abdominal wall   . HTN (hypertension)   . Hypertension   . NICM (nonischemic cardiomyopathy) ([place])    a. 02/2016 relatively non-obstructive cath, EF 25-30% by echo.      Surgical History:  Past Surgical History:  Procedure Laterality Date  . CARDIAC CATHETERIZATION Bilateral 03/01/2016   Procedure: Left Heart Cath and Coronary Angiography;  Surgeon: Minna Merritts, MD;  Location: [facility];  Service: Cardiovascular;  Laterality: Bilateral;  . HERNIA REPAIR    . PACEMAKER INSERTION    . PACEMAKER INSERTION       Home Meds: Prior to Admission medications   Medication Sig Start Date End Date Taking? Authorizing Provider  carvedilol (COREG) 3.125 MG tablet Take 1 tablet (3.125 mg total) by mouth 2 (two) times daily with a meal. 04/02/16   Minna Merritts, MD  insulin aspart (NOVOLOG) 100 UNIT/ML injection Inject 5 Units into the skin 3 (three) times daily with meals.     Historical Provider, MD  LEVEMIR FLEXTOUCH 100 UNIT/ML Pen Inject 18  Units into the skin every evening.    Historical Provider, MD  potassium chloride SA (K-DUR,KLOR-CON) 20 MEQ tablet Take 1 tablet (20 mEq total) by mouth daily. 03/15/16   Alisa Graff, FNP  sacubitril-valsartan (ENTRESTO) 24-26 MG Take 1 tablet by mouth 2 (two) times daily. 10/22/16   Wellington Hampshire, MD  spironolactone (ALDACTONE) 25 MG tablet Take 1 tablet (25 mg total) by mouth daily. 04/02/16   Minna Merritts, MD  torsemide (DEMADEX) 20 MG tablet Take 1 tablet (20 mg total) by mouth daily. 05/14/16 10/22/16  Wellington Hampshire, MD    Allergies:  Allergies  Allergen Reactions  . Bee Venom Anaphylaxis    Social History   Social History  . Marital status: Widowed    Spouse name: N/A  . Number of children: N/A  . Years of education: N/A   Occupational History  . Not on file.   Social History Main Topics  . Smoking status: Never Smoker  . Smokeless tobacco: Never Used  . Alcohol use No  . Drug use: No  . Sexual activity: Not on file   Other Topics Concern  . Not on file   Social History Narrative  . No narrative on file     Family History  Problem Relation Age of Onset  . Diabetes Mellitus I Grandchild   . Stroke Father   . Diabetes Brother  ROS:  Please see the history of present illness.     All other systems reviewed and negative.    Physical Exam:  Blood pressure 124/70, pulse 76, height 5\' 2"  (1.575 m), weight 137 lb (62.1 kg). General: Well developed, well nourished female in no acute distress. Head: Normocephalic, atraumatic, sclera non-icteric, no xanthomas, nares are without discharge. EENT: normal  Lymph Nodes:  none Neck: Negative for carotid bruits. JVD not elevated. Back:without scoliosis kyphosis Device pocket well healed; without hematoma or erythema.  There is no tethering  Lungs: Clear bilaterally to auscultation without wheezes, rales, or rhonchi. Breathing is unlabored. Heart: RRR with S1 S2. No  murmur . No rubs, or gallops  appreciated. Abdomen: Soft, non-tender, non-distended with normoactive bowel sounds. No hepatomegaly. No rebound/guarding. No obvious abdominal masses. Msk:  Strength and tone appear normal for age. Extremities: No clubbing or cyanosis. No  edema.  Distal pedal pulses are 2+ and equal bilaterally. Skin: Warm and Dry Neuro: Alert and oriented X 3. CN III-XII intact Grossly normal sensory and motor function . Psych:  Responds to questions appropriately with a normal affect.      Labs: Cardiac Enzymes No results for input(s): CKTOTAL, CKMB, TROPONINI in the last 72 hours. CBC Lab Results  Component Value Date   WBC 8.7 12/27/2016   HGB 12.9 12/27/2016   HCT 38.4 12/27/2016   MCV 83.2 12/27/2016   PLT 191 12/27/2016   PROTIME: No results for input(s): LABPROT, INR in the last 72 hours. Chemistry No results for input(s): NA, K, CL, CO2, BUN, CREATININE, CALCIUM, PROT, BILITOT, ALKPHOS, ALT, AST, GLUCOSE in the last 168 hours.  Invalid input(s): LABALBU Lipids No results found for: CHOL, HDL, LDLCALC, TRIG BNP No results found for: PROBNP Thyroid Function Tests: No results for input(s): TSH, T4TOTAL, T3FREE, THYROIDAB in the last 72 hours.  Invalid input(s): FREET3 Miscellaneous No results found for: DDIMER  Radiology/Studies:  No results found.  EKG: Personally reviewed  P-synchronous/ AV  pacing   Assessment and Plan:  Complete heart block-device dependent  Pacemaker [employer] The patient's device was interrogated and the information was fully reviewed.  The device was reprogrammed to maximize longevity  NICM resolved  DM with elevated HgbA1c >12 With coronary artery disease  Elevated LDL  PVC  Syncope  Congestive heart failure-chronic-class II-systolic/diastolic   The patient has had no recurrent syncope.  Device was reprogrammed to maximize longevity.  She's now device dependent  She has infrequent PVCs        [name]

## 2017-02-05 NOTE — Patient Instructions (Signed)
Medication Instructions: - Your physician recommends that you continue on your current medications as directed. Please refer to the Current Medication list given to you today.  Labwork: - none ordered  Procedures/Testing: - none ordered  Follow-Up: - Remote monitoring is used to monitor your Pacemaker of ICD from home. This monitoring reduces the number of office visits required to check your device to one time per year. It allows [place] to keep an eye on the functioning of your device to ensure it is working properly. You are scheduled for a device check from home on 05/07/17. You may send your transmission at any time that day. If you have a wireless device, the transmission will be sent automatically. After your physician reviews your transmission, you will receive a postcard with your next transmission date.  - Your physician wants you to follow-up in: 1 year with Dr. Caryl Comes. You will receive a reminder letter in the mail two months in advance. If you don't receive a letter, please call our office to schedule the follow-up appointment.   Any Additional Special Instructions Will Be Listed Below (If Applicable).     If you need a refill on your cardiac medications before your next appointment, please call your pharmacy.

## 2017-02-20 ENCOUNTER — Other Ambulatory Visit: Payer: Self-pay

## 2017-02-20 ENCOUNTER — Ambulatory Visit (INDEPENDENT_AMBULATORY_CARE_PROVIDER_SITE_OTHER): Payer: BLUE CROSS/BLUE SHIELD

## 2017-02-20 DIAGNOSIS — I428 Other cardiomyopathies: Secondary | ICD-10-CM | POA: Diagnosis not present

## 2017-02-21 ENCOUNTER — Other Ambulatory Visit: Payer: Self-pay | Admitting: Cardiovascular Disease

## 2017-02-25 ENCOUNTER — Telehealth: Payer: Self-pay

## 2017-02-25 NOTE — Telephone Encounter (Signed)
lmtcb for echo results.

## 2017-04-19 ENCOUNTER — Other Ambulatory Visit: Payer: Self-pay | Admitting: Cardiovascular Disease

## 2017-05-07 ENCOUNTER — Other Ambulatory Visit: Payer: Self-pay | Admitting: Cardiovascular Disease

## 2017-05-07 ENCOUNTER — Ambulatory Visit (INDEPENDENT_AMBULATORY_CARE_PROVIDER_SITE_OTHER): Payer: BLUE CROSS/BLUE SHIELD | Admitting: *Deleted

## 2017-05-07 DIAGNOSIS — I442 Atrioventricular block, complete: Secondary | ICD-10-CM | POA: Diagnosis not present

## 2017-05-07 NOTE — Progress Notes (Signed)
Remote pacemaker transmission.   

## 2017-05-09 ENCOUNTER — Encounter: Payer: Self-pay | Admitting: Cardiology

## 2017-05-10 LAB — CUP PACEART REMOTE DEVICE CHECK
Battery Remaining Percentage: 100 %
Date Time Interrogation Session: 20180925063200
Implantable Lead Implant Date: 20160810
Implantable Lead Location: 753860
Implantable Lead Model: 7741
Implantable Lead Serial Number: 682646
Implantable Pulse Generator Implant Date: 20160810
Lead Channel Impedance Value: 630 Ohm
Lead Channel Impedance Value: 854 Ohm
Lead Channel Setting Pacing Amplitude: 2 V
MDC IDC LEAD IMPLANT DT: 20160810
MDC IDC LEAD LOCATION: 753859
MDC IDC LEAD SERIAL: 640659
MDC IDC MSMT BATTERY REMAINING LONGEVITY: 132 mo
MDC IDC SET LEADCHNL RV PACING AMPLITUDE: 2.4 V
MDC IDC SET LEADCHNL RV PACING PULSEWIDTH: 0.5 ms
MDC IDC SET LEADCHNL RV SENSING SENSITIVITY: 2.5 mV
MDC IDC STAT BRADY RA PERCENT PACED: 0 %
MDC IDC STAT BRADY RV PERCENT PACED: 100 %
Pulse Gen Serial Number: 722311

## 2017-06-03 ENCOUNTER — Other Ambulatory Visit: Payer: Self-pay | Admitting: Family

## 2017-06-03 ENCOUNTER — Encounter: Payer: Self-pay | Admitting: Internal Medicine

## 2017-06-04 ENCOUNTER — Other Ambulatory Visit: Payer: Self-pay | Admitting: Cardiovascular Disease

## 2017-07-02 ENCOUNTER — Other Ambulatory Visit: Payer: Self-pay | Admitting: *Deleted

## 2017-07-02 MED ORDER — POTASSIUM CHLORIDE CRYS ER 20 MEQ PO TBCR: EXTENDED_RELEASE_TABLET | ORAL | 3 refills | Status: DC

## 2017-07-31 ENCOUNTER — Other Ambulatory Visit: Payer: Self-pay | Admitting: Cardiovascular Disease

## 2017-07-31 ENCOUNTER — Other Ambulatory Visit: Payer: Self-pay | Admitting: Family

## 2017-08-08 ENCOUNTER — Ambulatory Visit (INDEPENDENT_AMBULATORY_CARE_PROVIDER_SITE_OTHER): Payer: BLUE CROSS/BLUE SHIELD | Admitting: *Deleted

## 2017-08-08 DIAGNOSIS — I442 Atrioventricular block, complete: Secondary | ICD-10-CM

## 2017-08-08 NOTE — Progress Notes (Signed)
Remote pacemaker transmission.   

## 2017-08-09 ENCOUNTER — Encounter: Payer: Self-pay | Admitting: Cardiology

## 2017-08-23 LAB — CUP PACEART REMOTE DEVICE CHECK
Battery Remaining Longevity: 126 mo
Battery Remaining Percentage: 100 %
Implantable Lead Implant Date: 20160810
Implantable Lead Location: 753860
Implantable Lead Model: 7740
Implantable Lead Model: 7741
Implantable Lead Serial Number: 682646
Implantable Pulse Generator Implant Date: 20160810
Lead Channel Impedance Value: 604 Ohm
Lead Channel Impedance Value: 777 Ohm
Lead Channel Setting Pacing Amplitude: 2.4 V
MDC IDC LEAD IMPLANT DT: 20160810
MDC IDC LEAD LOCATION: 753859
MDC IDC LEAD SERIAL: 640659
MDC IDC PG SERIAL: 722311
MDC IDC SESS DTM: 20181227073000
MDC IDC SET LEADCHNL RA PACING AMPLITUDE: 2 V
MDC IDC SET LEADCHNL RV PACING PULSEWIDTH: 0.5 ms
MDC IDC SET LEADCHNL RV SENSING SENSITIVITY: 2.5 mV
MDC IDC STAT BRADY RA PERCENT PACED: 0 %
MDC IDC STAT BRADY RV PERCENT PACED: 100 %

## 2017-08-26 ENCOUNTER — Other Ambulatory Visit: Payer: Self-pay | Admitting: Cardiovascular Disease

## 2017-09-05 ENCOUNTER — Other Ambulatory Visit: Payer: Self-pay | Admitting: Internal Medicine

## 2017-09-05 DIAGNOSIS — Z1239 Encounter for other screening for malignant neoplasm of breast: Secondary | ICD-10-CM

## 2017-10-21 ENCOUNTER — Telehealth: Payer: Self-pay | Admitting: Cardiovascular Disease

## 2017-10-21 NOTE — Telephone Encounter (Signed)
Lmov for patient to call back need to change the time of appointment  Change in schedule

## 2017-10-25 ENCOUNTER — Ambulatory Visit: Payer: BLUE CROSS/BLUE SHIELD | Admitting: Cardiovascular Disease

## 2017-10-27 ENCOUNTER — Other Ambulatory Visit: Payer: Self-pay | Admitting: Cardiovascular Disease

## 2017-11-07 ENCOUNTER — Ambulatory Visit (INDEPENDENT_AMBULATORY_CARE_PROVIDER_SITE_OTHER): Payer: BLUE CROSS/BLUE SHIELD | Admitting: *Deleted

## 2017-11-07 DIAGNOSIS — I442 Atrioventricular block, complete: Secondary | ICD-10-CM | POA: Diagnosis not present

## 2017-11-08 NOTE — Progress Notes (Signed)
Remote pacemaker transmission.   

## 2017-11-11 ENCOUNTER — Encounter: Payer: Self-pay | Admitting: Cardiology

## 2017-11-21 ENCOUNTER — Other Ambulatory Visit: Payer: Self-pay | Admitting: *Deleted

## 2017-11-21 ENCOUNTER — Telehealth: Payer: Self-pay | Admitting: Internal Medicine

## 2017-11-21 ENCOUNTER — Telehealth: Payer: Self-pay | Admitting: Cardiovascular Disease

## 2017-11-21 MED ORDER — ATORVASTATIN CALCIUM 20 MG PO TABS: 20.0000 mg | ORAL_TABLET | Freq: Every day | ORAL | 3 refills | Status: DC

## 2017-11-21 MED ORDER — TORSEMIDE 20 MG PO TABS: ORAL_TABLET | ORAL | 3 refills | Status: DC

## 2017-11-21 NOTE — Telephone Encounter (Signed)
Torsemide 20 mg and Atorvastatin 20 mg tablet sent to [facility] [address]

## 2017-11-21 NOTE — Telephone Encounter (Signed)
Can you try to reach out with patient to see if you can schedule an appointment with Dr. Fletcher Anon. Patient was to Return in about 6 months around 04/24/2017 but never did. She did see Dr. Caryl Comes 01/2017 as a new patient. I can send in a 30 day supply for all medications until patient is seen.

## 2017-11-21 NOTE — Telephone Encounter (Signed)
°*  STAT* If patient is at the pharmacy, call can be transferred to refill team.   1. Which medications need to be refilled? (please list name of each medication and dose if known)      Entresto 24-26 mg po BID   Spironolactone 25 mg po q day   2. Which pharmacy/location (including street and city if local pharmacy) is medication to be sent to?      [facility] [address] [place]   3. Do they need a 30 day or 90 day supply? [place]

## 2017-11-21 NOTE — Telephone Encounter (Signed)
°*  STAT* If patient is at the pharmacy, call can be transferred to refill team.   1. Which medications need to be refilled? (please list name of each medication and dose if known)  torsemide (DEMADEX) 20 MG  Atorvastatin (LIPITOR) 20 MG   2. Which pharmacy/location (including street and city if local pharmacy) is medication to be sent to[facility] [address] [facility] [place]  3. Do they need a 30 day or 90 day supply? 30 day

## 2017-11-21 NOTE — Telephone Encounter (Signed)
Requested Prescriptions   Signed Prescriptions Disp Refills  . atorvastatin (LIPITOR) 20 MG tablet 30 tablet 3    Sig: Take 1 tablet (20 mg total) by mouth daily.    Authorizing Provider: Deboraha Sprang    Ordering User: Othelia Pulling C  . torsemide (DEMADEX) 20 MG tablet 30 tablet 3    Sig: TAKE 1 TABLET(20 MG) BY MOUTH DAILY    Authorizing Provider: Deboraha Sprang    Ordering User: Britt Bottom

## 2017-11-22 NOTE — Telephone Encounter (Signed)
Scheduled

## 2017-11-26 LAB — CUP PACEART REMOTE DEVICE CHECK
Battery Remaining Longevity: 126 mo
Battery Remaining Percentage: 100 %
Implantable Lead Location: 753859
Implantable Lead Location: 753860
Implantable Lead Model: 7740
Implantable Lead Model: 7741
Implantable Lead Serial Number: 682646
Implantable Pulse Generator Implant Date: 20160810
Lead Channel Impedance Value: 602 Ohm
Lead Channel Impedance Value: 701 Ohm
Lead Channel Setting Pacing Amplitude: 2 V
Lead Channel Setting Pacing Amplitude: 2.4 V
MDC IDC LEAD IMPLANT DT: 20160810
MDC IDC LEAD IMPLANT DT: 20160810
MDC IDC LEAD SERIAL: 640659
MDC IDC PG SERIAL: 722311
MDC IDC SESS DTM: 20190328063100
MDC IDC SET LEADCHNL RV PACING PULSEWIDTH: 0.5 ms
MDC IDC SET LEADCHNL RV SENSING SENSITIVITY: 2.5 mV
MDC IDC STAT BRADY RA PERCENT PACED: 0 %
MDC IDC STAT BRADY RV PERCENT PACED: 100 %

## 2017-12-05 ENCOUNTER — Other Ambulatory Visit: Payer: Self-pay | Admitting: Cardiovascular Disease

## 2017-12-19 ENCOUNTER — Ambulatory Visit (INDEPENDENT_AMBULATORY_CARE_PROVIDER_SITE_OTHER): Payer: Medicare Other | Admitting: Cardiovascular Disease

## 2017-12-19 ENCOUNTER — Encounter: Payer: Self-pay | Admitting: Cardiovascular Disease

## 2017-12-19 VITALS — BP 130/64 | HR 78 | Ht 62.0 in | Wt 147.8 lb

## 2017-12-19 DIAGNOSIS — I5022 Chronic systolic (congestive) heart failure: Secondary | ICD-10-CM

## 2017-12-19 DIAGNOSIS — I10 Essential (primary) hypertension: Secondary | ICD-10-CM | POA: Diagnosis not present

## 2017-12-19 MED ORDER — SPIRONOLACTONE 25 MG PO TABS: ORAL_TABLET | ORAL | 1 refills | Status: DC

## 2017-12-19 MED ORDER — SACUBITRIL-VALSARTAN 24-26 MG PO TABS: 1.0000 | ORAL_TABLET | Freq: Two times a day (BID) | ORAL | Status: DC

## 2017-12-19 MED ORDER — CARVEDILOL 3.125 MG PO TABS: 3.1250 mg | ORAL_TABLET | Freq: Two times a day (BID) | ORAL | 1 refills | Status: DC

## 2017-12-19 MED ORDER — ATORVASTATIN CALCIUM 20 MG PO TABS: 20.0000 mg | ORAL_TABLET | Freq: Every day | ORAL | 1 refills | Status: DC

## 2017-12-19 NOTE — Patient Instructions (Addendum)
Medication Instructions: CHANGE how you to take the Carvedilol 3.125 mg to twice daily CHANGE how you take the Entresto to twice daily   If you need a refill on your cardiac medications before your next appointment, please call your pharmacy.    Follow-Up: Your physician wants you to follow-up in 6 months with Dr. Fletcher Anon. You will receive a reminder letter in the mail two months in advance. If you don't receive a letter, please call our office at [phone] to schedule this follow-up appointment.   Thank you for choosing [facility]!

## 2017-12-19 NOTE — Progress Notes (Signed)
Cardiology Office Note   Date:  12/19/2017   ID:  Tika Hannis, DOB Sep 17, 1950, MRN [id number]  PCP:  Glendon Axe, MD  Cardiologist:   Kathlyn Sacramento, MD   Chief Complaint  Patient presents with  . other    Patient c/o left leg craps at night. Patient states other than that she is doing good. meds reviewed verbally with patient.       History of Present Illness: Bethany Gillespie is a 67 y.o. female who presents for a follow-up visit regarding chronic systolic heart failure due to nonischemic cardiomyopathy. This was diagnosed in July 2017 with an ejection fraction of 25-30%. Cardiac catheterization showed small vessel coronary artery disease with no obstructive disease affecting the main arteries. She had previous dual-chamber pacemaker placement in August 2016 in [place]. She has other chronic medical conditions that include diabetes mellitus and essential hypertension. She had a gradual improvement in ejection fraction on medical therapy with most recent echo in July showing an EF of 50 to 55%. She has been doing well with no recent chest pain, shortness of breath or palpitations. .   Past Medical History:  Diagnosis Date  . 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 s/p Engineer, agricultural PPM in 03/2015  . Bell's palsy   . Bradycardia   . CAD (coronary artery disease)    a. 02/2016 Cath: LM nl, LAD 30p, D1 small w/ severe ostial/mid dzs, LCX nl, RCA nl, EF <25%, glob HK.  . Chronic combined systolic and diastolic CHF (congestive heart failure) ([place])    a. Echo in 03/2015: EF 55-60%, Grade 1 DD;  b. 02/2016 Echo: 25-30%, mod LVH, Gr1 DD, mild MR, mildly dil LA, prominent apical trabeculations.  . Diabetes ([place])   . Hernia of abdominal wall   . HTN (hypertension)   . Hypertension   . NICM (nonischemic cardiomyopathy) ([place])    a. 02/2016 relatively non-obstructive cath, EF 25-30% by echo.    Past Surgical History:  Procedure Laterality Date  . CARDIAC CATHETERIZATION  Bilateral 03/01/2016   Procedure: Left Heart Cath and Coronary Angiography;  Surgeon: Minna Merritts, MD;  Location: [facility];  Service: Cardiovascular;  Laterality: Bilateral;  . HERNIA REPAIR    . PACEMAKER INSERTION    . PACEMAKER INSERTION       Current Outpatient Medications  Medication Sig Dispense Refill  . atorvastatin (LIPITOR) 20 MG tablet Take 1 tablet (20 mg total) by mouth daily. 30 tablet 3  . carvedilol (COREG) 3.125 MG tablet Take 3.125 mg by mouth daily.    [name] HUMALOG KWIKPEN 100 UNIT/ML KiwkPen INJ UP TO 50 UNITS [place] D IN DIVIDED DOSES AS PRESCRIBED  9  . LEVEMIR FLEXTOUCH 100 UNIT/ML Pen Inject 18 Units into the skin every evening.    . potassium chloride SA (K-DUR,KLOR-CON) 20 MEQ tablet TAKE 1 TABLET(20 MEQ) BY MOUTH DAILY 30 tablet 3  . sacubitril-valsartan (ENTRESTO) 24-26 MG Take 1 tablet by mouth daily.    [name] spironolactone (ALDACTONE) 25 MG tablet TAKE 1 TABLET(25 MG) BY MOUTH DAILY 30 tablet 0  . torsemide (DEMADEX) 20 MG tablet TAKE 1 TABLET(20 MG) BY MOUTH DAILY 30 tablet 3   No current facility-administered medications for this visit.     Allergies:   Bee venom    Social History:  The patient  reports that she has never smoked. She has never used smokeless tobacco. She reports that she does not drink alcohol or use drugs.  Family History:  The patient's family history includes Diabetes in her brother; Diabetes Mellitus I in her grandchild; Stroke in her father.    ROS:  Please see the history of present illness.   Otherwise, review of systems are positive for none.   All other systems are reviewed and negative.    PHYSICAL EXAM: VS:  BP 130/64 (BP Location: Left Arm, Patient Position: Sitting, Cuff Size: Normal)   Pulse 78   Ht 5\' 2"  (1.575 m)   Wt 147 lb 12 oz (67 kg)   BMI 27.02 kg/m  , BMI Body mass index is 27.02 kg/m. GEN: Well nourished, well developed, in no acute distress  HEENT: normal  Neck: no JVD, carotid bruits, or  masses Cardiac: RRR; no murmurs, rubs, or gallops,no edema  Respiratory:  clear to auscultation bilaterally, normal work of breathing GI: soft, nontender, nondistended, + BS MS: no deformity or atrophy  Skin: warm and dry, no rash Neuro:  Strength and sensation are intact Psych: euthymic mood, full affect   EKG:  EKG is ordered today. EKG showed atrial sensed ventricular paced rhythm.   Recent Labs: 12/27/2016: BUN 26; Creatinine, Ser 1.04; Hemoglobin 12.9; Platelets 191; Potassium 4.2; Sodium 131    Lipid Panel No results found for: CHOL, TRIG, HDL, CHOLHDL, VLDL, LDLCALC, LDLDIRECT    Wt Readings from Last 3 Encounters:  12/19/17 147 lb 12 oz (67 kg)  02/05/17 137 lb (62.1 kg)  12/27/16 130 lb (59 kg)       No flowsheet data found.    ASSESSMENT AND PLAN:  1.  Chronic systolic heart failure: Most recent ejection fraction was 50-55%. She is currently New York Heart Association class II.  She appears to be euvolemic on small dose torsemide. I again instructed her to take Entresto and carvedilol twice daily instead of once daily.  She was doing that before because Delene Loll was not covered by insurance and she was trying to save money but the medication is now covered.  I reviewed her labs in January which showed normal kidney function and a potassium of 4.1. She is currently on optimal medical therapy for heart failure.  2. Essential hypertension: Blood pressure is controlled on current medications.  3. History of second-degree AV block status post dual-chamber pacemaker placement: This is followed by our device clinic.  4. Diabetes mellitus, managed by primary care physician.   Disposition:   FU with me in 6 months  Signed,  Kathlyn Sacramento, MD  12/19/2017 11:27 AM    [place]

## 2018-02-01 ENCOUNTER — Other Ambulatory Visit: Payer: Self-pay | Admitting: Cardiovascular Disease

## 2018-02-06 ENCOUNTER — Ambulatory Visit (INDEPENDENT_AMBULATORY_CARE_PROVIDER_SITE_OTHER): Payer: Medicare Other | Admitting: *Deleted

## 2018-02-06 ENCOUNTER — Encounter: Payer: Self-pay | Admitting: Cardiology

## 2018-02-06 DIAGNOSIS — I442 Atrioventricular block, complete: Secondary | ICD-10-CM | POA: Diagnosis not present

## 2018-02-06 NOTE — Progress Notes (Signed)
Remote pacemaker transmission.   

## 2018-02-07 LAB — CUP PACEART REMOTE DEVICE CHECK
Battery Remaining Longevity: 126 mo
Battery Remaining Percentage: 100 %
Brady Statistic RA Percent Paced: 1 %
Brady Statistic RV Percent Paced: 100 %
Implantable Lead Implant Date: 20160810
Implantable Lead Location: 753860
Implantable Lead Model: 7740
Implantable Lead Model: 7741
Implantable Lead Serial Number: 640659
Lead Channel Setting Pacing Amplitude: 2 V
Lead Channel Setting Pacing Amplitude: 2.4 V
Lead Channel Setting Sensing Sensitivity: 2.5 mV
MDC IDC LEAD IMPLANT DT: 20160810
MDC IDC LEAD LOCATION: 753859
MDC IDC LEAD SERIAL: 682646
MDC IDC MSMT LEADCHNL RA IMPEDANCE VALUE: 788 Ohm
MDC IDC MSMT LEADCHNL RV IMPEDANCE VALUE: 606 Ohm
MDC IDC PG IMPLANT DT: 20160810
MDC IDC PG SERIAL: 722311
MDC IDC SESS DTM: 20190627063100
MDC IDC SET LEADCHNL RV PACING PULSEWIDTH: 0.5 ms

## 2018-02-18 DIAGNOSIS — E1159 Type 2 diabetes mellitus with other circulatory complications: Secondary | ICD-10-CM | POA: Diagnosis not present

## 2018-02-18 DIAGNOSIS — N183 Chronic kidney disease, stage 3 (moderate): Secondary | ICD-10-CM | POA: Diagnosis not present

## 2018-02-18 DIAGNOSIS — E1165 Type 2 diabetes mellitus with hyperglycemia: Secondary | ICD-10-CM | POA: Diagnosis not present

## 2018-02-24 DIAGNOSIS — E1122 Type 2 diabetes mellitus with diabetic chronic kidney disease: Secondary | ICD-10-CM | POA: Diagnosis not present

## 2018-02-24 DIAGNOSIS — N183 Chronic kidney disease, stage 3 (moderate): Secondary | ICD-10-CM | POA: Diagnosis not present

## 2018-02-24 DIAGNOSIS — E1165 Type 2 diabetes mellitus with hyperglycemia: Secondary | ICD-10-CM | POA: Diagnosis not present

## 2018-02-24 DIAGNOSIS — Z794 Long term (current) use of insulin: Secondary | ICD-10-CM | POA: Diagnosis not present

## 2018-02-24 DIAGNOSIS — E1129 Type 2 diabetes mellitus with other diabetic kidney complication: Secondary | ICD-10-CM | POA: Diagnosis not present

## 2018-02-24 DIAGNOSIS — E1159 Type 2 diabetes mellitus with other circulatory complications: Secondary | ICD-10-CM | POA: Diagnosis not present

## 2018-02-24 DIAGNOSIS — E113293 Type 2 diabetes mellitus with mild nonproliferative diabetic retinopathy without macular edema, bilateral: Secondary | ICD-10-CM | POA: Diagnosis not present

## 2018-02-24 DIAGNOSIS — R809 Proteinuria, unspecified: Secondary | ICD-10-CM | POA: Diagnosis not present

## 2018-03-05 DIAGNOSIS — N183 Chronic kidney disease, stage 3 (moderate): Secondary | ICD-10-CM | POA: Diagnosis not present

## 2018-03-06 ENCOUNTER — Other Ambulatory Visit: Payer: Self-pay | Admitting: Internal Medicine

## 2018-03-06 DIAGNOSIS — N183 Chronic kidney disease, stage 3 unspecified: Secondary | ICD-10-CM

## 2018-03-11 DIAGNOSIS — E1165 Type 2 diabetes mellitus with hyperglycemia: Secondary | ICD-10-CM | POA: Diagnosis not present

## 2018-03-18 ENCOUNTER — Ambulatory Visit
Admission: RE | Admit: 2018-03-18 | Discharge: 2018-03-18 | Disposition: A | Discharge status: Home / Self Care | Payer: Medicare HMO | Source: Ambulatory Visit | Attending: Internal Medicine | Admitting: Internal Medicine

## 2018-03-18 DIAGNOSIS — N2889 Other specified disorders of kidney and ureter: Secondary | ICD-10-CM | POA: Diagnosis not present

## 2018-03-18 DIAGNOSIS — N183 Chronic kidney disease, stage 3 unspecified: Secondary | ICD-10-CM

## 2018-03-24 DIAGNOSIS — E113413 Type 2 diabetes mellitus with severe nonproliferative diabetic retinopathy with macular edema, bilateral: Secondary | ICD-10-CM | POA: Diagnosis not present

## 2018-03-28 ENCOUNTER — Telehealth: Payer: Self-pay | Admitting: Internal Medicine

## 2018-03-28 NOTE — Telephone Encounter (Signed)
Patient is suppose to be having an MRI Patient would like to know if it is okay to have an MRI with a pacemaker Please call to discuss

## 2018-03-28 NOTE — Telephone Encounter (Signed)
Informed patient that her device is MRI compatible. Patient verbalized understanding.

## 2018-04-03 ENCOUNTER — Other Ambulatory Visit (HOSPITAL_COMMUNITY): Payer: Self-pay | Admitting: Internal Medicine

## 2018-04-03 DIAGNOSIS — N2889 Other specified disorders of kidney and ureter: Secondary | ICD-10-CM

## 2018-04-15 ENCOUNTER — Other Ambulatory Visit (HOSPITAL_COMMUNITY): Payer: Self-pay | Admitting: Internal Medicine

## 2018-04-15 ENCOUNTER — Ambulatory Visit (HOSPITAL_COMMUNITY)
Admission: RE | Admit: 2018-04-15 | Discharge: 2018-04-15 | Disposition: A | Discharge status: Home / Self Care | Payer: Medicare HMO | Source: Ambulatory Visit | Attending: Internal Medicine | Admitting: Internal Medicine

## 2018-04-15 DIAGNOSIS — K449 Diaphragmatic hernia without obstruction or gangrene: Secondary | ICD-10-CM | POA: Diagnosis not present

## 2018-04-15 DIAGNOSIS — D1803 Hemangioma of intra-abdominal structures: Secondary | ICD-10-CM | POA: Diagnosis not present

## 2018-04-15 DIAGNOSIS — N189 Chronic kidney disease, unspecified: Secondary | ICD-10-CM | POA: Diagnosis not present

## 2018-04-15 DIAGNOSIS — N2889 Other specified disorders of kidney and ureter: Secondary | ICD-10-CM | POA: Diagnosis not present

## 2018-04-15 DIAGNOSIS — D3502 Benign neoplasm of left adrenal gland: Secondary | ICD-10-CM | POA: Insufficient documentation

## 2018-04-15 LAB — CREATININE, SERUM
Creatinine, Ser: 1.9 mg/dL — ABNORMAL HIGH (ref 0.44–1.00)
GFR, EST AFRICAN AMERICAN: 31 mL/min — AB (ref >60)
GFR, EST NON AFRICAN AMERICAN: 27 mL/min — AB (ref >60)

## 2018-04-18 ENCOUNTER — Ambulatory Visit: Payer: Self-pay | Admitting: Urology

## 2018-04-21 ENCOUNTER — Other Ambulatory Visit: Payer: Self-pay

## 2018-04-21 ENCOUNTER — Telehealth: Payer: Self-pay | Admitting: Cardiovascular Disease

## 2018-04-21 MED ORDER — CARVEDILOL 3.125 MG PO TABS: 3.1250 mg | ORAL_TABLET | Freq: Two times a day (BID) | ORAL | 1 refills | Status: DC

## 2018-04-21 MED ORDER — ATORVASTATIN CALCIUM 20 MG PO TABS: 20.0000 mg | ORAL_TABLET | Freq: Every day | ORAL | 1 refills | Status: DC

## 2018-04-21 MED ORDER — SPIRONOLACTONE 25 MG PO TABS: ORAL_TABLET | ORAL | 1 refills | Status: DC

## 2018-04-21 NOTE — Telephone Encounter (Signed)
°*  STAT* If patient is at the pharmacy, call can be transferred to refill team.   1. Which medications need to be refilled? (please list name of each medication and dose if known)  Atorvastatin (LIPITOR) 20 MG 1 tablet daily Carvedilol (COREG) 3.125 MG 1 tablet 2 times daily Spironolactone (ALDACTONE) 1 tablet daily  2. Which pharmacy/location (including street and city if local pharmacy) is medication to be sent to? [facility] on [address].  3. Do they need a 30 day or 90 day supply? 90 day

## 2018-04-22 ENCOUNTER — Other Ambulatory Visit: Payer: Self-pay | Admitting: *Deleted

## 2018-04-22 DIAGNOSIS — E1165 Type 2 diabetes mellitus with hyperglycemia: Secondary | ICD-10-CM | POA: Diagnosis not present

## 2018-04-22 MED ORDER — CARVEDILOL 3.125 MG PO TABS: 3.1250 mg | ORAL_TABLET | Freq: Two times a day (BID) | ORAL | 1 refills | Status: DC

## 2018-04-22 MED ORDER — SPIRONOLACTONE 25 MG PO TABS: ORAL_TABLET | ORAL | 1 refills | Status: DC

## 2018-04-22 MED ORDER — ATORVASTATIN CALCIUM 20 MG PO TABS: 20.0000 mg | ORAL_TABLET | Freq: Every day | ORAL | 1 refills | Status: DC

## 2018-04-22 NOTE — Telephone Encounter (Signed)
Patient calling to say these were not received by [facility] per patient request

## 2018-04-22 NOTE — Telephone Encounter (Signed)
Rx sent to [facility] on [address] Day supply.

## 2018-04-23 ENCOUNTER — Ambulatory Visit (INDEPENDENT_AMBULATORY_CARE_PROVIDER_SITE_OTHER): Payer: Medicare HMO | Admitting: Urology

## 2018-04-23 ENCOUNTER — Other Ambulatory Visit: Payer: Self-pay

## 2018-04-23 ENCOUNTER — Encounter: Payer: Self-pay | Admitting: Urology

## 2018-04-23 VITALS — BP 136/73 | HR 80 | Ht 62.0 in | Wt 143.1 lb

## 2018-04-23 DIAGNOSIS — N281 Cyst of kidney, acquired: Secondary | ICD-10-CM

## 2018-04-23 DIAGNOSIS — E279 Disorder of adrenal gland, unspecified: Secondary | ICD-10-CM | POA: Diagnosis not present

## 2018-04-23 DIAGNOSIS — I509 Heart failure, unspecified: Secondary | ICD-10-CM | POA: Diagnosis not present

## 2018-04-23 DIAGNOSIS — E278 Other specified disorders of adrenal gland: Secondary | ICD-10-CM | POA: Insufficient documentation

## 2018-04-23 DIAGNOSIS — I110 Hypertensive heart disease with heart failure: Secondary | ICD-10-CM | POA: Diagnosis not present

## 2018-04-23 NOTE — Progress Notes (Signed)
04/23/2018 9:26 AM   Bethany Gillespie [birth year] [id number]  Referring provider: Glendon Axe, MD [facility] [place], [place] [address]  CC: Possible right renal mass, left adrenal nodule  HPI: I the pleasure of seeing Bethany Gillespie in urology clinic today in consultation for possible right renal mass from Dr. Candiss Norse.  She is a 67 year old comorbid female with history of CAD, CHF, poorly controlled diabetes, and hypertension.  Renal ultrasound was performed for elevated creatinine of 1.9, and showed a possible right sided 1.3 cm solid lower pole mass.  A follow-up MRI without contrast was ordered, and did not show any solid lesions, however there were bilateral complex cysts.  Additionally, there is a 1.6 cm left adrenal nodule consistent with an adenoma.  From chart review, it does not appear that this has been worked up functionally.  She is asymptomatic.  She denies flank pain or gross hematuria.  There is no family history of kidney cancer.  There are no aggravating or alleviating factors.  Duration is since August 2019.   PMH: Past Medical History:  Diagnosis Date  . 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 s/p Engineer, agricultural PPM in 03/2015  . Bell's palsy   . Bradycardia   . CAD (coronary artery disease)    a. 02/2016 Cath: LM nl, LAD 30p, D1 small w/ severe ostial/mid dzs, LCX nl, RCA nl, EF <25%, glob HK.  . Chronic combined systolic and diastolic CHF (congestive heart failure) ([place])    a. Echo in 03/2015: EF 55-60%, Grade 1 DD;  b. 02/2016 Echo: 25-30%, mod LVH, Gr1 DD, mild MR, mildly dil LA, prominent apical trabeculations.  . Diabetes ([place])   . Hernia of abdominal wall   . HTN (hypertension)   . Hypertension   . NICM (nonischemic cardiomyopathy) ([place])    a. 02/2016 relatively non-obstructive cath, EF 25-30% by echo.    Surgical History: Past Surgical History:  Procedure Laterality Date  . CARDIAC CATHETERIZATION Bilateral 03/01/2016   Procedure: Left Heart Cath and Coronary Angiography;  Surgeon: Minna Merritts, MD;  Location: [facility];  Service: Cardiovascular;  Laterality: Bilateral;  . HERNIA REPAIR    . PACEMAKER INSERTION    . PACEMAKER INSERTION      Allergies:  Allergies  Allergen Reactions  . Bee Venom Anaphylaxis    Family History: Family History  Problem Relation Age of Onset  . Diabetes Mellitus I Grandchild   . Stroke Father   . Diabetes Brother   . Skin cancer Mother     Social History:  reports that she has never smoked. She has never used smokeless tobacco. She reports that she does not drink alcohol or use drugs.  ROS: Please see flowsheet from today's date for complete review of systems.  Physical Exam: BP 136/73   Pulse 80   Ht 5\' 2"  (1.575 m)   Wt 143 lb 1.6 oz (64.9 kg)   BMI 26.17 kg/m    Constitutional:  Alert and oriented, No acute distress. Cardiovascular: No clubbing, cyanosis, or edema. Respiratory: Normal respiratory effort, no increased work of breathing. GI: Abdomen is soft, nontender, nondistended, no abdominal masses GU: No CVA tenderness Lymph: No cervical or inguinal lymphadenopathy. Skin: No rashes, bruises or suspicious lesions. Neurologic: Grossly intact, no focal deficits, moving all 4 extremities. Psychiatric: Normal mood and affect.  Pertinent Imaging: I have personally reviewed the renal ultrasound and MR abdomen without contrast.  Findings per HPI.  Assessment & Plan:   In summary, [name] is a comorbid 67 year old female incidentally found to have bilateral small complex renal cysts, as well as a 1.6 cm left adrenal adenoma.  She has poorly controlled diabetes, as well as hypertension.  1. Renal cysts -MRI w and w/o contrast in 6 months to better evaluate  2. Adrenal adenoma -Needs functional workup, especially in the setting of poorly controlled DM and HTN  Return in about 6 months (around 10/22/2018) for MRI.  Billey Co,  [facility] [address], [place], [place] [address] [phone]

## 2018-05-08 ENCOUNTER — Ambulatory Visit (INDEPENDENT_AMBULATORY_CARE_PROVIDER_SITE_OTHER): Payer: Medicare HMO | Admitting: *Deleted

## 2018-05-08 DIAGNOSIS — I442 Atrioventricular block, complete: Secondary | ICD-10-CM

## 2018-05-08 NOTE — Progress Notes (Signed)
Remote pacemaker transmission.   

## 2018-05-09 ENCOUNTER — Encounter: Payer: Self-pay | Admitting: Cardiology

## 2018-05-22 DIAGNOSIS — E1165 Type 2 diabetes mellitus with hyperglycemia: Secondary | ICD-10-CM | POA: Diagnosis not present

## 2018-05-23 ENCOUNTER — Other Ambulatory Visit: Payer: Self-pay | Admitting: Cardiovascular Disease

## 2018-05-23 MED ORDER — SPIRONOLACTONE 25 MG PO TABS: ORAL_TABLET | ORAL | 0 refills | Status: DC

## 2018-05-23 MED ORDER — TORSEMIDE 20 MG PO TABS: ORAL_TABLET | ORAL | 3 refills | Status: DC

## 2018-05-23 MED ORDER — CARVEDILOL 3.125 MG PO TABS: 3.1250 mg | ORAL_TABLET | Freq: Two times a day (BID) | ORAL | 0 refills | Status: DC

## 2018-05-23 NOTE — Telephone Encounter (Signed)
°*  STAT* If patient is at the pharmacy, call can be transferred to refill team.   1. Which medications need to be refilled? (please list name of each medication and dose if known)  Entresto 24-26 MG - 1 tablet twice daily Torsemide (DEMADEX) 20 MG - 1 tablet daily Carvedilol (COREG) 3.125 MG - 1 tablet 2 times daily Spironolactone (ALDACTONE) 25 MG - 1 tablet daily  2. Which pharmacy/location (including street and city if local pharmacy) is medication to be sent to? [facility] on [address]  3. Do they need a 30 day or 90 day supply? 90 day

## 2018-05-26 ENCOUNTER — Other Ambulatory Visit: Payer: Self-pay | Admitting: Cardiovascular Disease

## 2018-05-26 MED ORDER — SACUBITRIL-VALSARTAN 24-26 MG PO TABS: 1.0000 | ORAL_TABLET | Freq: Two times a day (BID) | ORAL | 3 refills | Status: DC

## 2018-05-26 MED ORDER — TORSEMIDE 20 MG PO TABS: ORAL_TABLET | ORAL | 3 refills | Status: DC

## 2018-05-26 NOTE — Telephone Encounter (Signed)
°*  STAT* If patient is at the pharmacy, call can be transferred to refill team.   1. Which medications need to be refilled? (please list name of each medication and dose if known)     Entresto 24-26 mg po BID    Torsemide 20 mg po q day   2. Which pharmacy/location (including street and city if local pharmacy) is medication to be sent to?  [place]   3. Do they need a 30 day or 90 day supply? [place]

## 2018-05-28 ENCOUNTER — Telehealth: Payer: Self-pay | Admitting: Cardiovascular Disease

## 2018-05-28 NOTE — Telephone Encounter (Signed)
Pt c/o medication issue:  1. Name of Medication: Entresto   2. How are you currently taking this medication (dosage and times per day)? 24-26 mg po BID  3. Are you having a reaction (difficulty breathing--STAT)? no  4. What is your medication issue? Per Patient Prior Auth Required

## 2018-05-29 NOTE — Telephone Encounter (Signed)
Prior authorization for Entresto 24/26 mg - 1 tablet BID was submitted to [facility] via CoverMyMeds.  Key: [id number]  Waiting on reply- message: "If [employer] has not replied to your request within 24-72 hours please contact [employer] at [phone]."

## 2018-05-30 DIAGNOSIS — E113293 Type 2 diabetes mellitus with mild nonproliferative diabetic retinopathy without macular edema, bilateral: Secondary | ICD-10-CM | POA: Diagnosis not present

## 2018-05-30 DIAGNOSIS — Z794 Long term (current) use of insulin: Secondary | ICD-10-CM | POA: Diagnosis not present

## 2018-05-30 DIAGNOSIS — N183 Chronic kidney disease, stage 3 (moderate): Secondary | ICD-10-CM | POA: Diagnosis not present

## 2018-05-30 NOTE — Telephone Encounter (Signed)
Left a message for the patient to call back to inform the patient that her Bethany Gillespie has been approved.

## 2018-05-30 NOTE — Telephone Encounter (Signed)
Patient in office to check on status of PA and is aware now it is pending with [employer]   Patient calling the office for samples of medication:   1.  What medication and dosage are you requesting samples for? Entresto 24-26 mg po BID 2.  Are you currently out of this medication?  Yes waiting on Prior Auth

## 2018-05-30 NOTE — Telephone Encounter (Signed)
Prior authorization has been approved through 08/03/2019.

## 2018-06-02 NOTE — Telephone Encounter (Signed)
I spoke with the patient. She is aware her Bethany Gillespie was approved and states she did pick this up.

## 2018-06-06 DIAGNOSIS — N189 Chronic kidney disease, unspecified: Secondary | ICD-10-CM | POA: Diagnosis not present

## 2018-06-06 DIAGNOSIS — I509 Heart failure, unspecified: Secondary | ICD-10-CM | POA: Diagnosis not present

## 2018-06-06 DIAGNOSIS — E7800 Pure hypercholesterolemia, unspecified: Secondary | ICD-10-CM | POA: Diagnosis not present

## 2018-06-06 DIAGNOSIS — E1129 Type 2 diabetes mellitus with other diabetic kidney complication: Secondary | ICD-10-CM | POA: Diagnosis not present

## 2018-06-06 DIAGNOSIS — E1165 Type 2 diabetes mellitus with hyperglycemia: Secondary | ICD-10-CM | POA: Diagnosis not present

## 2018-06-06 DIAGNOSIS — Z794 Long term (current) use of insulin: Secondary | ICD-10-CM | POA: Diagnosis not present

## 2018-06-06 DIAGNOSIS — E113293 Type 2 diabetes mellitus with mild nonproliferative diabetic retinopathy without macular edema, bilateral: Secondary | ICD-10-CM | POA: Diagnosis not present

## 2018-06-06 DIAGNOSIS — R809 Proteinuria, unspecified: Secondary | ICD-10-CM | POA: Diagnosis not present

## 2018-06-06 DIAGNOSIS — I130 Hypertensive heart and chronic kidney disease with heart failure and stage 1 through stage 4 chronic kidney disease, or unspecified chronic kidney disease: Secondary | ICD-10-CM | POA: Diagnosis not present

## 2018-06-06 DIAGNOSIS — E1122 Type 2 diabetes mellitus with diabetic chronic kidney disease: Secondary | ICD-10-CM | POA: Diagnosis not present

## 2018-06-06 DIAGNOSIS — N289 Disorder of kidney and ureter, unspecified: Secondary | ICD-10-CM | POA: Diagnosis not present

## 2018-06-06 DIAGNOSIS — D3502 Benign neoplasm of left adrenal gland: Secondary | ICD-10-CM | POA: Diagnosis not present

## 2018-06-06 DIAGNOSIS — N184 Chronic kidney disease, stage 4 (severe): Secondary | ICD-10-CM | POA: Diagnosis not present

## 2018-06-06 DIAGNOSIS — E1159 Type 2 diabetes mellitus with other circulatory complications: Secondary | ICD-10-CM | POA: Diagnosis not present

## 2018-06-06 LAB — CUP PACEART REMOTE DEVICE CHECK
Implantable Lead Implant Date: 20160810
Implantable Lead Implant Date: 20160810
Implantable Lead Location: 753859
Implantable Lead Location: 753860
Implantable Lead Model: 7740
Implantable Lead Model: 7741
Implantable Lead Serial Number: 640659
Implantable Pulse Generator Implant Date: 20160810
MDC IDC LEAD SERIAL: 682646
MDC IDC PG SERIAL: 722311
MDC IDC SESS DTM: 20191025131545

## 2018-06-06 NOTE — Progress Notes (Signed)
Name: Bethany Gillespie  MRN/ DOB: [id number], 09-10-1950    Age/ Sex: 67 y.o., female    PCP: Glendon Axe, MD   Reason for Endocrinology Evaluation: Adrenal incidentaloma     Date of Initial Endocrinology Evaluation: 06/11/2018     HPI: [name] is a 67 y.o. female with a past medical history of HTN, T2DM, Mobitz type II AV blaock S/P pacemaker. The patient presented for initial endocrinology clinic visit on 06/11/2018 for consultative assistance with her Adrenal incidentaloma.    She was noted to have a left adrenal nodule during work up by urology for adrenal incidentaloma   Substantial weight gain? Yes- 20 lbs in the past 1 yr.  Centripetal obesity? Yes Easy bruisbility? No  Severe hypertension? No DM? Virilization? Has T2DM- since her 30's Proximal muscle weakness? no Fatigue? Only with hyperglycemia  Sudden/ severe headaches? no Weight loss? no Anxiety attacks? No Sweating? Only with hypoglycemia  Cardiac arrhythmias? Yes- S/P pacemaker in 2016. That's when she was started on spironolactone Palpitations? No Fluid retention? No Hypokalemia? Years ago    As for as diabetes, she was noted to have a hypoglycemic episode during her visit to our office, with a BG of 61 mg/dL. She was diagnosed in her 72's. Initially was on actos but stopped due to concern about bladder cancer. Was on metformin but stopped on 06/06/2018, due to worsening renal function. Insulin was started 2015.    Last A1c 9.0 % on 06/06/18  Typically A1c goes to 12 %.   Has never seen a nutritionist.   Checks glucose multiple times a day with [employer] . Has hypoglycemia on average 4x a month. Mainly during the morning hours.  She does snack during the day , in the afternoon and at bedtime  She is avoids sugar-sweetened beverages.    I have reviewed PMH, PSH, SH and FH    Medications  Levemir 30 units QAM Humalog SS     CONTINUOUS GLUCOSE MONITORING RECORD  INTERPRETATION    Dates of Recording: 10/17-10/30/19  Sensor description: [employer]  Results statistics:   CGM use % of time 47  Average and SD 152/58.4  Time in range      62  %  % Time Above 180 29  % Time Below target 9    Glycemic patterns summary: Bg's noted to increase as the day progresses  Hyperglycemic episodes  After lunch  Hypoglycemic episodes occurred mostly during fasting but rare hypoglycemia during the day    HISTORY:  Past Medical History:  Past Medical History:  Diagnosis Date  . 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 s/p Engineer, agricultural PPM in 03/2015  . Bell's palsy   . Bradycardia   . CAD (coronary artery disease)    a. 02/2016 Cath: LM nl, LAD 30p, D1 small w/ severe ostial/mid dzs, LCX nl, RCA nl, EF <25%, glob HK.  . Chronic combined systolic and diastolic CHF (congestive heart failure) ([place])    a. Echo in 03/2015: EF 55-60%, Grade 1 DD;  b. 02/2016 Echo: 25-30%, mod LVH, Gr1 DD, mild MR, mildly dil LA, prominent apical trabeculations.  . Diabetes ([place])   . Hernia of abdominal wall   . HTN (hypertension)   . Hypertension   . NICM (nonischemic cardiomyopathy) ([place])    a. 02/2016 relatively non-obstructive cath, EF 25-30% by echo.   Past Surgical History:  Past Surgical History:  Procedure Laterality Date  . CARDIAC CATHETERIZATION Bilateral  03/01/2016   Procedure: Left Heart Cath and Coronary Angiography;  Surgeon: Minna Merritts, MD;  Location: [facility];  Service: Cardiovascular;  Laterality: Bilateral;  . HERNIA REPAIR    . PACEMAKER INSERTION    . PACEMAKER INSERTION        Social History:  reports that she has never smoked. She has never used smokeless tobacco. She reports that she does not drink alcohol or use drugs.  Family History: family history includes Diabetes in her brother; Diabetes Mellitus I in her grandchild; Skin cancer in her mother; Stroke in her father.   HOME MEDICATIONS: Current Outpatient  Medications on File Prior to Visit  Medication Sig Dispense Refill  . atorvastatin (LIPITOR) 20 MG tablet Take 1 tablet (20 mg total) by mouth daily. 90 tablet 1  . blood glucose meter kit and supplies by Other route as directed. Contour next Meter. Dispense based on patient and insurance preference. Use up to four times daily as directed. (FOR ICD-10 E10.9, E11.9).    . carvedilol (COREG) 3.125 MG tablet Take 1 tablet (3.125 mg total) by mouth 2 (two) times daily with a meal. 180 tablet 0  . Continuous Blood Gluc Receiver (FREESTYLE LIBRE 14 DAY READER) DEVI by Does not apply route.    . Continuous Blood Gluc Sensor (FREESTYLE LIBRE 14 DAY SENSOR) MISC by Does not apply route every 14 (fourteen) days.    [name] ENTRESTO 24-26 MG TAKE 1 TABLET BY MOUTH TWICE DAILY (Patient taking differently: 1 tablet daily. ) 60 tablet 3  . spironolactone (ALDACTONE) 25 MG tablet TAKE 1 TABLET(25 MG) BY MOUTH DAILY 90 tablet 0  . torsemide (DEMADEX) 20 MG tablet TAKE 1 TABLET(20 MG) BY MOUTH DAILY 90 tablet 3   No current facility-administered medications on file prior to visit.       REVIEW OF SYSTEMS: A comprehensive ROS was conducted with the patient and is negative except as per HPI and below:  Review of Systems  Constitutional: Negative.   HENT: Negative.   Eyes: Negative.   Respiratory: Negative.   Cardiovascular: Negative.   Gastrointestinal: Negative for constipation and nausea.  Genitourinary: Negative.   Skin: Negative.   Neurological: Negative for tremors and headaches.  Endo/Heme/Allergies: Negative for polydipsia.  Psychiatric/Behavioral: Negative for depression. The patient is not nervous/anxious.        OBJECTIVE:  VS: BP 114/68 (BP Location: Left Arm, Patient Position: Sitting)   Pulse 70   Ht '5\' 2"'$  (1.575 m)   Wt 65 kg   SpO2 98%   BMI 26.19 kg/m    Wt Readings from Last 3 Encounters:  06/11/18 65 kg  04/23/18 64.9 kg  12/19/17 67 kg     EXAM: General: Pt appears well  and is in NAD  Hydration: Well-hydrated with moist mucous membranes and good skin turgor  Eyes: External eye exam normal without stare, lid lag or exophthalmos.  EOM intact.    Ears, Nose, Throat: Hearing: Grossly intact bilaterally Throat: Clear without mass, erythema or exudate  Neck: General: Supple without adenopathy. Thyroid: Thyroid size normal.  No goiter or nodules appreciated. No thyroid bruit.  Lungs: Clear with good BS bilat with no rales, rhonchi, or wheezes  Heart: Auscultation: RRR.  Abdomen: Normoactive bowel sounds, soft, nontender, without masses or organomegaly palpable  Extremities: Gait and station: Normal gait  BL UE: Normal ROM and strength. BL LE: No pretibial edema normal ROM and strength.  Skin: Hair: Texture and amount normal with gender appropriate  distribution Skin Inspection: No rashes, acanthosis nigricans/skin tags.  Skin Palpation: Skin temperature, texture, and thickness normal to palpation  Neuro: Cranial nerves: II - XII grossly intact  Motor: Normal strength throughout DTRs: 2+ and symmetric in UE without delay in relaxation phase  Mental Status: Judgment, insight: Intact Orientation: Oriented to time, place, and person Mood and affect: No depression, anxiety, or agitation     DATA REVIEWED: Results for KINBERLY, PERRIS (MRN [id number]) as of 06/11/2018 12:38  Ref. Range 06/11/2018 10:43 06/11/2018 11:20  POC Glucose Latest Ref Range: 70 - 99 mg/dl 61 (A) 112 (A)  Results for LUZMARIA, DEVAUX (MRN [id number]) as of 06/11/2018 12:38  Ref. Range 04/15/2018 13:10  Creatinine Latest Ref Range: 0.44 - 1.00 mg/dL 1.90 (H)  GFR, Est Non African American Latest Ref Range: >60 mL/min 27 (L)  GFR, Est African American Latest Ref Range: >60 mL/min 31 (L)    MRI (04/15/18): A left adrenal nodule measures 1.6 cm and demonstrates signal dropout on out of phase imaging, consistent with an adenoma. Normal right adrenal gland.   Old records , labs and images have  been reviewed.    ASSESSMENT/PLAN/RECOMMENDATIONS:   1. Adrenal Incidentaloma :  - We discussed that most adrenal incidentalomas are non-functional, 10-15 % secrete excess amount of hormones.  - Clinically there's no evidence of hormonal excess.  - Will test for plasma metanephrines today.  - We will obtain salivary cortisol x2 - on 2 different days around midnight- we do not have those in the office , she will be called as soon as we get them.  - We also discussed out inability to test for aldosterone /renin activity due to being on spironolactone.  - She was given clear and written instructions, on stopping the spironolactone for 6 weeks.  Patient advised to check her blood pressure daily at home and to call us with a blood pressure that is over 145/90 mmHg, to consider an alternative antihypertensives.  - Will repeat CT imaging with adrenal protocol in 6-12 months. At this point, imaging is reassuring for an adenoma.     2. Type 2 diabetes, poorly controlled, with microvascular complications: Last M0Q per patient is 9.0%  - She was noted to have recurrent hypoglycemia- today she was treated with rule 15 during her office visit. This was due to insulin CHO mismatch for breakfast. - Encouraged patient to use Freestyle libre more often (before meals and at bedtime) also when symptomatic. - We discussed pharmacokinetics of basal/bolus insulin, we discussed ineffective use of sliding scale in hyperglycemia and I would rather put her on scheduled prandial dose.  - Due to poor renal function, kidney is not an option.  - We discussed Levemir as an ineffective 24 hr coverage basal insulin. Will replace with lantus  - Reviewed rule 15 for hypoglycemia - Advised patient to avoid snacks when possible and to eat 3 consistent meals with prandial coverage.   Merdications  - Stop Levemir - Start Lantus at 28 units daily - Humalog 8 units with meals  Time spent with patient 60 minutes. Over 50% of  the time was used in counseling and answering questions about adrenal nodule and diabetes.    F/U in 6 weeks   Signed electronically by: Mack Guise, MD  [facility]  [facility] [address] [place], [place] [address] Phone: [phone] FAX: [phone]   CC: [place] Axe, [place] [address] Phone:  [phone] Fax: [phone]   Return to Endocrinology clinic as below: Future Appointments  Date Time Provider [place]  07/23/2018  8:30 AM Lekesha Claw, Melanie Crazier, MD [facility] None  08/07/2018  3:10 PM [facility] DEVICE REMOTES [facility]  10/21/2018  8:45 AM Diamantina Providence Herbert Seta, MD [facility] None

## 2018-06-09 ENCOUNTER — Ambulatory Visit: Payer: Medicaid Other | Admitting: Internal Medicine

## 2018-06-09 DIAGNOSIS — D3502 Benign neoplasm of left adrenal gland: Secondary | ICD-10-CM | POA: Diagnosis not present

## 2018-06-11 ENCOUNTER — Ambulatory Visit (INDEPENDENT_AMBULATORY_CARE_PROVIDER_SITE_OTHER): Payer: Medicare HMO | Admitting: Internal Medicine

## 2018-06-11 ENCOUNTER — Encounter: Payer: Self-pay | Admitting: Internal Medicine

## 2018-06-11 VITALS — BP 114/68 | HR 70 | Ht 62.0 in | Wt 143.2 lb

## 2018-06-11 DIAGNOSIS — N183 Chronic kidney disease, stage 3 unspecified: Secondary | ICD-10-CM

## 2018-06-11 DIAGNOSIS — E1122 Type 2 diabetes mellitus with diabetic chronic kidney disease: Secondary | ICD-10-CM

## 2018-06-11 DIAGNOSIS — Z794 Long term (current) use of insulin: Secondary | ICD-10-CM | POA: Diagnosis not present

## 2018-06-11 DIAGNOSIS — E278 Other specified disorders of adrenal gland: Secondary | ICD-10-CM

## 2018-06-11 LAB — GLUCOSE, POCT (MANUAL RESULT ENTRY)
POC Glucose: 112 mg/dl — AB (ref 70–99)
POC Glucose: 61 mg/dL — AB (ref 70–99)

## 2018-06-11 MED ORDER — INSULIN GLARGINE 100 UNIT/ML ~~LOC~~ SOLN: 28.0000 [IU] | Freq: Every day | SUBCUTANEOUS | 3 refills | Status: DC

## 2018-06-11 MED ORDER — INSULIN PEN NEEDLE 31G X 5 MM MISC: 3 refills | Status: DC

## 2018-06-11 MED ORDER — HUMALOG KWIKPEN 100 UNIT/ML ~~LOC~~ SOPN: 8.0000 [IU] | PEN_INJECTOR | Freq: Three times a day (TID) | SUBCUTANEOUS | 3 refills | Status: DC

## 2018-06-11 NOTE — Patient Instructions (Addendum)
For Adrenal Nodule:  - Stop Spironolactone for 6 weeks, stop by the labs at 6 weeks.  - Check blood pressure at home, if its over 145/90 please call our office to start you on something that would not interfere with your testing - We will also provide you with a mouth swab, please swab the inner side of your mouth on two different nights, at around midnight.   For Diabetes  - Stop Levemir - Start Lantus at 28 units daily - Humalog 8 units with each meal    - HOW TO TREAT LOW BLOOD SUGARS (Blood sugar LESS THAN 70 MG/DL)  Please follow the RULE OF 15 for the treatment of hypoglycemia treatment (when your (blood sugars are less than 70 mg/dL)    STEP 1: Take 15 grams of carbohydrates when your blood sugar is low, which includes:   3-4 GLUCOSE TABS  OR  3-4 OZ OF JUICE OR REGULAR SODA OR  ONE TUBE OF GLUCOSE GEL     STEP 2: RECHECK blood sugar in 15 MINUTES STEP 3: If your blood sugar is still low at the 15 minute recheck --> then, go back to STEP 1 and treat AGAIN with another 15 grams of carbohydrates.

## 2018-06-13 ENCOUNTER — Telehealth: Payer: Self-pay | Admitting: Internal Medicine

## 2018-06-13 MED ORDER — INSULIN GLARGINE 100 UNITS/ML SOLOSTAR PEN: PEN_INJECTOR | SUBCUTANEOUS | 3 refills | Status: DC

## 2018-06-13 MED ORDER — INSULIN PEN NEEDLE 31G X 6 MM MISC: 3 refills | Status: DC

## 2018-06-13 NOTE — Telephone Encounter (Signed)
Received fax from [facility] and the message read "Pt syays she uses the lantus pens and that she needs a 90 day supply for insurance. Please advise" Called pharmacy and spoke with Barnetta Chapel who states it is not about the Lantus it is that pt needs different needles. Was given the correct needles to send and resent rx to pharmacy. Nothing further is needed

## 2018-06-13 NOTE — Telephone Encounter (Signed)
Called and spoke with pt and she states when she went to pharmacy and what they were trying to fill was a vial of Lantus but she wants the pens. Spoke with Dr. Maretta Bees and she gave verbal ok to switch pt to pens. Rx sent to pharmacy.

## 2018-06-13 NOTE — Telephone Encounter (Signed)
Patient needs RX for Lantus Pen Needles (not the vials-which is what was sent to the Pharmacy-patient does not use vials)  sent to [facility] on [address] in [place]

## 2018-06-14 LAB — METANEPHRINES, PLASMA
METANEPHRINE FREE: 40 pg/mL (ref <=57)
Normetanephrine, Free: 67 pg/mL (ref <=148)
Total Metanephrines-Plasma: 107 pg/mL (ref <=205)

## 2018-06-16 ENCOUNTER — Other Ambulatory Visit: Payer: Medicaid Other

## 2018-06-16 ENCOUNTER — Other Ambulatory Visit: Payer: Self-pay

## 2018-06-16 DIAGNOSIS — E278 Other specified disorders of adrenal gland: Secondary | ICD-10-CM | POA: Diagnosis not present

## 2018-06-16 MED ORDER — INSULIN ASPART 100 UNIT/ML ~~LOC~~ SOLN: 8.0000 [IU] | Freq: Three times a day (TID) | SUBCUTANEOUS | 99 refills | Status: DC

## 2018-06-17 ENCOUNTER — Encounter: Payer: Self-pay | Admitting: Internal Medicine

## 2018-06-21 LAB — SALIVARY CORTISOL X2, TIMED: SALIVARY CORTISOL BASELINE: 0.057 ug/dL

## 2018-06-22 DIAGNOSIS — E1165 Type 2 diabetes mellitus with hyperglycemia: Secondary | ICD-10-CM | POA: Diagnosis not present

## 2018-06-23 ENCOUNTER — Encounter: Payer: Self-pay | Admitting: Internal Medicine

## 2018-06-30 DIAGNOSIS — D3502 Benign neoplasm of left adrenal gland: Secondary | ICD-10-CM | POA: Diagnosis not present

## 2018-07-23 ENCOUNTER — Ambulatory Visit: Payer: Medicaid Other | Admitting: Internal Medicine

## 2018-07-23 DIAGNOSIS — I129 Hypertensive chronic kidney disease with stage 1 through stage 4 chronic kidney disease, or unspecified chronic kidney disease: Secondary | ICD-10-CM | POA: Diagnosis not present

## 2018-07-23 DIAGNOSIS — R8281 Pyuria: Secondary | ICD-10-CM | POA: Diagnosis not present

## 2018-07-23 DIAGNOSIS — R8290 Unspecified abnormal findings in urine: Secondary | ICD-10-CM | POA: Diagnosis not present

## 2018-07-23 DIAGNOSIS — N179 Acute kidney failure, unspecified: Secondary | ICD-10-CM | POA: Diagnosis not present

## 2018-07-23 DIAGNOSIS — N183 Chronic kidney disease, stage 3 (moderate): Secondary | ICD-10-CM | POA: Diagnosis not present

## 2018-07-23 DIAGNOSIS — E1129 Type 2 diabetes mellitus with other diabetic kidney complication: Secondary | ICD-10-CM | POA: Diagnosis not present

## 2018-07-29 DIAGNOSIS — E1165 Type 2 diabetes mellitus with hyperglycemia: Secondary | ICD-10-CM | POA: Diagnosis not present

## 2018-08-07 ENCOUNTER — Ambulatory Visit (INDEPENDENT_AMBULATORY_CARE_PROVIDER_SITE_OTHER): Payer: Medicare HMO

## 2018-08-07 DIAGNOSIS — I441 Atrioventricular block, second degree: Secondary | ICD-10-CM | POA: Diagnosis not present

## 2018-08-07 DIAGNOSIS — I442 Atrioventricular block, complete: Secondary | ICD-10-CM

## 2018-08-07 NOTE — Progress Notes (Signed)
Remote pacemaker transmission.   

## 2018-08-10 LAB — CUP PACEART REMOTE DEVICE CHECK
Implantable Lead Implant Date: 20160810
Implantable Lead Implant Date: 20160810
Implantable Lead Location: 753859
Implantable Lead Location: 753860
Implantable Lead Model: 7740
Implantable Lead Model: 7741
Implantable Pulse Generator Implant Date: 20160810
MDC IDC LEAD SERIAL: 640659
MDC IDC LEAD SERIAL: 682646
MDC IDC SESS DTM: 20191229191428
Pulse Gen Serial Number: 722311

## 2018-08-12 ENCOUNTER — Encounter: Payer: Self-pay | Admitting: Cardiology

## 2018-08-14 DIAGNOSIS — E1129 Type 2 diabetes mellitus with other diabetic kidney complication: Secondary | ICD-10-CM | POA: Diagnosis not present

## 2018-08-14 DIAGNOSIS — I129 Hypertensive chronic kidney disease with stage 1 through stage 4 chronic kidney disease, or unspecified chronic kidney disease: Secondary | ICD-10-CM | POA: Diagnosis not present

## 2018-08-14 DIAGNOSIS — N179 Acute kidney failure, unspecified: Secondary | ICD-10-CM | POA: Diagnosis not present

## 2018-08-14 DIAGNOSIS — N183 Chronic kidney disease, stage 3 (moderate): Secondary | ICD-10-CM | POA: Diagnosis not present

## 2018-08-29 DIAGNOSIS — E7800 Pure hypercholesterolemia, unspecified: Secondary | ICD-10-CM | POA: Diagnosis not present

## 2018-08-29 DIAGNOSIS — E1165 Type 2 diabetes mellitus with hyperglycemia: Secondary | ICD-10-CM | POA: Diagnosis not present

## 2018-08-29 DIAGNOSIS — N189 Chronic kidney disease, unspecified: Secondary | ICD-10-CM | POA: Diagnosis not present

## 2018-08-29 DIAGNOSIS — N289 Disorder of kidney and ureter, unspecified: Secondary | ICD-10-CM | POA: Diagnosis not present

## 2018-09-01 DIAGNOSIS — E113413 Type 2 diabetes mellitus with severe nonproliferative diabetic retinopathy with macular edema, bilateral: Secondary | ICD-10-CM | POA: Diagnosis not present

## 2018-09-01 DIAGNOSIS — H2513 Age-related nuclear cataract, bilateral: Secondary | ICD-10-CM | POA: Diagnosis not present

## 2018-09-05 ENCOUNTER — Other Ambulatory Visit: Payer: Self-pay | Admitting: Internal Medicine

## 2018-09-05 DIAGNOSIS — M8589 Other specified disorders of bone density and structure, multiple sites: Secondary | ICD-10-CM | POA: Diagnosis not present

## 2018-09-05 DIAGNOSIS — Z0000 Encounter for general adult medical examination without abnormal findings: Secondary | ICD-10-CM | POA: Diagnosis not present

## 2018-09-05 DIAGNOSIS — Z1239 Encounter for other screening for malignant neoplasm of breast: Secondary | ICD-10-CM | POA: Diagnosis not present

## 2018-09-05 DIAGNOSIS — Z1231 Encounter for screening mammogram for malignant neoplasm of breast: Secondary | ICD-10-CM

## 2018-09-05 DIAGNOSIS — I10 Essential (primary) hypertension: Secondary | ICD-10-CM | POA: Diagnosis not present

## 2018-09-10 DIAGNOSIS — D649 Anemia, unspecified: Secondary | ICD-10-CM | POA: Diagnosis not present

## 2018-09-10 DIAGNOSIS — M8588 Other specified disorders of bone density and structure, other site: Secondary | ICD-10-CM | POA: Diagnosis not present

## 2018-09-10 DIAGNOSIS — E1159 Type 2 diabetes mellitus with other circulatory complications: Secondary | ICD-10-CM | POA: Diagnosis not present

## 2018-09-24 DIAGNOSIS — I129 Hypertensive chronic kidney disease with stage 1 through stage 4 chronic kidney disease, or unspecified chronic kidney disease: Secondary | ICD-10-CM | POA: Diagnosis not present

## 2018-09-24 DIAGNOSIS — N2581 Secondary hyperparathyroidism of renal origin: Secondary | ICD-10-CM | POA: Diagnosis not present

## 2018-09-24 DIAGNOSIS — N183 Chronic kidney disease, stage 3 (moderate): Secondary | ICD-10-CM | POA: Diagnosis not present

## 2018-09-24 DIAGNOSIS — E1129 Type 2 diabetes mellitus with other diabetic kidney complication: Secondary | ICD-10-CM | POA: Diagnosis not present

## 2018-09-24 DIAGNOSIS — R8281 Pyuria: Secondary | ICD-10-CM | POA: Diagnosis not present

## 2018-09-24 DIAGNOSIS — N179 Acute kidney failure, unspecified: Secondary | ICD-10-CM | POA: Diagnosis not present

## 2018-09-29 DIAGNOSIS — E1165 Type 2 diabetes mellitus with hyperglycemia: Secondary | ICD-10-CM | POA: Diagnosis not present

## 2018-10-13 DIAGNOSIS — E113413 Type 2 diabetes mellitus with severe nonproliferative diabetic retinopathy with macular edema, bilateral: Secondary | ICD-10-CM | POA: Diagnosis not present

## 2018-10-20 ENCOUNTER — Ambulatory Visit (HOSPITAL_COMMUNITY): Payer: Medicare HMO

## 2018-10-20 ENCOUNTER — Ambulatory Visit (HOSPITAL_COMMUNITY)
Admission: RE | Admit: 2018-10-20 | Discharge: 2018-10-20 | Disposition: A | Discharge status: Home / Self Care | Payer: Medicare HMO | Source: Ambulatory Visit | Attending: Urology | Admitting: Urology

## 2018-10-20 DIAGNOSIS — E1129 Type 2 diabetes mellitus with other diabetic kidney complication: Secondary | ICD-10-CM | POA: Diagnosis not present

## 2018-10-20 DIAGNOSIS — E1165 Type 2 diabetes mellitus with hyperglycemia: Secondary | ICD-10-CM | POA: Diagnosis not present

## 2018-10-20 DIAGNOSIS — N281 Cyst of kidney, acquired: Secondary | ICD-10-CM | POA: Diagnosis not present

## 2018-10-20 DIAGNOSIS — E1159 Type 2 diabetes mellitus with other circulatory complications: Secondary | ICD-10-CM | POA: Diagnosis not present

## 2018-10-20 DIAGNOSIS — Z794 Long term (current) use of insulin: Secondary | ICD-10-CM | POA: Diagnosis not present

## 2018-10-20 DIAGNOSIS — D3502 Benign neoplasm of left adrenal gland: Secondary | ICD-10-CM | POA: Diagnosis not present

## 2018-10-20 DIAGNOSIS — N184 Chronic kidney disease, stage 4 (severe): Secondary | ICD-10-CM | POA: Diagnosis not present

## 2018-10-20 DIAGNOSIS — E1122 Type 2 diabetes mellitus with diabetic chronic kidney disease: Secondary | ICD-10-CM | POA: Diagnosis not present

## 2018-10-20 DIAGNOSIS — R809 Proteinuria, unspecified: Secondary | ICD-10-CM | POA: Diagnosis not present

## 2018-10-20 DIAGNOSIS — E113293 Type 2 diabetes mellitus with mild nonproliferative diabetic retinopathy without macular edema, bilateral: Secondary | ICD-10-CM | POA: Diagnosis not present

## 2018-10-20 LAB — CREATININE, SERUM
Creatinine, Ser: 1.84 mg/dL — ABNORMAL HIGH (ref 0.44–1.00)
GFR calc Af Amer: 33 mL/min — ABNORMAL LOW (ref >60)
GFR, EST NON AFRICAN AMERICAN: 28 mL/min — AB (ref >60)

## 2018-10-20 MED ORDER — GADOBUTROL 1 MMOL/ML IV SOLN
7.0000 mL | Freq: Once | INTRAVENOUS | Status: AC | PRN
  Administered 2018-10-20: 7 mL via INTRAVENOUS

## 2018-10-21 ENCOUNTER — Encounter: Payer: Self-pay | Admitting: Urology

## 2018-10-21 ENCOUNTER — Ambulatory Visit (INDEPENDENT_AMBULATORY_CARE_PROVIDER_SITE_OTHER): Payer: Medicare HMO | Admitting: Urology

## 2018-10-21 VITALS — BP 160/83 | HR 76 | Ht 62.0 in | Wt 148.0 lb

## 2018-10-21 DIAGNOSIS — N281 Cyst of kidney, acquired: Secondary | ICD-10-CM

## 2018-10-21 DIAGNOSIS — E278 Other specified disorders of adrenal gland: Secondary | ICD-10-CM

## 2018-10-21 DIAGNOSIS — E279 Disorder of adrenal gland, unspecified: Secondary | ICD-10-CM

## 2018-10-21 NOTE — Patient Instructions (Signed)
You have simple cysts on your kidneys. These were evaluated with MRI and do not require further follow up.  You also have an adrenal nodule, this is non-functional and does not need further evaluation. Keep follow up in 6-12 months with Endocrinology for repeat CT scan. If it is stable at that time, you likely will not require any further imaging.

## 2018-10-21 NOTE — Progress Notes (Signed)
   10/21/2018 8:51 AM   Bethany Gillespie 1950-11-04 [id number]  Reason for visit: Follow up complex renal cysts, left adrenal nodule 1.6cm  HPI: I saw Bethany Gillespie back in urology clinic today for the above issues.  Briefly, she is a 68 year old female with a number of comorbidities including CAD, CHF, poorly controlled diabetes, and hypertension.  She had a renal ultrasound performed for an elevated creatinine of 1.9 which showed a possible renal mass, which prompted an MRI without contrast in September 2019.  This showed a 1.6 cm left adrenal adenoma, and simple and complex cysts bilaterally.  Follow-up MRI with contrast was recommended.  At our last visit, I referred her to endocrinology for further work-up of her adrenal adenoma, which was all negative.  She is here today to review the results of her MRI with contrast.  This showed multiple typical Bosniak 1 renal cyst in the left and right kidney, and a solitary Bosniak 2 hemorrhagic cyst in the upper pole of the right kidney.  These do not require further work-up per surveillance.  I reviewed the etiology of renal cyst with the patient today, and discussed that these are very common and 50% of patients over 50 have these findings on imaging.  Her MRI findings are not worrisome for malignancy, and she does not require further surveillance.  -She will follow-up in 6 to 12 months with endocrinology for repeat CT to evaluate for any change in size in her adrenal adenoma -Follow-up as needed with urology  A total of 15 minutes were spent face-to-face with the patient, greater than 50% was spent in patient education, counseling, and coordination of care regarding renal cysts and adrenal nodule.  Billey Co, [facility] [address] [place], [place] [address] [phone]

## 2018-10-23 ENCOUNTER — Telehealth: Payer: Self-pay | Admitting: Cardiovascular Disease

## 2018-10-23 NOTE — Telephone Encounter (Signed)
Pt c/o medication issue:  1. Name of Medication: atorvastatin (LIPITOR) - not on medication list  2. How are you currently taking this medication (dosage and times per day)? 20 MG 1 tablet daily   3. Are you having a reaction (difficulty breathing--STAT)? Causing kidney failure   4. What is your medication issue? Patient states she is taking lipitor and she would like to stop it.  States that it is causing kidney failure.  Would like to discuss with nurse, please call.

## 2018-10-23 NOTE — Telephone Encounter (Signed)
Call placed to the patient. She stated that she will not be taking the Atorvastatin anymore due to studies showing that it can cause kidney failure. Atorvastatin is not currently on her medication and there is not any record in epic of when she was taken off. She stated that she has been taking it up until now.  She is also not taking Torsemide and Spirolactone. She stated that her kidney doctor discontinued these medications in January/February due to kidney failure.

## 2018-10-24 NOTE — Telephone Encounter (Signed)
She is due for follow-up with me.  Schedule a follow-up visit and we can discuss atorvastatin then. Atorvastatin does not cause renal failure.

## 2018-11-04 DIAGNOSIS — E1165 Type 2 diabetes mellitus with hyperglycemia: Secondary | ICD-10-CM | POA: Diagnosis not present

## 2018-11-06 ENCOUNTER — Other Ambulatory Visit: Payer: Self-pay

## 2018-11-06 ENCOUNTER — Ambulatory Visit (INDEPENDENT_AMBULATORY_CARE_PROVIDER_SITE_OTHER): Payer: Medicare HMO | Admitting: *Deleted

## 2018-11-06 DIAGNOSIS — I442 Atrioventricular block, complete: Secondary | ICD-10-CM

## 2018-11-06 LAB — CUP PACEART REMOTE DEVICE CHECK
Brady Statistic RA Percent Paced: 1 %
Brady Statistic RV Percent Paced: 100 %
Implantable Lead Implant Date: 20160810
Implantable Lead Implant Date: 20160810
Implantable Lead Location: 753859
Implantable Lead Model: 7740
Implantable Lead Serial Number: 682646
Lead Channel Setting Pacing Amplitude: 2 V
Lead Channel Setting Pacing Amplitude: 2.4 V
Lead Channel Setting Pacing Pulse Width: 0.5 ms
Lead Channel Setting Sensing Sensitivity: 2.5 mV
MDC IDC LEAD LOCATION: 753860
MDC IDC LEAD SERIAL: 640659
MDC IDC MSMT BATTERY REMAINING LONGEVITY: 114 mo
MDC IDC MSMT BATTERY REMAINING PERCENTAGE: 100 %
MDC IDC MSMT LEADCHNL RA IMPEDANCE VALUE: 716 Ohm
MDC IDC MSMT LEADCHNL RV IMPEDANCE VALUE: 566 Ohm
MDC IDC PG IMPLANT DT: 20160810
MDC IDC SESS DTM: 20200326064400
Pulse Gen Serial Number: 722311

## 2018-11-12 ENCOUNTER — Encounter: Payer: Self-pay | Admitting: Cardiology

## 2018-11-12 NOTE — Progress Notes (Signed)
Remote pacemaker transmission.   

## 2018-11-19 DIAGNOSIS — I129 Hypertensive chronic kidney disease with stage 1 through stage 4 chronic kidney disease, or unspecified chronic kidney disease: Secondary | ICD-10-CM | POA: Diagnosis not present

## 2018-11-19 DIAGNOSIS — E1129 Type 2 diabetes mellitus with other diabetic kidney complication: Secondary | ICD-10-CM | POA: Diagnosis not present

## 2018-11-19 DIAGNOSIS — N2581 Secondary hyperparathyroidism of renal origin: Secondary | ICD-10-CM | POA: Diagnosis not present

## 2018-11-19 DIAGNOSIS — N184 Chronic kidney disease, stage 4 (severe): Secondary | ICD-10-CM | POA: Diagnosis not present

## 2018-12-05 DIAGNOSIS — E1165 Type 2 diabetes mellitus with hyperglycemia: Secondary | ICD-10-CM | POA: Diagnosis not present

## 2018-12-15 DIAGNOSIS — I10 Essential (primary) hypertension: Secondary | ICD-10-CM | POA: Diagnosis not present

## 2018-12-15 DIAGNOSIS — D508 Other iron deficiency anemias: Secondary | ICD-10-CM | POA: Diagnosis not present

## 2018-12-18 ENCOUNTER — Telehealth: Payer: Self-pay

## 2018-12-18 NOTE — Telephone Encounter (Signed)

## 2018-12-19 ENCOUNTER — Encounter: Payer: Self-pay | Admitting: Cardiovascular Disease

## 2018-12-19 ENCOUNTER — Other Ambulatory Visit: Payer: Self-pay

## 2018-12-19 ENCOUNTER — Telehealth (INDEPENDENT_AMBULATORY_CARE_PROVIDER_SITE_OTHER): Payer: Medicare HMO | Admitting: Cardiovascular Disease

## 2018-12-19 VITALS — BP 112/76 | HR 63 | Ht 62.0 in | Wt 151.0 lb

## 2018-12-19 DIAGNOSIS — I5022 Chronic systolic (congestive) heart failure: Secondary | ICD-10-CM

## 2018-12-19 MED ORDER — SACUBITRIL-VALSARTAN 24-26 MG PO TABS: 1.0000 | ORAL_TABLET | Freq: Every day | ORAL | 3 refills | Status: DC

## 2018-12-19 NOTE — Progress Notes (Signed)
Virtual Visit via Video Note   This visit type was conducted due to national recommendations for restrictions regarding the COVID-19 Pandemic (e.g. social distancing) in an effort to limit this patient's exposure and mitigate transmission in our community.  Due to her co-morbid illnesses, this patient is at least at moderate risk for complications without adequate follow up.  This format is felt to be most appropriate for this patient at this time.  All issues noted in this document were discussed and addressed.  A limited physical exam was performed with this format.  Please refer to the patient's chart for her consent to telehealth for [facility].   Date:  12/19/2018   ID:  Bethany Gillespie, DOB 1951/03/11, MRN [id number]  Patient Location: Home Provider Location: Office  PCP:  Glendon Axe, MD  Cardiologist:  Kathlyn Sacramento, MD  Electrophysiologist:  None   Evaluation Performed:  Follow-Up Visit  Chief Complaint: No cardiac complaints  History of Present Illness:    Bethany Gillespie is a 68 y.o. female was seen via video for a follow-up visit regarding chronic systolic heart failure due to nonischemic cardiomyopathy. This was diagnosed in July 2017 with an ejection fraction of 25-30%. Cardiac catheterization showed small vessel coronary artery disease with no obstructive disease affecting the main arteries. She had previous dual-chamber pacemaker placement in August 2016 in [place]. She has other chronic medical conditions that include diabetes mellitus and essential hypertension. She had a gradual improvement in ejection fraction on medical therapy with most recent echo in July, 2018 showing an EF of 50 to 55%. She has been doing well with no recent chest pain, shortness of breath or palpitations.  Over the last year, she had gradual worsening of renal function with creatinine of 1.84 in March of this year.  She was taken off spironolactone and torsemide due to that and she has been  seeing Dr. Candiss Norse. He denies chest pain or worsening dyspnea.  She did gain some weight but she denies leg edema.   The patient does not have symptoms concerning for COVID-19 infection (fever, chills, cough, or new shortness of breath).    Past Medical History:  Diagnosis Date  . 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 s/p Engineer, agricultural PPM in 03/2015  . Bell's palsy   . Bradycardia   . CAD (coronary artery disease)    a. 02/2016 Cath: LM nl, LAD 30p, D1 small w/ severe ostial/mid dzs, LCX nl, RCA nl, EF <25%, glob HK.  . Chronic combined systolic and diastolic CHF (congestive heart failure) ([place])    a. Echo in 03/2015: EF 55-60%, Grade 1 DD;  b. 02/2016 Echo: 25-30%, mod LVH, Gr1 DD, mild MR, mildly dil LA, prominent apical trabeculations.  . Diabetes ([place])   . Hernia of abdominal wall   . HTN (hypertension)   . Hypertension   . NICM (nonischemic cardiomyopathy) ([place])    a. 02/2016 relatively non-obstructive cath, EF 25-30% by echo.   Past Surgical History:  Procedure Laterality Date  . CARDIAC CATHETERIZATION Bilateral 03/01/2016   Procedure: Left Heart Cath and Coronary Angiography;  Surgeon: Minna Merritts, MD;  Location: [place] [facility];  Service: Cardiovascular;  Laterality: Bilateral;  . HERNIA REPAIR    . PACEMAKER INSERTION    . PACEMAKER INSERTION       Current Meds  Medication Sig  . amLODipine (NORVASC) 5 MG tablet Take 5 mg by mouth daily.   . blood glucose meter kit and  supplies by Other route as directed. Contour next Meter. Dispense based on patient and insurance preference. Use up to four times daily as directed. (FOR ICD-10 E10.9, E11.9).  . carvedilol (COREG) 3.125 MG tablet Take 3.125 mg by mouth 2 (two) times daily with a meal.   . Continuous Blood Gluc Receiver (FREESTYLE LIBRE 14 DAY READER) DEVI by Does not apply route.  . Continuous Blood Gluc Sensor (FREESTYLE LIBRE 14 DAY SENSOR) MISC by Does not apply route every 14 (fourteen) days.  [name] ENTRESTO  24-26 MG TAKE 1 TABLET BY MOUTH TWICE DAILY (Patient taking differently: 1 tablet daily. )  . ferrous sulfate 325 (65 FE) MG tablet Take 325 mg by mouth daily with breakfast.  . Insulin Aspart, w/Niacinamide, (FIASP FLEXTOUCH) 100 UNIT/ML SOPN Inject 8 Units into the skin 4 (four) times daily.  . insulin glargine (LANTUS) 100 unit/mL SOPN Inject 0.28 mLs (28 units) into the skin daily (Patient taking differently: 30 Units. Inject 0.28 mLs (28 units) into the skin daily)  . Insulin Pen Needle 31G X 6 MM MISC Use 4 times daily     Allergies:   Bee venom   Social History   Tobacco Use  . Smoking status: Never Smoker  . Smokeless tobacco: Never Used  Substance Use Topics  . Alcohol use: No  . Drug use: No     Family Hx: The patient's family history includes Diabetes in her brother; Diabetes Mellitus I in her grandchild; Skin cancer in her mother; Stroke in her father.  ROS:   Please see the history of present illness.     All other systems reviewed and are negative.   Prior CV studies:   The following studies were reviewed today:    Labs/Other Tests and Data Reviewed:    EKG:  No ECG reviewed.  Recent Labs: 10/20/2018: Creatinine, Ser 1.84   Recent Lipid Panel No results found for: CHOL, TRIG, HDL, CHOLHDL, LDLCALC, LDLDIRECT  Wt Readings from Last 3 Encounters:  12/19/18 151 lb (68.5 kg)  10/21/18 148 lb (67.1 kg)  06/11/18 143 lb 3.2 oz (65 kg)     Objective:    Vital Signs:  BP 112/76   Pulse 63   Ht '5\' 2"'$  (1.575 m)   Wt 151 lb (68.5 kg)   BMI 27.62 kg/m    VITAL SIGNS:  reviewed GEN:  no acute distress EYES:  sclerae anicteric, EOMI - Extraocular Movements Intact RESPIRATORY:  normal respiratory effort, symmetric expansion CARDIOVASCULAR:  no peripheral edema SKIN:  no rash, lesions or ulcers. MUSCULOSKELETAL:  no obvious deformities. NEURO:  alert and oriented x 3, no obvious focal deficit PSYCH:  normal affect  ASSESSMENT & PLAN:    1.  Chronic  systolic heart failure: Most recent ejection fraction was 50-55%. She is currently New York Heart Association class II.    She was taken off torsemide and spironolactone few months ago due to worsening renal function.  I agree with this as she appears to be euvolemic.  I asked her to continue monitoring her weight and any signs of volume overload.  Continue treatment with carvedilol and Entresto.   2. Essential hypertension: Blood pressure is controlled on current medications.  3. History of second-degree AV block status post dual-chamber pacemaker placement: This is followed by our device clinic.  4. Diabetes mellitus, managed by primary care physician.  COVID-19 Education: The signs and symptoms of COVID-19 were discussed with the patient and how to seek care for testing (follow up  with PCP or arrange E-visit).  The importance of social distancing was discussed today.  Time:   Today, I have spent 16 minutes with the patient with telehealth technology discussing the above problems.     Medication Adjustments/Labs and Tests Ordered: Current medicines are reviewed at length with the patient today.  Concerns regarding medicines are outlined above.   Tests Ordered: No orders of the defined types were placed in this encounter.   Medication Changes: No orders of the defined types were placed in this encounter.   Disposition:  Follow up in 6 month(s)  Signed, Kathlyn Sacramento, MD  12/19/2018 2:17 PM    [facility]

## 2018-12-19 NOTE — Patient Instructions (Signed)
Medication Instructions:  No change in medications If you need a refill on your cardiac medications before your next appointment, please call your pharmacy.   Lab work: None If you have labs (blood work) drawn today and your tests are completely normal, you will receive your results only by: . MyChart Message (if you have MyChart) OR . A paper copy in the mail If you have any lab test that is abnormal or we need to change your treatment, we will call you to review the results.  Testing/Procedures: None  Follow-Up: At [facility], you and your health needs are our priority.  As part of our continuing mission to provide you with exceptional heart care, we have created designated Provider Care Teams.  These Care Teams include your primary Cardiologist (physician) and Advanced Practice Providers (APPs -  Physician Assistants and Nurse Practitioners) who all work together to provide you with the care you need, when you need it. You will need a follow up appointment in 6 months.  Please call our office 2 months in advance to schedule this appointment.  You may see Muhammad Arida, MD or one of the following Advanced Practice Providers on your designated Care Team:   Christopher Berge, NP Ryan Dunn, PA-C . Jacquelyn Visser, PA-C   

## 2019-01-04 DIAGNOSIS — E1165 Type 2 diabetes mellitus with hyperglycemia: Secondary | ICD-10-CM | POA: Diagnosis not present

## 2019-01-06 DIAGNOSIS — E113413 Type 2 diabetes mellitus with severe nonproliferative diabetic retinopathy with macular edema, bilateral: Secondary | ICD-10-CM | POA: Diagnosis not present

## 2019-01-23 DIAGNOSIS — H52223 Regular astigmatism, bilateral: Secondary | ICD-10-CM | POA: Diagnosis not present

## 2019-01-23 DIAGNOSIS — H524 Presbyopia: Secondary | ICD-10-CM | POA: Diagnosis not present

## 2019-01-28 DIAGNOSIS — Z794 Long term (current) use of insulin: Secondary | ICD-10-CM | POA: Diagnosis not present

## 2019-01-28 DIAGNOSIS — R809 Proteinuria, unspecified: Secondary | ICD-10-CM | POA: Diagnosis not present

## 2019-01-28 DIAGNOSIS — N184 Chronic kidney disease, stage 4 (severe): Secondary | ICD-10-CM | POA: Diagnosis not present

## 2019-01-28 DIAGNOSIS — E1159 Type 2 diabetes mellitus with other circulatory complications: Secondary | ICD-10-CM | POA: Diagnosis not present

## 2019-01-28 DIAGNOSIS — E1129 Type 2 diabetes mellitus with other diabetic kidney complication: Secondary | ICD-10-CM | POA: Diagnosis not present

## 2019-01-28 DIAGNOSIS — E1122 Type 2 diabetes mellitus with diabetic chronic kidney disease: Secondary | ICD-10-CM | POA: Diagnosis not present

## 2019-01-28 DIAGNOSIS — E113293 Type 2 diabetes mellitus with mild nonproliferative diabetic retinopathy without macular edema, bilateral: Secondary | ICD-10-CM | POA: Diagnosis not present

## 2019-02-02 ENCOUNTER — Other Ambulatory Visit: Payer: Self-pay | Admitting: Cardiovascular Disease

## 2019-02-02 DIAGNOSIS — E1165 Type 2 diabetes mellitus with hyperglycemia: Secondary | ICD-10-CM | POA: Diagnosis not present

## 2019-02-02 MED ORDER — CARVEDILOL 3.125 MG PO TABS: 3.1250 mg | ORAL_TABLET | Freq: Two times a day (BID) | ORAL | 3 refills | Status: DC

## 2019-02-02 NOTE — Telephone Encounter (Signed)
°*  STAT* If patient is at the pharmacy, call can be transferred to refill team.   1. Which medications need to be refilled? (please list name of each medication and dose if known)  Carvedilol 3.125 mg po BID  2. Which pharmacy/location (including street and city if local pharmacy) is medication to be sent to? [facility] [address] [place]   3. Do they need a 30 day or 90 day supply? [place]

## 2019-02-05 ENCOUNTER — Ambulatory Visit (INDEPENDENT_AMBULATORY_CARE_PROVIDER_SITE_OTHER): Payer: Medicare HMO | Admitting: *Deleted

## 2019-02-05 DIAGNOSIS — I442 Atrioventricular block, complete: Secondary | ICD-10-CM | POA: Diagnosis not present

## 2019-02-06 LAB — CUP PACEART REMOTE DEVICE CHECK
Battery Remaining Longevity: 114 mo
Battery Remaining Percentage: 100 %
Brady Statistic RA Percent Paced: 1 %
Brady Statistic RV Percent Paced: 100 %
Date Time Interrogation Session: 20200625063100
Implantable Lead Implant Date: 20160810
Implantable Lead Implant Date: 20160810
Implantable Lead Location: 753859
Implantable Lead Location: 753860
Implantable Lead Model: 7740
Implantable Lead Model: 7741
Implantable Lead Serial Number: 640659
Implantable Lead Serial Number: 682646
Implantable Pulse Generator Implant Date: 20160810
Lead Channel Impedance Value: 594 Ohm
Lead Channel Impedance Value: 604 Ohm
Lead Channel Setting Pacing Amplitude: 2 V
Lead Channel Setting Pacing Amplitude: 2.4 V
Lead Channel Setting Pacing Pulse Width: 0.5 ms
Lead Channel Setting Sensing Sensitivity: 2.5 mV
Pulse Gen Serial Number: 722311

## 2019-02-16 ENCOUNTER — Encounter: Payer: Self-pay | Admitting: Cardiology

## 2019-02-16 NOTE — Progress Notes (Signed)
Remote pacemaker transmission.   

## 2019-02-17 DIAGNOSIS — E113413 Type 2 diabetes mellitus with severe nonproliferative diabetic retinopathy with macular edema, bilateral: Secondary | ICD-10-CM | POA: Diagnosis not present

## 2019-03-06 ENCOUNTER — Other Ambulatory Visit: Payer: Self-pay | Admitting: Internal Medicine

## 2019-03-06 DIAGNOSIS — R935 Abnormal findings on diagnostic imaging of other abdominal regions, including retroperitoneum: Secondary | ICD-10-CM

## 2019-03-06 DIAGNOSIS — D509 Iron deficiency anemia, unspecified: Secondary | ICD-10-CM

## 2019-03-12 ENCOUNTER — Ambulatory Visit: Payer: Medicare HMO | Attending: Internal Medicine

## 2019-03-19 DIAGNOSIS — K5909 Other constipation: Secondary | ICD-10-CM | POA: Diagnosis not present

## 2019-03-19 DIAGNOSIS — R809 Proteinuria, unspecified: Secondary | ICD-10-CM | POA: Diagnosis not present

## 2019-03-19 DIAGNOSIS — R635 Abnormal weight gain: Secondary | ICD-10-CM | POA: Diagnosis not present

## 2019-03-19 DIAGNOSIS — E1129 Type 2 diabetes mellitus with other diabetic kidney complication: Secondary | ICD-10-CM | POA: Diagnosis not present

## 2019-03-19 DIAGNOSIS — R140 Abdominal distension (gaseous): Secondary | ICD-10-CM | POA: Diagnosis not present

## 2019-03-19 DIAGNOSIS — Z950 Presence of cardiac pacemaker: Secondary | ICD-10-CM | POA: Diagnosis not present

## 2019-03-19 DIAGNOSIS — N183 Chronic kidney disease, stage 3 (moderate): Secondary | ICD-10-CM | POA: Diagnosis not present

## 2019-03-19 DIAGNOSIS — D500 Iron deficiency anemia secondary to blood loss (chronic): Secondary | ICD-10-CM | POA: Diagnosis not present

## 2019-03-19 DIAGNOSIS — I10 Essential (primary) hypertension: Secondary | ICD-10-CM | POA: Diagnosis not present

## 2019-03-20 DIAGNOSIS — E1159 Type 2 diabetes mellitus with other circulatory complications: Secondary | ICD-10-CM | POA: Diagnosis not present

## 2019-03-20 DIAGNOSIS — I10 Essential (primary) hypertension: Secondary | ICD-10-CM | POA: Diagnosis not present

## 2019-03-20 DIAGNOSIS — E7800 Pure hypercholesterolemia, unspecified: Secondary | ICD-10-CM | POA: Diagnosis not present

## 2019-03-20 DIAGNOSIS — N183 Chronic kidney disease, stage 3 (moderate): Secondary | ICD-10-CM | POA: Diagnosis not present

## 2019-03-25 DIAGNOSIS — E1122 Type 2 diabetes mellitus with diabetic chronic kidney disease: Secondary | ICD-10-CM | POA: Diagnosis not present

## 2019-03-25 DIAGNOSIS — D500 Iron deficiency anemia secondary to blood loss (chronic): Secondary | ICD-10-CM | POA: Diagnosis not present

## 2019-03-25 DIAGNOSIS — R8290 Unspecified abnormal findings in urine: Secondary | ICD-10-CM | POA: Diagnosis not present

## 2019-03-25 DIAGNOSIS — N281 Cyst of kidney, acquired: Secondary | ICD-10-CM | POA: Diagnosis not present

## 2019-03-25 DIAGNOSIS — N184 Chronic kidney disease, stage 4 (severe): Secondary | ICD-10-CM | POA: Diagnosis not present

## 2019-03-25 DIAGNOSIS — R600 Localized edema: Secondary | ICD-10-CM | POA: Diagnosis not present

## 2019-03-25 DIAGNOSIS — I129 Hypertensive chronic kidney disease with stage 1 through stage 4 chronic kidney disease, or unspecified chronic kidney disease: Secondary | ICD-10-CM | POA: Diagnosis not present

## 2019-03-26 ENCOUNTER — Other Ambulatory Visit: Payer: Self-pay

## 2019-03-26 ENCOUNTER — Ambulatory Visit
Admission: RE | Admit: 2019-03-26 | Discharge: 2019-03-26 | Disposition: A | Discharge status: Home / Self Care | Payer: Medicare HMO | Source: Ambulatory Visit | Attending: Internal Medicine | Admitting: Internal Medicine

## 2019-03-26 DIAGNOSIS — N281 Cyst of kidney, acquired: Secondary | ICD-10-CM | POA: Diagnosis not present

## 2019-03-26 DIAGNOSIS — D509 Iron deficiency anemia, unspecified: Secondary | ICD-10-CM | POA: Diagnosis not present

## 2019-03-26 DIAGNOSIS — R935 Abnormal findings on diagnostic imaging of other abdominal regions, including retroperitoneum: Secondary | ICD-10-CM | POA: Insufficient documentation

## 2019-04-14 DIAGNOSIS — E113413 Type 2 diabetes mellitus with severe nonproliferative diabetic retinopathy with macular edema, bilateral: Secondary | ICD-10-CM | POA: Diagnosis not present

## 2019-04-21 ENCOUNTER — Telehealth: Payer: Self-pay | Admitting: Internal Medicine

## 2019-04-21 NOTE — Telephone Encounter (Signed)
°*  STAT* If patient is at the pharmacy, call can be transferred to refill team.   1. Which medications need to be refilled? (please list name of each medication and dose if known) amlodipine 5mg   2. Which pharmacy/location (including street and city if local pharmacy) is medication to be sent to? [facility] [address]  3. Do they need a 30 day or 90 day supply? 90 day

## 2019-04-22 NOTE — Telephone Encounter (Signed)
Patient needs to contact PCP for refill on Amlodipine.

## 2019-05-01 ENCOUNTER — Other Ambulatory Visit: Payer: Self-pay

## 2019-05-01 ENCOUNTER — Encounter
Admission: RE | Admit: 2019-05-01 | Discharge: 2019-05-01 | Disposition: A | Discharge status: Home / Self Care | Payer: Medicare HMO | Source: Ambulatory Visit | Attending: Internal Medicine | Admitting: Internal Medicine

## 2019-05-01 DIAGNOSIS — Z20828 Contact with and (suspected) exposure to other viral communicable diseases: Secondary | ICD-10-CM | POA: Insufficient documentation

## 2019-05-01 DIAGNOSIS — Z01812 Encounter for preprocedural laboratory examination: Secondary | ICD-10-CM | POA: Diagnosis not present

## 2019-05-01 LAB — SARS CORONAVIRUS 2 (TAT 6-24 HRS): SARS Coronavirus 2: NEGATIVE

## 2019-05-05 DIAGNOSIS — E1165 Type 2 diabetes mellitus with hyperglycemia: Secondary | ICD-10-CM | POA: Diagnosis not present

## 2019-05-06 ENCOUNTER — Encounter
Admission: RE | Disposition: A | Discharge status: Home / Self Care | Payer: Self-pay | Source: Home / Self Care | Attending: Internal Medicine

## 2019-05-06 ENCOUNTER — Ambulatory Visit: Payer: Medicare HMO | Admitting: Certified Registered Nurse Anesthetist

## 2019-05-06 ENCOUNTER — Other Ambulatory Visit: Payer: Self-pay

## 2019-05-06 ENCOUNTER — Ambulatory Visit
Admission: RE | Admit: 2019-05-06 | Discharge: 2019-05-06 | Disposition: A | Discharge status: Home / Self Care | Payer: Medicare HMO | Attending: Internal Medicine | Admitting: Internal Medicine

## 2019-05-06 DIAGNOSIS — K5730 Diverticulosis of large intestine without perforation or abscess without bleeding: Secondary | ICD-10-CM | POA: Insufficient documentation

## 2019-05-06 DIAGNOSIS — E119 Type 2 diabetes mellitus without complications: Secondary | ICD-10-CM | POA: Insufficient documentation

## 2019-05-06 DIAGNOSIS — Z794 Long term (current) use of insulin: Secondary | ICD-10-CM | POA: Insufficient documentation

## 2019-05-06 DIAGNOSIS — K449 Diaphragmatic hernia without obstruction or gangrene: Secondary | ICD-10-CM | POA: Insufficient documentation

## 2019-05-06 DIAGNOSIS — I2510 Atherosclerotic heart disease of native coronary artery without angina pectoris: Secondary | ICD-10-CM | POA: Insufficient documentation

## 2019-05-06 DIAGNOSIS — Z79899 Other long term (current) drug therapy: Secondary | ICD-10-CM | POA: Insufficient documentation

## 2019-05-06 DIAGNOSIS — K228 Other specified diseases of esophagus: Secondary | ICD-10-CM | POA: Insufficient documentation

## 2019-05-06 DIAGNOSIS — D509 Iron deficiency anemia, unspecified: Secondary | ICD-10-CM | POA: Diagnosis not present

## 2019-05-06 DIAGNOSIS — Z950 Presence of cardiac pacemaker: Secondary | ICD-10-CM | POA: Insufficient documentation

## 2019-05-06 DIAGNOSIS — K648 Other hemorrhoids: Secondary | ICD-10-CM | POA: Diagnosis not present

## 2019-05-06 DIAGNOSIS — K5790 Diverticulosis of intestine, part unspecified, without perforation or abscess without bleeding: Secondary | ICD-10-CM | POA: Diagnosis not present

## 2019-05-06 DIAGNOSIS — K640 First degree hemorrhoids: Secondary | ICD-10-CM | POA: Insufficient documentation

## 2019-05-06 DIAGNOSIS — K210 Gastro-esophageal reflux disease with esophagitis: Secondary | ICD-10-CM | POA: Diagnosis not present

## 2019-05-06 DIAGNOSIS — I110 Hypertensive heart disease with heart failure: Secondary | ICD-10-CM | POA: Diagnosis not present

## 2019-05-06 DIAGNOSIS — I5042 Chronic combined systolic (congestive) and diastolic (congestive) heart failure: Secondary | ICD-10-CM | POA: Diagnosis not present

## 2019-05-06 HISTORY — PX: ESOPHAGOGASTRODUODENOSCOPY (EGD) WITH PROPOFOL: SHX5813

## 2019-05-06 HISTORY — PX: COLONOSCOPY WITH PROPOFOL: SHX5780

## 2019-05-06 LAB — GLUCOSE, CAPILLARY: Glucose-Capillary: 165 mg/dL — ABNORMAL HIGH (ref 70–99)

## 2019-05-06 SURGERY — ESOPHAGOGASTRODUODENOSCOPY (EGD) WITH PROPOFOL
Anesthesia: General

## 2019-05-06 MED ORDER — SODIUM CHLORIDE 0.9 % IV SOLN
INTRAVENOUS | Status: DC
  Administered 2019-05-06: 08:00:00 via INTRAVENOUS

## 2019-05-06 MED ORDER — PROPOFOL 10 MG/ML IV BOLUS
INTRAVENOUS | Status: DC | PRN
  Administered 2019-05-06: 100 mg via INTRAVENOUS

## 2019-05-06 MED ORDER — PROPOFOL 500 MG/50ML IV EMUL
INTRAVENOUS | Status: DC | PRN
  Administered 2019-05-06: 150 ug/kg/min via INTRAVENOUS

## 2019-05-06 MED ORDER — GLYCOPYRROLATE 0.2 MG/ML IJ SOLN
INTRAMUSCULAR | Status: DC | PRN
  Administered 2019-05-06: 0.2 mg via INTRAVENOUS

## 2019-05-06 MED ORDER — MIDAZOLAM HCL 2 MG/2ML IJ SOLN
INTRAMUSCULAR | Status: AC
  Filled 2019-05-06: qty 2

## 2019-05-06 MED ORDER — PROPOFOL 500 MG/50ML IV EMUL
INTRAVENOUS | Status: AC
  Filled 2019-05-06: qty 50

## 2019-05-06 NOTE — Anesthesia Preprocedure Evaluation (Signed)
Anesthesia Evaluation  Patient identified by MRN, date of birth, ID band Patient awake    Reviewed: Allergy & Precautions, H&P , NPO status , Patient's Chart, lab work & pertinent test results, reviewed documented beta blocker date and time   Airway Mallampati: II   Neck ROM: full    Dental  (+) Poor Dentition   Pulmonary shortness of breath,    Pulmonary exam normal        Cardiovascular Exercise Tolerance: Poor hypertension, On Medications + CAD and +CHF  Normal cardiovascular exam+ dysrhythmias  Rhythm:regular Rate:Normal     Neuro/Psych  Neuromuscular disease negative psych ROS   GI/Hepatic negative GI ROS, Neg liver ROS,   Endo/Other  negative endocrine ROSdiabetes  Renal/GU Renal disease  negative genitourinary   Musculoskeletal   Abdominal   Peds  Hematology negative hematology ROS (+)   Anesthesia Other Findings Past Medical History: No date: AV block, Mobitz 2     Comment:  a. s/p Engineer, agricultural PPM in 03/2015 No date: Bell's palsy No date: Bradycardia No date: CAD (coronary artery disease)     Comment:  a. 02/2016 Cath: LM nl, LAD 30p, D1 small w/ severe               ostial/mid dzs, LCX nl, RCA nl, EF <25%, glob HK. No date: Chronic combined systolic and diastolic CHF (congestive  heart failure) ([place])     Comment:  a. Echo in 03/2015: EF 55-60%, Grade 1 DD;  b. 02/2016               Echo: 25-30%, mod LVH, Gr1 DD, mild MR, mildly dil LA,               prominent apical trabeculations. No date: Diabetes ([place]) No date: Hernia of abdominal wall No date: HTN (hypertension) No date: Hypertension No date: NICM (nonischemic cardiomyopathy) ([place])     Comment:  a. 02/2016 relatively non-obstructive cath, EF 25-30% by               echo. Past Surgical History: 03/01/2016: CARDIAC CATHETERIZATION; Bilateral     Comment:  Procedure: Left Heart Cath and Coronary Angiography;    Surgeon: Minna Merritts, MD;  Location: [place]               CV LAB;  Service: Cardiovascular;  Laterality: Bilateral; No date: HERNIA REPAIR No date: INSERT / REPLACE / REMOVE PACEMAKER No date: PACEMAKER INSERTION No date: PACEMAKER INSERTION BMI    Body Mass Index: 27.44 kg/m     Reproductive/Obstetrics negative OB ROS                             Anesthesia Physical Anesthesia Plan  ASA: III  Anesthesia Plan: General   Post-op Pain Management:    Induction:   PONV Risk Score and Plan:   Airway Management Planned:   Additional Equipment:   Intra-op Plan:   Post-operative Plan:   Informed Consent: I have reviewed the patients History and Physical, chart, labs and discussed the procedure including the risks, benefits and alternatives for the proposed anesthesia with the patient or authorized representative who has indicated his/her understanding and acceptance.     Dental Advisory Given  Plan Discussed with: CRNA  Anesthesia Plan Comments:         Anesthesia Quick Evaluation

## 2019-05-06 NOTE — Op Note (Signed)
[facility] Gastro[facility] Patient Name: Bethany Gillespie Procedure Date: 05/06/2019 7:31 AM MRN: [id number] Account #: [id number] Date of Birth: 1951-01-26 Admit Type: Outpatient Age: 68 Room: [facility] ENDO ROOM 3 Gender: Female Note Status: Finalized Procedure:            Colonoscopy Indications:          Iron deficiency anemia Providers:            Benay Pike. [place] MD, MD Medicines:            Propofol per Anesthesia Complications:        No immediate complications. Procedure:            Pre-Anesthesia Assessment:                       - The risks and benefits of the procedure and the                        sedation options and risks were discussed with the                        patient. All questions were answered and informed                        consent was obtained.                       - Patient identification and proposed procedure were                        verified prior to the procedure by the nurse. The                        procedure was verified in the procedure room.                       - ASA Grade Assessment: III - A patient with severe                        systemic disease.                       - After reviewing the risks and benefits, the patient                        was deemed in satisfactory condition to undergo the                        procedure.                       After obtaining informed consent, the colonoscope was                        passed under direct vision. Throughout the procedure,                        the patient's blood pressure, pulse, and oxygen                        saturations were monitored continuously. The  Colonoscope was introduced through the anus and                        advanced to the the cecum, identified by appendiceal                        orifice and ileocecal valve. The colonoscopy was                        performed without difficulty. The patient tolerated the                       procedure well. The quality of the bowel preparation                        was adequate. The ileocecal valve, appendiceal orifice,                        and rectum were photographed. Findings:      The perianal and digital rectal examinations were normal. Pertinent       negatives include normal sphincter tone and no palpable rectal lesions.      A few small-mouthed diverticula were found in the sigmoid colon and       ascending colon.      Non-bleeding internal hemorrhoids were found during retroflexion. The       hemorrhoids were Grade I (internal hemorrhoids that do not prolapse).      The exam was otherwise without abnormality. Impression:           - Diverticulosis in the sigmoid colon and in the                        ascending colon.                       - Non-bleeding internal hemorrhoids.                       - The examination was otherwise normal.                       - No specimens collected. Recommendation:       - Await pathology results from EGD, also performed                        today.                       - Patient has a contact number available for                        emergencies. The signs and symptoms of potential                        delayed complications were discussed with the patient.                        Return to normal activities tomorrow. Written discharge                        instructions were provided to the patient.                       -  Resume previous diet.                       - Continue present medications.                       - Repeat colonoscopy in 10 years for screening purposes.                       - To visualize the small bowel, perform video capsule                        endoscopy at appointment to be scheduled.                       - Return to physician assistant in 6 weeks.                       - The findings and recommendations were discussed with                        the patient.                        - Capsule endoscopy will need to be scheduled at                        tertiary care center [facility] due to dual                        chamber pacemaker. Procedure Code(s):    --- Professional ---                       [phone], Colonoscopy, flexible; diagnostic, including                        collection of specimen(s) by brushing or washing, when                        performed (separate procedure) Diagnosis Code(s):    --- Professional ---                       K57.30, Diverticulosis of large intestine without                        perforation or abscess without bleeding                       D50.9, Iron deficiency anemia, unspecified                       K64.0, First degree hemorrhoids CPT copyright 2019 American Medical Association. All rights reserved. The codes documented in this report are preliminary and upon coder review may  be revised to meet current compliance requirements. Efrain Sella MD, MD 05/06/2019 9:19:29 AM This report has been signed electronically. Number of Addenda: 0 Note Initiated On: 05/06/2019 7:31 AM Scope Withdrawal Time: 0 hours 6 minutes 46 seconds  Total Procedure Duration: 0 hours 15 minutes 13 seconds  Estimated Blood Loss: Estimated blood loss: none.      [facility]

## 2019-05-06 NOTE — Interval H&P Note (Signed)
History and Physical Interval Note:  05/06/2019 8:25 AM  Bethany Gillespie  has presented today for surgery, with the diagnosis of ida.  The various methods of treatment have been discussed with the patient and family. After consideration of risks, benefits and other options for treatment, the patient has consented to  Procedure(s): ESOPHAGOGASTRODUODENOSCOPY (EGD) WITH PROPOFOL (N/A) COLONOSCOPY WITH PROPOFOL (N/A) as a surgical intervention.  The patient's history has been reviewed, patient examined, no change in status, stable for surgery.  I have reviewed the patient's chart and labs.  Questions were answered to the patient's satisfaction.     [place], [place]

## 2019-05-06 NOTE — Transfer of Care (Signed)
Immediate Anesthesia Transfer of Care Note  Patient: Bethany Gillespie  Procedure(s) Performed: Procedure(s): ESOPHAGOGASTRODUODENOSCOPY (EGD) WITH PROPOFOL (N/A) COLONOSCOPY WITH PROPOFOL (N/A)  Patient Location: PACU and Endoscopy Unit  Anesthesia Type:General  Level of Consciousness: sedated  Airway & Oxygen Therapy: Patient Spontanous Breathing and Patient connected to nasal cannula oxygen  Post-op Assessment: Report given to RN and Post -op Vital signs reviewed and stable  Post vital signs: Reviewed and stable  Last Vitals:  Vitals:   05/06/19 0737 05/06/19 0920  BP: (!) 166/82 (!) 99/43  Pulse: 78 70  Resp: 16 (!) 5  Temp: 36.9 C   SpO2: 218% 28%    Complications: No apparent anesthesia complications

## 2019-05-06 NOTE — Anesthesia Post-op Follow-up Note (Signed)
Anesthesia QCDR form completed.        

## 2019-05-06 NOTE — Anesthesia Procedure Notes (Signed)
Date/Time: 05/06/2019 8:28 AM Performed by: Doreen Salvage, CRNA Pre-anesthesia Checklist: Patient identified, Emergency Drugs available, Suction available and Patient being monitored Patient Re-evaluated:Patient Re-evaluated prior to induction Oxygen Delivery Method: Nasal cannula Induction Type: IV induction Dental Injury: Teeth and Oropharynx as per pre-operative assessment  Comments: Nasal cannula with etCO2 monitoring

## 2019-05-06 NOTE — Op Note (Signed)
[facility] Gastroenterology Patient Name: Bethany Gillespie Procedure Date: 05/06/2019 7:31 AM MRN: [id number] Account #: [id number] Date of Birth: Jun 16, 1951 Admit Type: Outpatient Age: 68 Room: [facility] ENDO ROOM 3 Gender: Female Note Status: Finalized Procedure:            Upper GI endoscopy Indications:          Iron deficiency anemia Providers:            Benay Pike. Urijah Raynor MD, MD Medicines:            Propofol per Anesthesia Complications:        No immediate complications. Procedure:            Pre-Anesthesia Assessment:                       - The risks and benefits of the procedure and the                        sedation options and risks were discussed with the                        patient. All questions were answered and informed                        consent was obtained.                       - Patient identification and proposed procedure were                        verified prior to the procedure by the nurse. The                        procedure was verified in the procedure room.                       - ASA Grade Assessment: III - A patient with severe                        systemic disease.                       - After reviewing the risks and benefits, the patient                        was deemed in satisfactory condition to undergo the                        procedure.                       After obtaining informed consent, the endoscope was                        passed under direct vision. Throughout the procedure,                        the patient's blood pressure, pulse, and oxygen                        saturations were monitored continuously. The Endoscope  was introduced through the mouth, and advanced to the                        third part of duodenum. The upper GI endoscopy was                        accomplished without difficulty. The patient tolerated                        the procedure well. Findings:    The Z-line was irregular and was found in the distal esophagus. Mucosa       was biopsied with a cold forceps for histology. One specimen bottle was       sent to pathology. nodular mucosa was also noted      A 2 cm hiatal hernia was present.      The examined duodenum was normal.      The exam was otherwise without abnormality. Impression:           - Z-line irregular, in the distal esophagus. Biopsied.                       - 2 cm hiatal hernia.                       - Normal examined duodenum.                       - The examination was otherwise normal. Recommendation:       - Await pathology results.                       - Proceed with colonoscopy Procedure Code(s):    --- Professional ---                       [phone], Esophagogastroduodenoscopy, flexible, transoral;                        with biopsy, single or multiple Diagnosis Code(s):    --- Professional ---                       D50.9, Iron deficiency anemia, unspecified                       K44.9, Diaphragmatic hernia without obstruction or                        gangrene                       K22.8, Other specified diseases of esophagus CPT copyright 2019 American Medical Association. All rights reserved. The codes documented in this report are preliminary and upon coder review may  be revised to meet current compliance requirements. Efrain Sella MD, MD 05/06/2019 8:57:00 AM This report has been signed electronically. Number of Addenda: 0 Note Initiated On: 05/06/2019 7:31 AM Estimated Blood Loss: Estimated blood loss: none.      [facility]

## 2019-05-06 NOTE — H&P (Signed)
Outpatient short stay form Pre-procedure 05/06/2019 8:24 AM Bethany Gillespie K. Bethany Gillespie, M.D.  Primary Physician: Frazier Richards, M.D.  Reason for visit:  Iron deficiency anemia  History of present illness:  68 y/o female presents for iron deficiency anemia. Patient has mild to moderate constipation but otherwise Patient denies change in bowel habits, rectal bleeding, weight loss or abdominal pain. Patient has no UGI symptoms of N/V, dysphagia or anorexia.      Current Facility-Administered Medications:  .  0.9 %  sodium chloride infusion, , Intravenous, Continuous, [place], Benay Pike, MD, Last Rate: 20 mL/hr at 05/06/19 5009  Medications Prior to Admission  Medication Sig Dispense Refill Last Dose  . carvedilol (COREG) 3.125 MG tablet Take 1 tablet (3.125 mg total) by mouth 2 (two) times daily with a meal. 180 tablet 3 Past Week at Unknown time  . ferrous sulfate 325 (65 FE) MG tablet Take 325 mg by mouth daily with breakfast.   Past Month at Unknown time  . insulin glargine (LANTUS) 100 unit/mL SOPN Inject 0.28 mLs (28 units) into the skin daily (Patient taking differently: 30 Units. Inject 0.28 mLs (28 units) into the skin daily) 15 mL 3 05/05/2019 at Unknown time  . sacubitril-valsartan (ENTRESTO) 24-26 MG Take 1 tablet by mouth daily. 90 tablet 3 Past Week at Unknown time  . amLODipine (NORVASC) 5 MG tablet Take 5 mg by mouth daily.    Not Taking at Unknown time  . blood glucose meter kit and supplies by Other route as directed. Contour next Meter. Dispense based on patient and insurance preference. Use up to four times daily as directed. (FOR ICD-10 E10.9, E11.9).     [name] Continuous Blood Gluc Receiver (FREESTYLE LIBRE 14 DAY READER) DEVI by Does not apply route.     . Continuous Blood Gluc Sensor (FREESTYLE LIBRE 14 DAY SENSOR) MISC by Does not apply route every 14 (fourteen) days.     . Insulin Aspart, w/Niacinamide, (FIASP FLEXTOUCH) 100 UNIT/ML SOPN Inject 8 Units into the skin 4 (four) times  daily.     . Insulin Pen Needle 31G X 6 MM MISC Use 4 times daily 150 each 3      Allergies  Allergen Reactions  . Bee Venom Anaphylaxis     Past Medical History:  Diagnosis Date  . 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 s/p Engineer, agricultural PPM in 03/2015  . Bell's palsy   . Bradycardia   . CAD (coronary artery disease)    a. 02/2016 Cath: LM nl, LAD 30p, D1 small w/ severe ostial/mid dzs, LCX nl, RCA nl, EF <25%, glob HK.  . Chronic combined systolic and diastolic CHF (congestive heart failure) ([place])    a. Echo in 03/2015: EF 55-60%, Grade 1 DD;  b. 02/2016 Echo: 25-30%, mod LVH, Gr1 DD, mild MR, mildly dil LA, prominent apical trabeculations.  . Diabetes ([place])   . Hernia of abdominal wall   . HTN (hypertension)   . Hypertension   . NICM (nonischemic cardiomyopathy) ([place])    a. 02/2016 relatively non-obstructive cath, EF 25-30% by echo.    Review of systems:  Otherwise negative.    Physical Exam  Gen: Alert, oriented. Appears stated age.  HEENT: [place]/AT. PERRLA. Lungs: CTA, no wheezes. CV: RR nl S1, S2. Abd: soft, benign, no masses. BS+ Ext: No edema. Pulses 2+    Planned procedures: Proceed with EGD and colonoscopy. The patient understands the nature of the planned procedure, indications, risks, alternatives and potential  complications including but not limited to bleeding, infection, perforation, damage to internal organs and possible oversedation/side effects from anesthesia. The patient agrees and gives consent to proceed.  Please refer to procedure notes for findings, recommendations and patient disposition/instructions.     Dailin Sosnowski K. Bethany Gillespie, M.D. Gastroenterology 05/06/2019  8:24 AM

## 2019-05-07 ENCOUNTER — Ambulatory Visit (INDEPENDENT_AMBULATORY_CARE_PROVIDER_SITE_OTHER): Payer: Medicare HMO | Admitting: *Deleted

## 2019-05-07 ENCOUNTER — Encounter: Payer: Self-pay | Admitting: Internal Medicine

## 2019-05-07 DIAGNOSIS — I442 Atrioventricular block, complete: Secondary | ICD-10-CM

## 2019-05-07 LAB — SURGICAL PATHOLOGY

## 2019-05-08 LAB — CUP PACEART REMOTE DEVICE CHECK
Battery Remaining Longevity: 108 mo
Battery Remaining Percentage: 100 %
Brady Statistic RA Percent Paced: 1 %
Brady Statistic RV Percent Paced: 100 %
Date Time Interrogation Session: 20200925012200
Implantable Lead Implant Date: 20160810
Implantable Lead Implant Date: 20160810
Implantable Lead Location: 753859
Implantable Lead Location: 753860
Implantable Lead Model: 7740
Implantable Lead Model: 7741
Implantable Lead Serial Number: 640659
Implantable Lead Serial Number: 682646
Implantable Pulse Generator Implant Date: 20160810
Lead Channel Impedance Value: 560 Ohm
Lead Channel Impedance Value: 570 Ohm
Lead Channel Setting Pacing Amplitude: 2 V
Lead Channel Setting Pacing Amplitude: 2.4 V
Lead Channel Setting Pacing Pulse Width: 0.5 ms
Lead Channel Setting Sensing Sensitivity: 2.5 mV
Pulse Gen Serial Number: 722311

## 2019-05-15 ENCOUNTER — Encounter: Payer: Self-pay | Admitting: Cardiology

## 2019-05-15 NOTE — Progress Notes (Signed)
Remote pacemaker transmission.   

## 2019-05-19 NOTE — Anesthesia Postprocedure Evaluation (Signed)
Anesthesia Post Note  Patient: Bethany Gillespie  Procedure(s) Performed: ESOPHAGOGASTRODUODENOSCOPY (EGD) WITH PROPOFOL (N/A ) COLONOSCOPY WITH PROPOFOL (N/A )  Patient location during evaluation: PACU Anesthesia Type: General Level of consciousness: awake and alert Pain management: pain level controlled Vital Signs Assessment: post-procedure vital signs reviewed and stable Respiratory status: spontaneous breathing, nonlabored ventilation, respiratory function stable and patient connected to nasal cannula oxygen Cardiovascular status: blood pressure returned to baseline and stable Postop Assessment: no apparent nausea or vomiting Anesthetic complications: no     Last Vitals:  Vitals:   05/06/19 0940 05/06/19 0950  BP: (!) 119/51 135/66  Pulse: 77 79  Resp: 16 (!) 21  Temp:    SpO2: 97% 98%    Last Pain:  Vitals:   05/06/19 0950  TempSrc:   PainSc: 0-No pain                 Molli Barrows

## 2019-05-20 DIAGNOSIS — Z794 Long term (current) use of insulin: Secondary | ICD-10-CM | POA: Diagnosis not present

## 2019-05-20 DIAGNOSIS — E113293 Type 2 diabetes mellitus with mild nonproliferative diabetic retinopathy without macular edema, bilateral: Secondary | ICD-10-CM | POA: Diagnosis not present

## 2019-05-22 DIAGNOSIS — N184 Chronic kidney disease, stage 4 (severe): Secondary | ICD-10-CM | POA: Diagnosis not present

## 2019-05-22 DIAGNOSIS — I130 Hypertensive heart and chronic kidney disease with heart failure and stage 1 through stage 4 chronic kidney disease, or unspecified chronic kidney disease: Secondary | ICD-10-CM | POA: Diagnosis not present

## 2019-05-22 DIAGNOSIS — E7800 Pure hypercholesterolemia, unspecified: Secondary | ICD-10-CM | POA: Diagnosis not present

## 2019-05-22 DIAGNOSIS — E1165 Type 2 diabetes mellitus with hyperglycemia: Secondary | ICD-10-CM | POA: Diagnosis not present

## 2019-05-22 DIAGNOSIS — E1159 Type 2 diabetes mellitus with other circulatory complications: Secondary | ICD-10-CM | POA: Diagnosis not present

## 2019-05-22 DIAGNOSIS — I5022 Chronic systolic (congestive) heart failure: Secondary | ICD-10-CM | POA: Diagnosis not present

## 2019-05-22 DIAGNOSIS — E1122 Type 2 diabetes mellitus with diabetic chronic kidney disease: Secondary | ICD-10-CM | POA: Diagnosis not present

## 2019-05-27 DIAGNOSIS — E1129 Type 2 diabetes mellitus with other diabetic kidney complication: Secondary | ICD-10-CM | POA: Diagnosis not present

## 2019-05-27 DIAGNOSIS — E1122 Type 2 diabetes mellitus with diabetic chronic kidney disease: Secondary | ICD-10-CM | POA: Diagnosis not present

## 2019-05-27 DIAGNOSIS — E782 Mixed hyperlipidemia: Secondary | ICD-10-CM | POA: Diagnosis not present

## 2019-05-27 DIAGNOSIS — E113293 Type 2 diabetes mellitus with mild nonproliferative diabetic retinopathy without macular edema, bilateral: Secondary | ICD-10-CM | POA: Diagnosis not present

## 2019-05-27 DIAGNOSIS — N184 Chronic kidney disease, stage 4 (severe): Secondary | ICD-10-CM | POA: Diagnosis not present

## 2019-05-27 DIAGNOSIS — E1159 Type 2 diabetes mellitus with other circulatory complications: Secondary | ICD-10-CM | POA: Diagnosis not present

## 2019-05-27 DIAGNOSIS — R809 Proteinuria, unspecified: Secondary | ICD-10-CM | POA: Diagnosis not present

## 2019-05-27 DIAGNOSIS — Z794 Long term (current) use of insulin: Secondary | ICD-10-CM | POA: Diagnosis not present

## 2019-07-28 DIAGNOSIS — E113413 Type 2 diabetes mellitus with severe nonproliferative diabetic retinopathy with macular edema, bilateral: Secondary | ICD-10-CM | POA: Diagnosis not present

## 2019-07-30 DIAGNOSIS — E1165 Type 2 diabetes mellitus with hyperglycemia: Secondary | ICD-10-CM | POA: Diagnosis not present

## 2019-08-03 DIAGNOSIS — R600 Localized edema: Secondary | ICD-10-CM | POA: Diagnosis not present

## 2019-08-03 DIAGNOSIS — R8290 Unspecified abnormal findings in urine: Secondary | ICD-10-CM | POA: Diagnosis not present

## 2019-08-03 DIAGNOSIS — I10 Essential (primary) hypertension: Secondary | ICD-10-CM | POA: Diagnosis not present

## 2019-08-03 DIAGNOSIS — E1122 Type 2 diabetes mellitus with diabetic chronic kidney disease: Secondary | ICD-10-CM | POA: Diagnosis not present

## 2019-08-03 DIAGNOSIS — N184 Chronic kidney disease, stage 4 (severe): Secondary | ICD-10-CM | POA: Diagnosis not present

## 2019-08-03 DIAGNOSIS — N189 Chronic kidney disease, unspecified: Secondary | ICD-10-CM | POA: Diagnosis not present

## 2019-08-04 ENCOUNTER — Ambulatory Visit (INDEPENDENT_AMBULATORY_CARE_PROVIDER_SITE_OTHER): Payer: Medicare HMO | Admitting: Family

## 2019-08-04 ENCOUNTER — Other Ambulatory Visit: Payer: Self-pay

## 2019-08-04 ENCOUNTER — Telehealth: Payer: Self-pay | Admitting: Cardiovascular Disease

## 2019-08-04 ENCOUNTER — Encounter: Payer: Self-pay | Admitting: Family

## 2019-08-04 VITALS — BP 126/60 | HR 66 | Ht 62.0 in | Wt 152.5 lb

## 2019-08-04 DIAGNOSIS — I5022 Chronic systolic (congestive) heart failure: Secondary | ICD-10-CM | POA: Diagnosis not present

## 2019-08-04 DIAGNOSIS — I10 Essential (primary) hypertension: Secondary | ICD-10-CM

## 2019-08-04 DIAGNOSIS — Z950 Presence of cardiac pacemaker: Secondary | ICD-10-CM | POA: Diagnosis not present

## 2019-08-04 DIAGNOSIS — I2510 Atherosclerotic heart disease of native coronary artery without angina pectoris: Secondary | ICD-10-CM | POA: Diagnosis not present

## 2019-08-04 MED ORDER — CARVEDILOL 3.125 MG PO TABS: 3.1250 mg | ORAL_TABLET | Freq: Two times a day (BID) | ORAL | 3 refills | Status: DC

## 2019-08-04 MED ORDER — ENTRESTO 24-26 MG PO TABS: 1.0000 | ORAL_TABLET | Freq: Two times a day (BID) | ORAL | 3 refills | Status: DC

## 2019-08-04 MED ORDER — ENTRESTO 24-26 MG PO TABS: 1.0000 | ORAL_TABLET | Freq: Every day | ORAL | 3 refills | Status: DC

## 2019-08-04 NOTE — Patient Instructions (Signed)
Medication Instructions:  No medication changes.  *If you need a refill on your cardiac medications before your next appointment, please call your pharmacy*  Lab Work: No lab work today.  If you have labs (blood work) drawn today and your tests are completely normal, you will receive your results only by: [name] MyChart Message (if you have MyChart) OR . A paper copy in the mail If you have any lab test that is abnormal or we need to change your treatment, we will call you to review the results.  Testing/Procedures: You had an EKG today.  Follow-Up: At [facility], you and your health needs are our priority.  As part of our continuing mission to provide you with exceptional heart care, we have created designated Provider Care Teams.  These Care Teams include your primary Cardiologist (physician) and Advanced Practice Providers (APPs -  Physician Assistants and Nurse Practitioners) who all work together to provide you with the care you need, when you need it.  Your next appointment:   6 month(s)  The format for your next appointment:   In Person  Provider:    You may see Kathlyn Sacramento, MD or one of the following Advanced Practice Providers on your designated Care Team:    Murray Hodgkins, NP  Christell Faith, PA-C  Marrianne Mood, PA-C   Other Instructions

## 2019-08-04 NOTE — Progress Notes (Signed)
Office Visit    Patient Name: Bethany Gillespie Date of Encounter: 08/04/2019  Primary Care Provider:  Kirk Ruths, MD Primary Cardiologist:  Kathlyn Sacramento, MD Electrophysiologist:  None   Chief Complaint    [name] is a 68 y.o. female with a hx of chronic systolic heart failure secondary to an ICM, nonobstructive coronary artery disease, PPM, DM2, HTN presents today for 52-monthfollow-up of her heart failure.  Past Medical History    Past Medical History:  Diagnosis Date  . 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 s/p BEngineer, agriculturalPPM in 03/2015  . Bell's palsy   . Bradycardia   . CAD (coronary artery disease)    a. 02/2016 Cath: LM nl, LAD 30p, D1 small w/ severe ostial/mid dzs, LCX nl, RCA nl, EF <25%, glob HK.  . Chronic combined systolic and diastolic CH[place] ([place]estive heart [place]) [place]    a. Echo in 03/2015: EF 55-60%, Grade 1 DD;  b. 02/2016 Echo: 25-30%, mod LVH, Gr1 DD, mild MR, mildly dil LA, prominent apical trabeculations.  . [place]   . Hernia of abdominal wall   . HTN (hypertension)   . Hypertension   . [place]    a. 02/2016 relatively non-obstructive cath, EF 25-30% by echo.   Past Surgical History:  Procedure Laterality Date  . CARDIAC CATHETERIZATION Bilateral 03/01/2016   Procedure: Left Heart Cath and Coronary Angiography;  Surgeon: TMinna Merritts MD;  [facility];  Service: Cardiovascular;  Laterality: Bilateral;  . COLONOSCOPY WITH PROPOFOL N/A 05/06/2019   Procedure: COLONOSCOPY WITH PROPOFOL;  Surgeon: Toledo, TBenay Pike MD;  Location: [facility] END[facility]COPY;  Service: Gastroenterology;  Laterality: N/A;  . ESOPHAGOGASTRODUODENOSCOPY (EGD) WITH PROPOFOL N/A 05/06/2019   Procedure: ESOPHAGOGASTRODUODENOSCOPY (EGD) WITH PROPOFOL;  Surgeon: Toledo, TBenay Pike MD;  Location: [facility] ENDOSCOPY;  Service: Gastroenterology;  Laterality: N/A;  . HERNIA REPAIR    . INSERT / REPLACE / REMOVE  PACEMAKER    . PACEMAKER INSERTION    . PACEMAKER INSERTION      Allergies  Allergies  Allergen Reactions  . Bee Venom Anaphylaxis    History of Present Illness    SDamia Bobrowskiis a 68y.o. female with a hx of chronic systolic heart failure secondary to an ICM, nonobstructive CAD, PPM, DM 2, HTN last seen 12/19/2018 by Dr. AFletcher Anon  She also follows with Dr. KCaryl Comesof EP.  PPM placed August 2016 in secondary to second-degree heart block..Marland KitchenHer heart failure was initially diagnosed July 2017 with EF 25 to 30%.  Cardiac catheterization with small vessel coronary artery disease with nonobstructive disease noted in main arteries.  Improvement in EF on medical therapy with echo 02/2017 with EF 50 to 55%.  She has had gradual worsening of renal function with creatinine 1.84 10/2018.  Taken off spironolactone and torsemide and seen by Dr. SCandiss Norse  She subsequently has been restarted on torsemide for some lower extremity edema by Dr. SCandiss Norse  Tells me this has made a significant impact.  She saw him yesterday.  She reports improved energy since her last office visit.  Tells me she has a greater exercise tolerance.  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  She reports no chest pain, pressure, tightness.  She reports no lightheadedness, dizziness, near-syncope, syncope.  She reports her lower extremity edema has improved since restarting torsemide by her her nephrologist.  She reports  no shortness of breath at rest.  She reports her dyspnea on exertion has improved since last office visit.  EKGs/Labs/Other Studies Reviewed:   The following studies were reviewed today: Echo 02/2017 - Left ventricle: The cavity size was normal. There was mild   concentric hypertrophy. Systolic function was normal. The   estimated ejection fraction was in the range of 50% to 55%. Wall   motion was normal; Anterospetal wall motion consistent with   conduction  abnormality. Doppler parameters are consistent with   abnormal left ventricular relaxation (grade 1 diastolic   dysfunction). - Left atrium: The atrium was normal in size. - Right ventricle: Systolic function was normal. - Pulmonary arteries: Systolic pressure was within the normal   range.   Remote device check 05/08/19 Device remote reviewed. Remote is normal.  Battery status is good.  Lead measurements unchanged.  Histograms are appropriate. Battery remaining longevity: 108 mo  EKG:  EKG is ordered today.  The ekg ordered today demonstrates atrial sensed V paced rhythm, PR 214.  No acute ST/T wave changes.  Recent Labs: 10/20/2018: Creatinine, Ser 1.84  Recent Lipid Panel No results found for: CHOL, TRIG, HDL, CHOLHDL, VLDL, LDLCALC, LDLDIRECT  Home Medications   Current Meds  Medication Sig  . amLODipine (NORVASC) 5 MG tablet Take 5 mg by mouth daily.   . blood glucose meter kit and supplies by Other route as directed. Contour next Meter. Dispense based on patient and insurance preference. Use up to four times daily as directed. (FOR ICD-10 E10.9, E11.9).  . carvedilol (COREG) 3.125 MG tablet Take 1 tablet (3.125 mg total) by mouth 2 (two) times daily with a meal.  . Continuous Blood Gluc Receiver (FREESTYLE LIBRE 14 DAY READER) DEVI by Does not apply route.  . Continuous Blood Gluc Sensor (FREESTYLE LIBRE 14 DAY SENSOR) MISC by Does not apply route every 14 (fourteen) days.  . ferrous sulfate 325 (65 FE) MG tablet Take 325 mg by mouth daily with breakfast.  . Insulin Aspart, w/Niacinamide, (FIASP FLEXTOUCH) 100 UNIT/ML SOPN Inject 8 Units into the skin 4 (four) times daily.  . insulin glargine (LANTUS) 100 unit/mL SOPN Inject 0.28 mLs (28 units) into the skin daily (Patient taking differently: 30 Units. Inject 0.28 mLs (28 units) into the skin daily)  . Insulin Pen Needle 31G X 6 MM MISC Use 4 times daily  . pantoprazole (PROTONIX) 20 MG tablet Take 20 mg by mouth daily.   [name]  torsemide (DEMADEX) 20 MG tablet Take 20 mg by mouth daily.   . [DISCONTINUED] carvedilol (COREG) 3.125 MG tablet Take 1 tablet (3.125 mg total) by mouth 2 (two) times daily with a meal.  . [DISCONTINUED] sacubitril-valsartan (ENTRESTO) 24-26 MG Take 1 tablet by mouth daily.  . [DISCONTINUED] sacubitril-valsartan (ENTRESTO) 24-26 MG Take 1 tablet by mouth daily.   Review of Systems     Review of Systems  Constitution: Negative for chills, fever and malaise/fatigue.  Cardiovascular: Positive for intermittent LE edema. Negative for chest pain, dyspnea on exertion, irregular heartbeat, near-syncope and palpitations.  Respiratory: Negative for cough, shortness of breath and wheezing.   Gastrointestinal: Negative for nausea and vomiting.  Neurological: Negative for dizziness, light-headedness and weakness.   All other systems reviewed and are otherwise negative except as noted above.  Physical Exam    VS:  BP 126/60 (BP Location: Left Arm, Patient Position: Sitting, Cuff Size: Normal)   Pulse 66   Ht '5\' 2"'$  (1.575 m)   Wt 152 lb 8  oz (69.2 kg)   BMI 27.89 kg/m  , BMI Body mass index is 27.89 kg/m. GEN: Well nourished, well developed, in no acute distress. HEENT: normal. Neck: Supple, no JVD, carotid bruits, or masses. Cardiac: RRR, no murmurs, rubs, or gallops. No clubbing, cyanosis, edema.  Radials/DP/PT 2+ and equal bilaterally.  Respiratory:  Respirations regular and unlabored, clear to auscultation bilaterally. GI: Soft, nontender, nondistended, BS + x 4. MS: No deformity or atrophy. Skin: Warm and dry, no rash. Neuro:  Strength and sensation are intact. Psych: Normal affect.  Accessory Clinical Findings    ECG personally reviewed by me today - atrial-sensed V-paced with prolonge AV conduction, PR 214 - no acute changes.  Assessment & Plan    1. Chronic systolic heart failure -most recent EF 50 to 55%.  NYHA class II.  Torsemide recently resumed by her nephrologist due to  lower extremity edema.  She is euvolemic on exam today.  She continues to monitor her weight and volume status.  Continue carvedilol and Entresto. 2. HTN -BP well controlled.  Continue present regimen. 3. Second-degree AV block s/p PPM - Continues to follow with device clinic.   4. DM2 -follows with her primary care physician physician.  If additional agent needed consider addition of dapagliflozin for cardioprotective benefit in the setting of heart failure.  Disposition: Follow up in 6 month(s) with Dr. Fletcher Anon or APP   Loel Dubonnet, NP 08/04/2019, 4:42 PM

## 2019-08-04 NOTE — Telephone Encounter (Signed)
Pharmacy calling in to dispute whether Entresto should be once a day or twice. Previously patient has been taking medicine twice a day but new RX states once. Please advise pharmacy on clarification.

## 2019-08-04 NOTE — Telephone Encounter (Signed)
Reviewed with  Vella Raring NP.  PATIENT WAS SEEN TODAY IN OFFICE. Patient stated she only takes once a day .  prescription should be twice aday.   RN SPOKE to pharmacy - corrected  RX given.  pharmacy  Request a new prescription.   e-sent  prescription.

## 2019-08-06 ENCOUNTER — Ambulatory Visit (INDEPENDENT_AMBULATORY_CARE_PROVIDER_SITE_OTHER): Payer: Medicare HMO | Admitting: *Deleted

## 2019-08-06 DIAGNOSIS — I5022 Chronic systolic (congestive) heart failure: Secondary | ICD-10-CM | POA: Diagnosis not present

## 2019-08-10 LAB — CUP PACEART REMOTE DEVICE CHECK
Date Time Interrogation Session: 20201228060812
Implantable Lead Implant Date: 20160810
Implantable Lead Implant Date: 20160810
Implantable Lead Location: 753859
Implantable Lead Location: 753860
Implantable Lead Model: 7740
Implantable Lead Model: 7741
Implantable Lead Serial Number: 640659
Implantable Lead Serial Number: 682646
Implantable Pulse Generator Implant Date: 20160810
Pulse Gen Serial Number: 722311

## 2019-08-20 DIAGNOSIS — R140 Abdominal distension (gaseous): Secondary | ICD-10-CM | POA: Diagnosis not present

## 2019-08-20 DIAGNOSIS — D500 Iron deficiency anemia secondary to blood loss (chronic): Secondary | ICD-10-CM | POA: Diagnosis not present

## 2019-08-20 DIAGNOSIS — K2100 Gastro-esophageal reflux disease with esophagitis, without bleeding: Secondary | ICD-10-CM | POA: Diagnosis not present

## 2019-08-20 DIAGNOSIS — N183 Chronic kidney disease, stage 3 unspecified: Secondary | ICD-10-CM | POA: Diagnosis not present

## 2019-08-20 DIAGNOSIS — K5909 Other constipation: Secondary | ICD-10-CM | POA: Diagnosis not present

## 2019-08-20 DIAGNOSIS — Z950 Presence of cardiac pacemaker: Secondary | ICD-10-CM | POA: Diagnosis not present

## 2019-08-26 DIAGNOSIS — N184 Chronic kidney disease, stage 4 (severe): Secondary | ICD-10-CM | POA: Diagnosis not present

## 2019-09-09 DIAGNOSIS — E1159 Type 2 diabetes mellitus with other circulatory complications: Secondary | ICD-10-CM | POA: Diagnosis not present

## 2019-09-09 DIAGNOSIS — D500 Iron deficiency anemia secondary to blood loss (chronic): Secondary | ICD-10-CM | POA: Diagnosis not present

## 2019-09-16 DIAGNOSIS — N184 Chronic kidney disease, stage 4 (severe): Secondary | ICD-10-CM | POA: Diagnosis not present

## 2019-09-16 DIAGNOSIS — E1122 Type 2 diabetes mellitus with diabetic chronic kidney disease: Secondary | ICD-10-CM | POA: Diagnosis not present

## 2019-09-16 DIAGNOSIS — R809 Proteinuria, unspecified: Secondary | ICD-10-CM | POA: Diagnosis not present

## 2019-09-16 DIAGNOSIS — E113293 Type 2 diabetes mellitus with mild nonproliferative diabetic retinopathy without macular edema, bilateral: Secondary | ICD-10-CM | POA: Diagnosis not present

## 2019-09-16 DIAGNOSIS — E1165 Type 2 diabetes mellitus with hyperglycemia: Secondary | ICD-10-CM | POA: Diagnosis not present

## 2019-09-16 DIAGNOSIS — E1159 Type 2 diabetes mellitus with other circulatory complications: Secondary | ICD-10-CM | POA: Diagnosis not present

## 2019-09-16 DIAGNOSIS — Z794 Long term (current) use of insulin: Secondary | ICD-10-CM | POA: Diagnosis not present

## 2019-09-21 DIAGNOSIS — I110 Hypertensive heart disease with heart failure: Secondary | ICD-10-CM | POA: Diagnosis not present

## 2019-09-21 DIAGNOSIS — M791 Myalgia, unspecified site: Secondary | ICD-10-CM | POA: Diagnosis not present

## 2019-09-21 DIAGNOSIS — N184 Chronic kidney disease, stage 4 (severe): Secondary | ICD-10-CM | POA: Diagnosis not present

## 2019-09-21 DIAGNOSIS — E782 Mixed hyperlipidemia: Secondary | ICD-10-CM | POA: Diagnosis not present

## 2019-09-21 DIAGNOSIS — T466X5A Adverse effect of antihyperlipidemic and antiarteriosclerotic drugs, initial encounter: Secondary | ICD-10-CM | POA: Diagnosis not present

## 2019-09-21 DIAGNOSIS — Z0000 Encounter for general adult medical examination without abnormal findings: Secondary | ICD-10-CM | POA: Diagnosis not present

## 2019-09-21 DIAGNOSIS — I5022 Chronic systolic (congestive) heart failure: Secondary | ICD-10-CM | POA: Diagnosis not present

## 2019-09-21 DIAGNOSIS — Z794 Long term (current) use of insulin: Secondary | ICD-10-CM | POA: Diagnosis not present

## 2019-09-21 DIAGNOSIS — E1122 Type 2 diabetes mellitus with diabetic chronic kidney disease: Secondary | ICD-10-CM | POA: Diagnosis not present

## 2019-09-29 DIAGNOSIS — E1122 Type 2 diabetes mellitus with diabetic chronic kidney disease: Secondary | ICD-10-CM | POA: Diagnosis not present

## 2019-09-29 DIAGNOSIS — N2581 Secondary hyperparathyroidism of renal origin: Secondary | ICD-10-CM | POA: Diagnosis not present

## 2019-09-29 DIAGNOSIS — I10 Essential (primary) hypertension: Secondary | ICD-10-CM | POA: Diagnosis not present

## 2019-09-29 DIAGNOSIS — R600 Localized edema: Secondary | ICD-10-CM | POA: Diagnosis not present

## 2019-09-29 DIAGNOSIS — N189 Chronic kidney disease, unspecified: Secondary | ICD-10-CM | POA: Diagnosis not present

## 2019-09-29 DIAGNOSIS — R809 Proteinuria, unspecified: Secondary | ICD-10-CM | POA: Diagnosis not present

## 2019-09-29 DIAGNOSIS — N184 Chronic kidney disease, stage 4 (severe): Secondary | ICD-10-CM | POA: Diagnosis not present

## 2019-10-19 DIAGNOSIS — E1165 Type 2 diabetes mellitus with hyperglycemia: Secondary | ICD-10-CM | POA: Diagnosis not present

## 2019-10-26 DIAGNOSIS — E113413 Type 2 diabetes mellitus with severe nonproliferative diabetic retinopathy with macular edema, bilateral: Secondary | ICD-10-CM | POA: Diagnosis not present

## 2019-10-26 DIAGNOSIS — H2513 Age-related nuclear cataract, bilateral: Secondary | ICD-10-CM | POA: Diagnosis not present

## 2019-11-05 ENCOUNTER — Ambulatory Visit (INDEPENDENT_AMBULATORY_CARE_PROVIDER_SITE_OTHER): Payer: Medicare HMO | Admitting: *Deleted

## 2019-11-05 DIAGNOSIS — I5022 Chronic systolic (congestive) heart failure: Secondary | ICD-10-CM | POA: Diagnosis not present

## 2019-11-05 LAB — CUP PACEART REMOTE DEVICE CHECK
Battery Remaining Longevity: 102 mo
Battery Remaining Percentage: 100 %
Brady Statistic RA Percent Paced: 2 %
Brady Statistic RV Percent Paced: 100 %
Date Time Interrogation Session: 20210325071600
Implantable Lead Implant Date: 20160810
Implantable Lead Implant Date: 20160810
Implantable Lead Location: 753859
Implantable Lead Location: 753860
Implantable Lead Model: 7740
Implantable Lead Model: 7741
Implantable Lead Serial Number: 640659
Implantable Lead Serial Number: 682646
Implantable Pulse Generator Implant Date: 20160810
Lead Channel Impedance Value: 589 Ohm
Lead Channel Impedance Value: 592 Ohm
Lead Channel Setting Pacing Amplitude: 2 V
Lead Channel Setting Pacing Amplitude: 2.4 V
Lead Channel Setting Pacing Pulse Width: 0.5 ms
Lead Channel Setting Sensing Sensitivity: 2.5 mV
Pulse Gen Serial Number: 722311

## 2019-11-06 NOTE — Progress Notes (Signed)
PPM Remote  

## 2019-11-11 DIAGNOSIS — N184 Chronic kidney disease, stage 4 (severe): Secondary | ICD-10-CM | POA: Diagnosis not present

## 2019-12-16 DIAGNOSIS — N184 Chronic kidney disease, stage 4 (severe): Secondary | ICD-10-CM | POA: Diagnosis not present

## 2019-12-16 DIAGNOSIS — I10 Essential (primary) hypertension: Secondary | ICD-10-CM | POA: Diagnosis not present

## 2019-12-16 DIAGNOSIS — E1122 Type 2 diabetes mellitus with diabetic chronic kidney disease: Secondary | ICD-10-CM | POA: Diagnosis not present

## 2019-12-16 DIAGNOSIS — Z794 Long term (current) use of insulin: Secondary | ICD-10-CM | POA: Diagnosis not present

## 2019-12-16 DIAGNOSIS — I5022 Chronic systolic (congestive) heart failure: Secondary | ICD-10-CM | POA: Diagnosis not present

## 2019-12-16 DIAGNOSIS — E1159 Type 2 diabetes mellitus with other circulatory complications: Secondary | ICD-10-CM | POA: Diagnosis not present

## 2019-12-23 DIAGNOSIS — R809 Proteinuria, unspecified: Secondary | ICD-10-CM | POA: Diagnosis not present

## 2019-12-23 DIAGNOSIS — E113293 Type 2 diabetes mellitus with mild nonproliferative diabetic retinopathy without macular edema, bilateral: Secondary | ICD-10-CM | POA: Diagnosis not present

## 2019-12-23 DIAGNOSIS — E1122 Type 2 diabetes mellitus with diabetic chronic kidney disease: Secondary | ICD-10-CM | POA: Diagnosis not present

## 2019-12-23 DIAGNOSIS — E1129 Type 2 diabetes mellitus with other diabetic kidney complication: Secondary | ICD-10-CM | POA: Diagnosis not present

## 2019-12-23 DIAGNOSIS — N184 Chronic kidney disease, stage 4 (severe): Secondary | ICD-10-CM | POA: Diagnosis not present

## 2019-12-23 DIAGNOSIS — E1159 Type 2 diabetes mellitus with other circulatory complications: Secondary | ICD-10-CM | POA: Diagnosis not present

## 2019-12-23 DIAGNOSIS — Z794 Long term (current) use of insulin: Secondary | ICD-10-CM | POA: Diagnosis not present

## 2019-12-23 DIAGNOSIS — E782 Mixed hyperlipidemia: Secondary | ICD-10-CM | POA: Diagnosis not present

## 2019-12-23 DIAGNOSIS — E1165 Type 2 diabetes mellitus with hyperglycemia: Secondary | ICD-10-CM | POA: Diagnosis not present

## 2020-01-07 DIAGNOSIS — E1165 Type 2 diabetes mellitus with hyperglycemia: Secondary | ICD-10-CM | POA: Diagnosis not present

## 2020-01-13 DIAGNOSIS — N184 Chronic kidney disease, stage 4 (severe): Secondary | ICD-10-CM | POA: Diagnosis not present

## 2020-01-13 DIAGNOSIS — R8290 Unspecified abnormal findings in urine: Secondary | ICD-10-CM | POA: Diagnosis not present

## 2020-01-15 ENCOUNTER — Other Ambulatory Visit: Payer: Self-pay

## 2020-01-15 ENCOUNTER — Ambulatory Visit (INDEPENDENT_AMBULATORY_CARE_PROVIDER_SITE_OTHER): Payer: Medicare HMO | Admitting: Physician Assistant

## 2020-01-15 ENCOUNTER — Encounter: Payer: Self-pay | Admitting: Physician Assistant

## 2020-01-15 ENCOUNTER — Ambulatory Visit
Admission: RE | Admit: 2020-01-15 | Discharge: 2020-01-15 | Disposition: A | Discharge status: Home / Self Care | Payer: Medicare HMO | Source: Ambulatory Visit | Attending: Physician Assistant | Admitting: Physician Assistant

## 2020-01-15 VITALS — BP 124/68 | HR 81 | Ht 62.0 in | Wt 152.4 lb

## 2020-01-15 DIAGNOSIS — N281 Cyst of kidney, acquired: Secondary | ICD-10-CM

## 2020-01-15 DIAGNOSIS — E1129 Type 2 diabetes mellitus with other diabetic kidney complication: Secondary | ICD-10-CM | POA: Diagnosis not present

## 2020-01-15 DIAGNOSIS — I10 Essential (primary) hypertension: Secondary | ICD-10-CM

## 2020-01-15 DIAGNOSIS — R0601 Orthopnea: Secondary | ICD-10-CM

## 2020-01-15 DIAGNOSIS — T829XXA Unspecified complication of cardiac and vascular prosthetic device, implant and graft, initial encounter: Secondary | ICD-10-CM | POA: Insufficient documentation

## 2020-01-15 DIAGNOSIS — I441 Atrioventricular block, second degree: Secondary | ICD-10-CM | POA: Diagnosis not present

## 2020-01-15 DIAGNOSIS — I2510 Atherosclerotic heart disease of native coronary artery without angina pectoris: Secondary | ICD-10-CM | POA: Diagnosis not present

## 2020-01-15 DIAGNOSIS — I5022 Chronic systolic (congestive) heart failure: Secondary | ICD-10-CM

## 2020-01-15 DIAGNOSIS — Z950 Presence of cardiac pacemaker: Secondary | ICD-10-CM

## 2020-01-15 DIAGNOSIS — Z794 Long term (current) use of insulin: Secondary | ICD-10-CM

## 2020-01-15 DIAGNOSIS — E278 Other specified disorders of adrenal gland: Secondary | ICD-10-CM

## 2020-01-15 NOTE — Progress Notes (Signed)
Office Visit    Patient Name: Seleny Allbright Date of Encounter: 01/15/2020  Primary Care Provider:  Kirk Ruths, MD Primary Cardiologist:  Kathlyn Sacramento, MD  Chief Complaint    Chief Complaint  Patient presents with  . office visit    6 month F/U-patient is concerned about the positiopn change of her pacemaker in her chest; Meds verbally reviewed with patient.    69 year old female with history of chronic systolic heart failure secondary to ICM, nonobstructive coronary artery disease, PPM, DM 2, and hypertension, and who presents for follow-up of her heart failure and report of change in position of PPM.  Past Medical History    Past Medical History:  Diagnosis Date  . 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 s/p Engineer, agricultural PPM in 03/2015  . Bell's palsy   . Bradycardia   . CAD (coronary artery disease)    a. 02/2016 Cath: LM nl, LAD 30p, D1 small w/ severe ostial/mid dzs, LCX nl, RCA nl, EF <25%, glob HK.  . Chronic combined systolic and diastolic CHF (congestive heart failure) ([place])    a. Echo in 03/2015: EF 55-60%, Grade 1 DD;  b. 02/2016 Echo: 25-30%, mod LVH, Gr1 DD, mild MR, mildly dil LA, prominent apical trabeculations.  . Diabetes ([place])   . Hernia of abdominal wall   . HTN (hypertension)   . Hypertension   . NICM (nonischemic cardiomyopathy) ([place])    a. 02/2016 relatively non-obstructive cath, EF 25-30% by echo.   Past Surgical History:  Procedure Laterality Date  . CARDIAC CATHETERIZATION Bilateral 03/01/2016   Procedure: Left Heart Cath and Coronary Angiography;  Surgeon: Minna Merritts, MD;  Location: [place] [facility];  Service: Cardiovascular;  Laterality: Bilateral;  . COLONOSCOPY WITH PROPOFOL N/A 05/06/2019   Procedure: COLONOSCOPY WITH PROPOFOL;  Surgeon: Toledo, Benay Pike, MD;  Location: [facility];  Service: Gastroenterology;  Laterality: N/A;  . ESOPHAGOGASTRODUODENOSCOPY (EGD) WITH PROPOFOL N/A 05/06/2019   Procedure:  ESOPHAGOGASTRODUODENOSCOPY (EGD) WITH PROPOFOL;  Surgeon: Toledo, Benay Pike, MD;  Location: [facility];  Service: Gastroenterology;  Laterality: N/A;  . HERNIA REPAIR    . INSERT / REPLACE / REMOVE PACEMAKER    . PACEMAKER INSERTION    . PACEMAKER INSERTION      Allergies  Allergies  Allergen Reactions  . Bee Venom Anaphylaxis  . Atorvastatin Other (See Comments)    Severe leg cramping    History of Present Illness    Heavenlee Maiorana is a 69 y.o. female with PMH as above.  She is followed by both Dr. Fletcher Anon and Dr. Caryl Comes of EP.  PPM placed August 2016 2/2 second-degree heart block.  Her heart failure was initially diagnosed 02/2016 with EF 25 to 30%.  LHC showed small vessel CAD with nonobstructive disease noted in main arteries.  With medical therapy, improvement in EF was noted with echo 02/2017 and EF 50 to 55%.  She had gradual worsening of renal function and was taken off spironolactone and torsemide and seen by Dr. Candiss Norse.  Torsemide was subsequently been restarted for lower extremity edema per [facility], and with patient reporting this made a significant impact on her edema.  At her last office visit, she reported improvement in energy and greater exercise tolerance.  Her DOE had improved since her last visit. No changes were made.  Today, she returns to clinic and notes that she feels her pacemaker has been moving.  She notes that she felt her pacemaker move  one night while sleeping.  It awoke her from sleep.  It was described as a L sided sharp and stabbing pain, after which time she felt as if her pacemaker had shifted in position.  In addition, she has noted intermittent heaviness in her chest, which occurs when she lays down to sleep.  In addition, she notes that she will occasionally get up after feeling this heaviness and coughed up some clear phlegm, after which time she is able to lay down flat without any issue.  She has been monitoring her blood pressure at home with home SBPs 130s  and DBPs 60-70s. She denies palpitations, n, v, dizziness, syncope, weight gain, or early satiety. She reports ongoing improvement in her DOE and LEE with the torsemide. She continues to follow with nephrology. She denies any s/sx of bleeding and reports medication compliance.  Home Medications    Prior to Admission medications   Medication Sig Start Date End Date Taking? Authorizing Provider  amLODipine (NORVASC) 5 MG tablet Take 5 mg by mouth daily.  09/05/18  Yes [provider]  blood glucose meter kit and supplies by Other route as directed. Contour next Meter. Dispense based on patient and insurance preference. Use up to four times daily as directed. (FOR ICD-10 E10.9, E11.9).   Yes [provider]  carvedilol (COREG) 3.125 MG tablet Take 1 tablet (3.125 mg total) by mouth 2 (two) times daily with a meal. 08/04/19  Yes Loel Dubonnet, NP  Continuous Blood Gluc Receiver (FREESTYLE LIBRE 14 DAY READER) DEVI by Does not apply route.   Yes [provider]  Continuous Blood Gluc Sensor (FREESTYLE LIBRE 14 DAY SENSOR) MISC by Does not apply route every 14 (fourteen) days.   Yes [provider]  ferrous sulfate 325 (65 FE) MG tablet Take 325 mg by mouth daily with breakfast.   Yes [provider]  insulin aspart (NOVOLOG) 100 UNIT/ML FlexPen Inject 12 Units into the skin as directed. 3-4 times daily prior to meals 12/24/19  Yes [provider]  insulin glargine (LANTUS) 100 unit/mL SOPN Inject 0.28 mLs (28 units) into the skin daily Patient taking differently: 30 Units. Inject 0.28 mLs (28 units) into the skin daily 06/13/18  Yes Shamleffer, Melanie Crazier, MD  Insulin Pen Needle 31G X 6 MM MISC Use 4 times daily 06/13/18  Yes Shamleffer, Melanie Crazier, MD  pantoprazole (PROTONIX) 20 MG tablet Take 20 mg by mouth daily.  05/26/19  Yes [provider]  sacubitril-valsartan (ENTRESTO) 24-26 MG Take 1 tablet by mouth 2 (two) times daily.  08/04/19  Yes Loel Dubonnet, NP  torsemide (DEMADEX) 20 MG tablet Take 20 mg by mouth daily.  03/25/19  Yes [provider]  Insulin Aspart, w/Niacinamide, (FIASP FLEXTOUCH) 100 UNIT/ML SOPN Inject 8 Units into the skin 4 (four) times daily.    [provider]    Review of Systems    She notes a L sided sharp and stabbing pain, after which time she felt as if her pacemaker had shifted in position. She reports intermittent heaviness in her chest, which occurs when she lays down to sleep, and which improves after coughing up phelgm. She denies palpitations, n, v, dizziness, syncope, weight gain, or early satiety. She reports ongoing improvement in her DOE and LEE with the torsemide.   All other systems reviewed and are otherwise negative except as noted above.  Physical Exam    VS:  BP 124/68 (BP Location: Right Arm, Patient Position:  Sitting, Cuff Size: Normal)   Pulse 81   Ht '5\' 2"'$  (1.575 m)   Wt 152 lb 6 oz (69.1 kg)   SpO2 95%   BMI 27.87 kg/m  , BMI Body mass index is 27.87 kg/m. GEN: Well nourished, well developed, in no acute distress. HEENT: normal. Neck: Supple, no JVD, carotid bruits, or masses. Cardiac: RRR, PPM not TTP, no murmurs, rubs, or gallops. No clubbing, cyanosis, edema.  Radials/DP/PT 2+ and equal bilaterally.  Respiratory:  Respirations regular and unlabored, trace bibasilar crackles GI: Firm, nontender, slightly distendistended, BS + x 4. MS: mild bilateral LEE worse on L than R; no deformity or atrophy. Skin: warm and dry, no rash. Neuro:  Strength and sensation are intact. Psych: Normal affect.  Accessory Clinical Findings    ECG personally reviewed by me today - 81bpm, atrial sensed ventricular paced, LVH - no acute changes.  VITALS Reviewed today   Temp Readings from Last 3 Encounters:  05/06/19 98.4 F (36.9 C) (Oral)  12/27/16 98.3 F (36.8 C) (Oral)  03/01/16 98 F (36.7 C) (Oral)   BP Readings from Last 3 Encounters:    01/15/20 124/68  08/04/19 126/60  05/06/19 135/66   Pulse Readings from Last 3 Encounters:  01/15/20 81  08/04/19 66  05/06/19 79    Wt Readings from Last 3 Encounters:  01/15/20 152 lb 6 oz (69.1 kg)  08/04/19 152 lb 8 oz (69.2 kg)  05/06/19 150 lb (68 kg)     LABS  reviewed today   Lab Results  Component Value Date   WBC 8.7 12/27/2016   HGB 12.9 12/27/2016   HCT 38.4 12/27/2016   MCV 83.2 12/27/2016   PLT 191 12/27/2016   Lab Results  Component Value Date   CREATININE 1.84 (H) 10/20/2018   BUN 26 (H) 12/27/2016   NA 131 (L) 12/27/2016   K 4.2 12/27/2016   CL 98 (L) 12/27/2016   CO2 24 12/27/2016   Lab Results  Component Value Date   ALT 121 (H) 02/28/2014   AST 122 (H) 02/28/2014   ALKPHOS 48 02/28/2014   BILITOT 0.8 02/28/2014   No results found for: CHOL, HDL, LDLCALC, LDLDIRECT, TRIG, CHOLHDL  Lab Results  Component Value Date   HGBA1C 12.9 (H) 02/28/2016   No results found for: TSH   STUDIES/PROCEDURES reviewed today   Echo 02/2017 - Left ventricle: The cavity size was normal. There was mild concentric hypertrophy. Systolic function was normal. The estimated ejection fraction was in the range of 50% to 55%. Wall motion was normal; Anterospetal wall motion consistent with conduction abnormality. Doppler parameters are consistent with abnormal left ventricular relaxation (grade 1 diastolic dysfunction). - Left atrium: The atrium was normal in size. - Right ventricle: Systolic function was normal. - Pulmonary arteries: Systolic pressure was within the normal range.  Last device check 11/05/19 with battery status good, lead measurements unchanged, histograms appropriate.   Assessment & Plan     HFrEF --Echo as above with EF 50-55% and G1DD. She reports intermittent chest heaviness when laying flat, though alleviated by coughing up clear phlegm. She notes intermittent LEE that has been alleviated by resumption of torsemide by  nephrology. Given her sx as described above, as well as trace crackles on exam, will repeat an echo. Of note, her weight is unchanged from that of previous visits; therefore, obtaining an echo will be helpful in providing more information. Continue current BB and Entresto.  HTN --BP well controlled. Continue current medications.  Second degree AVB s/p PPM --Reports she feels her PPM has shifted in position. Will obtain a CXR today and refer her to EP for further evaluation. Device did not move with palpation today. Patient states device is moving toward her armpit. Further recommendations pending imaging and per EP.   DM2 --Per PCP.  Strict glycemic control recommended for cardiorenal health.    Studies / Imaging ordered: CXR (PPM shifted) Disposition: RTC 3 months    Arvil Chaco, PA-C 01/15/2020

## 2020-01-15 NOTE — Patient Instructions (Addendum)
Medication Instructions:  Your physician recommends that you continue on your current medications as directed. Please refer to the Current Medication list given to you today.   *If you need a refill on your cardiac medications before your next appointment, please call your pharmacy*   Lab Work: None ordered If you have labs (blood work) drawn today and your tests are completely normal, you will receive your results only by: [name] MyChart Message (if you have MyChart) OR . A paper copy in the mail If you have any lab test that is abnormal or we need to change your treatment, we will call you to review the results.   Testing/Procedures: A chest x-ray takes a picture of the organs and structures inside the chest, including the heart, lungs, and blood vessels. This test can show several things, including, whether the heart is enlarges; whether fluid is building up in the lungs; and whether pacemaker / defibrillator leads are still in place. (Please have this done today at the medical mall)  Your physician has requested that you have an echocardiogram. Echocardiography is a painless test that uses sound waves to create images of your heart. It provides your doctor with information about the size and shape of your heart and how well your heart's chambers and valves are working. This procedure takes approximately one hour. There are no restrictions for this procedure.     Follow-Up: At [facility], you and your health needs are our priority.  As part of our continuing mission to provide you with exceptional heart care, we have created designated Provider Care Teams.  These Care Teams include your primary Cardiologist (physician) and Advanced Practice Providers (APPs -  Physician Assistants and Nurse Practitioners) who all work together to provide you with the care you need, when you need it.  We recommend signing up for the patient portal called "MyChart".  Sign up information is provided on this  After Visit Summary.  MyChart is used to connect with patients for Virtual Visits (Telemedicine).  Patients are able to view lab/test results, encounter notes, upcoming appointments, etc.  Non-urgent messages can be sent to your provider as well.   To learn more about what you can do with MyChart, go to [email or web address].    Your next appointment:   3 month(s)  The format for your next appointment:   In Person  Provider:    You may see Kathlyn Sacramento, MD or one of the following Advanced Practice Providers on your designated Care Team:    Murray Hodgkins, NP  Christell Faith, PA-C  Marrianne Mood, PA-C    Other Instructions N/A

## 2020-01-19 ENCOUNTER — Ambulatory Visit (INDEPENDENT_AMBULATORY_CARE_PROVIDER_SITE_OTHER): Payer: Medicare HMO

## 2020-01-19 ENCOUNTER — Other Ambulatory Visit: Payer: Self-pay

## 2020-01-19 DIAGNOSIS — I5022 Chronic systolic (congestive) heart failure: Secondary | ICD-10-CM | POA: Diagnosis not present

## 2020-01-19 DIAGNOSIS — R0601 Orthopnea: Secondary | ICD-10-CM

## 2020-01-20 ENCOUNTER — Telehealth: Payer: Self-pay | Admitting: *Deleted

## 2020-01-20 DIAGNOSIS — I10 Essential (primary) hypertension: Secondary | ICD-10-CM | POA: Diagnosis not present

## 2020-01-20 DIAGNOSIS — N2581 Secondary hyperparathyroidism of renal origin: Secondary | ICD-10-CM | POA: Diagnosis not present

## 2020-01-20 DIAGNOSIS — R600 Localized edema: Secondary | ICD-10-CM | POA: Diagnosis not present

## 2020-01-20 DIAGNOSIS — N184 Chronic kidney disease, stage 4 (severe): Secondary | ICD-10-CM | POA: Diagnosis not present

## 2020-01-20 DIAGNOSIS — N189 Chronic kidney disease, unspecified: Secondary | ICD-10-CM | POA: Diagnosis not present

## 2020-01-20 DIAGNOSIS — E1122 Type 2 diabetes mellitus with diabetic chronic kidney disease: Secondary | ICD-10-CM | POA: Diagnosis not present

## 2020-01-20 DIAGNOSIS — R809 Proteinuria, unspecified: Secondary | ICD-10-CM | POA: Diagnosis not present

## 2020-01-20 NOTE — Telephone Encounter (Signed)
-----   Message from Arvil Chaco, PA-C sent at 01/20/2020 11:18 AM EDT ----- Please let Bethany Gillespie know that her pump function of her heart is low normal, which is consistent with her previous 2018 study. She has moderate thickening of the walls of the left bottom chamber of the heart and slightly stiff heart (seen on last study), which can happen with elevated pressure over time. The right bottom chamber of the heart is mildly enlarged with a mild increase in the thickness of its walls. We will keep an eye on this for her. The top left chamber of the heart is mildly enlarged as well.   It does seem as if she might be holding on to some fluid both by echo and with her exam.   We do not have a recent BMET for her. If possible, let's have her come in for a BMET today. If this is stable, we can increase her torsemide for 2-3 days and then drop down to her usual dose and see if this helps improve her volume status. We will hold off on any medication changes until we check her kidneys, however.

## 2020-01-20 NOTE — Telephone Encounter (Signed)
First time I called patient she answered. I began giving results and then realized we had been disconnected.  Dialed patient's number again and patient did not answer. Phone went to VM and VM was full. Will have to try again later.

## 2020-01-20 NOTE — Telephone Encounter (Signed)
Message sent to Dr. Candiss Norse regarding sending lab data. Will await his response.

## 2020-01-20 NOTE — Telephone Encounter (Signed)
Spoke with the patient. Patient made aware of echo results and Marrianne Mood, PA recommendation. Patient sts that she has has recent lab work on 01/13/20 ordered by Dr. Candiss Norse her nephrologist.  Adv the patient that I was able to locate her recent lab results in care everywhere. I will fwd the msg to JV to let her know that they are available for review in the patients chart.  Patient verbalized undersstanding.

## 2020-01-20 NOTE — Telephone Encounter (Signed)
Patient calling to discuss recent testing results  ° °Please call  ° °

## 2020-01-28 NOTE — Telephone Encounter (Signed)
Hi Jacquelyn,  I am checking to see if you've heard back from Dr Candiss Norse.  Thanks, Meyer Cory, RN

## 2020-02-02 DIAGNOSIS — E113413 Type 2 diabetes mellitus with severe nonproliferative diabetic retinopathy with macular edema, bilateral: Secondary | ICD-10-CM | POA: Diagnosis not present

## 2020-02-04 ENCOUNTER — Ambulatory Visit (INDEPENDENT_AMBULATORY_CARE_PROVIDER_SITE_OTHER): Payer: Medicare HMO | Admitting: *Deleted

## 2020-02-04 DIAGNOSIS — I441 Atrioventricular block, second degree: Secondary | ICD-10-CM

## 2020-02-04 LAB — CUP PACEART REMOTE DEVICE CHECK
Battery Remaining Longevity: 96 mo
Battery Remaining Percentage: 95 %
Brady Statistic RA Percent Paced: 2 %
Brady Statistic RV Percent Paced: 100 %
Date Time Interrogation Session: 20210624061800
Implantable Lead Implant Date: 20160810
Implantable Lead Implant Date: 20160810
Implantable Lead Location: 753859
Implantable Lead Location: 753860
Implantable Lead Model: 7740
Implantable Lead Model: 7741
Implantable Lead Serial Number: 640659
Implantable Lead Serial Number: 682646
Implantable Pulse Generator Implant Date: 20160810
Lead Channel Impedance Value: 556 Ohm
Lead Channel Impedance Value: 649 Ohm
Lead Channel Setting Pacing Amplitude: 2 V
Lead Channel Setting Pacing Amplitude: 2.4 V
Lead Channel Setting Pacing Pulse Width: 0.5 ms
Lead Channel Setting Sensing Sensitivity: 2.5 mV
Pulse Gen Serial Number: 722311

## 2020-02-08 NOTE — Progress Notes (Signed)
Remote pacemaker transmission.   

## 2020-02-08 NOTE — Telephone Encounter (Signed)
Update. Dr. Candiss Norse is on vacation and should be returning this week.

## 2020-02-17 NOTE — Telephone Encounter (Signed)
Labs from her nephrologist were obtained and posted the last seek of June, which is now a month since I've seen the patient. We can reassess at follow-up. Nothing further is needed at this time.

## 2020-02-23 DIAGNOSIS — E1159 Type 2 diabetes mellitus with other circulatory complications: Secondary | ICD-10-CM | POA: Diagnosis not present

## 2020-02-23 DIAGNOSIS — Z794 Long term (current) use of insulin: Secondary | ICD-10-CM | POA: Diagnosis not present

## 2020-02-23 DIAGNOSIS — E1129 Type 2 diabetes mellitus with other diabetic kidney complication: Secondary | ICD-10-CM | POA: Diagnosis not present

## 2020-02-23 DIAGNOSIS — R809 Proteinuria, unspecified: Secondary | ICD-10-CM | POA: Diagnosis not present

## 2020-02-23 DIAGNOSIS — E1122 Type 2 diabetes mellitus with diabetic chronic kidney disease: Secondary | ICD-10-CM | POA: Diagnosis not present

## 2020-02-23 DIAGNOSIS — E113293 Type 2 diabetes mellitus with mild nonproliferative diabetic retinopathy without macular edema, bilateral: Secondary | ICD-10-CM | POA: Diagnosis not present

## 2020-02-23 DIAGNOSIS — N184 Chronic kidney disease, stage 4 (severe): Secondary | ICD-10-CM | POA: Diagnosis not present

## 2020-02-23 DIAGNOSIS — E1165 Type 2 diabetes mellitus with hyperglycemia: Secondary | ICD-10-CM | POA: Diagnosis not present

## 2020-03-21 DIAGNOSIS — N184 Chronic kidney disease, stage 4 (severe): Secondary | ICD-10-CM | POA: Diagnosis not present

## 2020-03-21 DIAGNOSIS — Z794 Long term (current) use of insulin: Secondary | ICD-10-CM | POA: Diagnosis not present

## 2020-03-21 DIAGNOSIS — T466X5A Adverse effect of antihyperlipidemic and antiarteriosclerotic drugs, initial encounter: Secondary | ICD-10-CM | POA: Diagnosis not present

## 2020-03-21 DIAGNOSIS — M791 Myalgia, unspecified site: Secondary | ICD-10-CM | POA: Diagnosis not present

## 2020-03-21 DIAGNOSIS — E1122 Type 2 diabetes mellitus with diabetic chronic kidney disease: Secondary | ICD-10-CM | POA: Diagnosis not present

## 2020-03-21 DIAGNOSIS — E1159 Type 2 diabetes mellitus with other circulatory complications: Secondary | ICD-10-CM | POA: Diagnosis not present

## 2020-03-21 DIAGNOSIS — Z1231 Encounter for screening mammogram for malignant neoplasm of breast: Secondary | ICD-10-CM | POA: Diagnosis not present

## 2020-03-21 DIAGNOSIS — I5022 Chronic systolic (congestive) heart failure: Secondary | ICD-10-CM | POA: Diagnosis not present

## 2020-03-28 DIAGNOSIS — E113413 Type 2 diabetes mellitus with severe nonproliferative diabetic retinopathy with macular edema, bilateral: Secondary | ICD-10-CM | POA: Diagnosis not present

## 2020-03-29 ENCOUNTER — Ambulatory Visit (INDEPENDENT_AMBULATORY_CARE_PROVIDER_SITE_OTHER): Payer: Medicare HMO | Admitting: Internal Medicine

## 2020-03-29 ENCOUNTER — Other Ambulatory Visit: Payer: Self-pay

## 2020-03-29 ENCOUNTER — Encounter: Payer: Self-pay | Admitting: Internal Medicine

## 2020-03-29 VITALS — BP 126/66 | HR 60 | Ht 62.0 in | Wt 148.0 lb

## 2020-03-29 DIAGNOSIS — I5022 Chronic systolic (congestive) heart failure: Secondary | ICD-10-CM | POA: Diagnosis not present

## 2020-03-29 DIAGNOSIS — I441 Atrioventricular block, second degree: Secondary | ICD-10-CM | POA: Diagnosis not present

## 2020-03-29 DIAGNOSIS — Z950 Presence of cardiac pacemaker: Secondary | ICD-10-CM

## 2020-03-29 DIAGNOSIS — I10 Essential (primary) hypertension: Secondary | ICD-10-CM | POA: Diagnosis not present

## 2020-03-29 NOTE — Progress Notes (Signed)
.      Patient Care Team: Kirk Ruths, MD as PCP - General (Internal Medicine) Wellington Hampshire, MD as PCP - Cardiology (Cardiology) Alisa Graff, FNP as Nurse Practitioner (Family Medicine) Judi Cong, MD as Physician Assistant (Internal Medicine)   HPI  Bethany Gillespie is a 69 y.o. female Seen in follow-up for ischemic heart disease modest left ventricular dysfunction and a previously implanted pacemaker  2016 at [place] following  Syncope with Mobitz 2 heart block.   She has hx of CAD- small vessel with some degree of  LV dysfunction DATE TEST EF   8/16  Echo   55-60 %   7/17 Cath   25 % Small vessel D1  9/17 Echo  40-45%   6/21 Echo  50-55%    Date Cr K hGB Mg  5/18  1.04 4.2    3/20  1.84     6/21 1.92 3.9 9.7    The patient denies chest pain, shortness of breath, nocturnal dyspnea, orthopnea or peripheral edema.  There have been no palpitations, lightheadedness or syncope.    Concerned about device migration   VACCINATED    Past Medical History:  Diagnosis Date  . 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 s/p Engineer, agricultural PPM in 03/2015  . Bell's palsy   . Bradycardia   . CAD (coronary artery disease)    a. 02/2016 Cath: LM nl, LAD 30p, D1 small w/ severe ostial/mid dzs, LCX nl, RCA nl, EF <25%, glob HK.  . Chronic combined systolic and diastolic CHF (congestive heart failure) ([place])    a. Echo in 03/2015: EF 55-60%, Grade 1 DD;  b. 02/2016 Echo: 25-30%, mod LVH, Gr1 DD, mild MR, mildly dil LA, prominent apical trabeculations.  . Diabetes ([place])   . Hernia of abdominal wall   . HTN (hypertension)   . Hypertension   . NICM (nonischemic cardiomyopathy) ([place])    a. 02/2016 relatively non-obstructive cath, EF 25-30% by echo.    Past Surgical History:  Procedure Laterality Date  . CARDIAC CATHETERIZATION Bilateral 03/01/2016   Procedure: Left Heart Cath and Coronary Angiography;  Surgeon: Minna Merritts, MD;  Location: [place] [facility];   Service: Cardiovascular;  Laterality: Bilateral;  . COLONOSCOPY WITH PROPOFOL N/A 05/06/2019   Procedure: COLONOSCOPY WITH PROPOFOL;  Surgeon: Toledo, Benay Pike, MD;  Location: [facility];  Service: Gastroenterology;  Laterality: N/A;  . ESOPHAGOGASTRODUODENOSCOPY (EGD) WITH PROPOFOL N/A 05/06/2019   Procedure: ESOPHAGOGASTRODUODENOSCOPY (EGD) WITH PROPOFOL;  Surgeon: Toledo, Benay Pike, MD;  Location: [facility];  Service: Gastroenterology;  Laterality: N/A;  . HERNIA REPAIR    . INSERT / REPLACE / REMOVE PACEMAKER    . PACEMAKER INSERTION    . PACEMAKER INSERTION      Current Outpatient Medications  Medication Sig Dispense Refill  . amLODipine (NORVASC) 5 MG tablet Take 5 mg by mouth daily.     . blood glucose meter kit and supplies by Other route as directed. Contour next Meter. Dispense based on patient and insurance preference. Use up to four times daily as directed. (FOR ICD-10 E10.9, E11.9).    . carvedilol (COREG) 3.125 MG tablet Take 1 tablet (3.125 mg total) by mouth 2 (two) times daily with a meal. 180 tablet 3  . Continuous Blood Gluc Receiver (FREESTYLE LIBRE 14 DAY READER) DEVI by Does not apply route.    . Continuous Blood Gluc Sensor (FREESTYLE LIBRE 14 DAY SENSOR) MISC by Does not apply  route every 14 (fourteen) days.    . ferrous sulfate 325 (65 FE) MG tablet Take 325 mg by mouth daily with breakfast.    . insulin aspart (NOVOLOG) 100 UNIT/ML FlexPen Inject 12 Units into the skin as directed. 3-4 times daily prior to meals    . Insulin Aspart, w/Niacinamide, (FIASP FLEXTOUCH) 100 UNIT/ML SOPN Inject 8 Units into the skin 4 (four) times daily.    . insulin glargine (LANTUS) 100 unit/mL SOPN Inject 0.28 mLs (28 units) into the skin daily (Patient taking differently: 30 Units. Inject 0.28 mLs (28 units) into the skin daily) 15 mL 3  . Insulin Pen Needle 31G X 6 MM MISC Use 4 times daily 150 each 3  . sacubitril-valsartan (ENTRESTO) 24-26 MG Take 1 tablet by mouth 2 (two)  times daily. 180 tablet 3  . torsemide (DEMADEX) 20 MG tablet Take 20 mg by mouth daily.      No current facility-administered medications for this visit.    Allergies  Allergen Reactions  . Bee Venom Anaphylaxis  . Atorvastatin Other (See Comments)    Severe leg cramping      Review of Systems negative except from HPI and PMH  Physical Exam BP 126/66 (BP Location: Left Arm, Patient Position: Sitting, Cuff Size: Normal)   Pulse 60   Ht _0  (1.575 m)   Wt 148 lb (67.1 kg)   SpO2 96%   BMI 27.07 kg/m  Well developed and well nourished in no acute distress HENT normal Neck supple with JVP-flat Clear Device pocket well healed; without hematoma or erythema.  There is no tethering  Device is quite caudal Regular rate and rhythm, no murmur Abd-soft with active BS No Clubbing cyanosis   edema Skin-warm and dry A & Oriented  Grossly normal sensory and motor function  ECG sinus with P-synchronous/ AV  pacing    Assessment and  Plan   Complete heart block-device dependent  Pacemaker [employer] The patient's device was interrogated.  The information was reviewed. No changes were made in the programming.     NICM resolved  DM    Coronary artery disease  Hyperlipidemia   Statin intolerance    Syncope  Congestive heart failure-chronic-class II-systolic/diastolic   .nosyncope  Without symptoms of ischemia  Euvolemic continue current meds  LDL very high 10/20  Not a candidate for statins,  Have reached out to Dr MA to consider

## 2020-03-29 NOTE — Patient Instructions (Signed)

## 2020-03-30 ENCOUNTER — Encounter: Payer: Self-pay | Admitting: Internal Medicine

## 2020-03-30 LAB — PACEMAKER DEVICE OBSERVATION

## 2020-04-13 LAB — CUP PACEART INCLINIC DEVICE CHECK
Brady Statistic RA Percent Paced: 2 %
Brady Statistic RV Percent Paced: 100 %
Date Time Interrogation Session: 20210817000000
Implantable Lead Implant Date: 20160810
Implantable Lead Implant Date: 20160810
Implantable Lead Location: 753859
Implantable Lead Location: 753860
Implantable Lead Model: 7740
Implantable Lead Model: 7741
Implantable Lead Serial Number: 640659
Implantable Lead Serial Number: 682646
Implantable Pulse Generator Implant Date: 20160810
Lead Channel Impedance Value: 551 Ohm
Lead Channel Impedance Value: 750 Ohm
Lead Channel Pacing Threshold Amplitude: 0.6 V
Lead Channel Pacing Threshold Amplitude: 0.7 V
Lead Channel Pacing Threshold Pulse Width: 0.5 ms
Lead Channel Pacing Threshold Pulse Width: 0.5 ms
Lead Channel Sensing Intrinsic Amplitude: 4.1 mV
Lead Channel Setting Pacing Amplitude: 2 V
Lead Channel Setting Pacing Amplitude: 2.4 V
Lead Channel Setting Pacing Pulse Width: 0.5 ms
Lead Channel Setting Sensing Sensitivity: 2.5 mV
Pulse Gen Serial Number: 722311

## 2020-04-21 ENCOUNTER — Other Ambulatory Visit: Payer: Self-pay

## 2020-04-21 ENCOUNTER — Ambulatory Visit (INDEPENDENT_AMBULATORY_CARE_PROVIDER_SITE_OTHER): Payer: Medicare HMO | Admitting: Cardiovascular Disease

## 2020-04-21 ENCOUNTER — Telehealth: Payer: Self-pay

## 2020-04-21 ENCOUNTER — Encounter: Payer: Self-pay | Admitting: Cardiovascular Disease

## 2020-04-21 VITALS — BP 120/74 | HR 63 | Ht 62.0 in | Wt 149.2 lb

## 2020-04-21 DIAGNOSIS — Z950 Presence of cardiac pacemaker: Secondary | ICD-10-CM | POA: Diagnosis not present

## 2020-04-21 DIAGNOSIS — E785 Hyperlipidemia, unspecified: Secondary | ICD-10-CM

## 2020-04-21 DIAGNOSIS — I10 Essential (primary) hypertension: Secondary | ICD-10-CM | POA: Diagnosis not present

## 2020-04-21 DIAGNOSIS — I5022 Chronic systolic (congestive) heart failure: Secondary | ICD-10-CM | POA: Diagnosis not present

## 2020-04-21 NOTE — Progress Notes (Signed)
Cardiology Office Note   Date:  04/21/2020   ID:  Bethany Gillespie, DOB 1950-10-08, MRN [id number]  PCP:  Kirk Ruths, MD  Cardiologist:   Kathlyn Sacramento, MD   Chief Complaint  Patient presents with   office visit    3 month F/U-Patient feels as if her pacemaker is "moving" lower in her chest toward her breast; Meds verbally reviewed with patient.      History of Present Illness: Bethany Gillespie is a 69 y.o. female who presents for regarding chronic systolic heart failure due to nonischemic cardiomyopathy. This was diagnosed in July 2017 with an ejection fraction of 25-30%. Cardiac catheterization showed small vessel coronary artery disease with no obstructive disease affecting the main arteries. She had previous dual-chamber pacemaker placement in August 2016 in [place]. She has other chronic medical conditions that include diabetes mellitus and essential hypertension. She had a gradual improvement in ejection fraction on medical therapy to 50-55%.  She does have chronic kidney disease followed by nephrology and most recent creatinine was around 1.9. She reports that her pacemaker has been moving down from the original site.  I explained to her that this is not unusual and by itself does not require any intervention.  She had a chest x-ray done which showed normal lead position. She denies any chest pain or worsening dyspnea. She had an echocardiogram done in June of this year which showed an EF of 50 to 55% with no significant valvular abnormalities.  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 s/p Engineer, agricultural PPM in 03/2015   Bell's palsy    Bradycardia    CAD (coronary artery disease)    a. 02/2016 Cath: LM nl, LAD 30p, D1 small w/ severe ostial/mid dzs, LCX nl, RCA nl, EF <25%, glob HK.   Chronic combined systolic and diastolic CHF (congestive heart failure) ([place])    a. Echo in 03/2015: EF 55-60%, Grade 1 DD;  b. 02/2016  Echo: 25-30%, mod LVH, Gr1 DD, mild MR, mildly dil LA, prominent apical trabeculations.   Diabetes ([place])    Hernia of abdominal wall    HTN (hypertension)    Hypertension    NICM (nonischemic cardiomyopathy) ([place])    a. 02/2016 relatively non-obstructive cath, EF 25-30% by echo.    Past Surgical History:  Procedure Laterality Date   CARDIAC CATHETERIZATION Bilateral 03/01/2016   Procedure: Left Heart Cath and Coronary Angiography;  Surgeon: Minna Merritts, MD;  Location: [facility];  Service: Cardiovascular;  Laterality: Bilateral;   COLONOSCOPY WITH PROPOFOL N/A 05/06/2019   Procedure: COLONOSCOPY WITH PROPOFOL;  Surgeon: Toledo, Benay Pike, MD;  Location: [facility];  Service: Gastroenterology;  Laterality: N/A;   ESOPHAGOGASTRODUODENOSCOPY (EGD) WITH PROPOFOL N/A 05/06/2019   Procedure: ESOPHAGOGASTRODUODENOSCOPY (EGD) WITH PROPOFOL;  Surgeon: Toledo, Benay Pike, MD;  Location: [facility];  Service: Gastroenterology;  Laterality: N/A;   HERNIA REPAIR     INSERT / REPLACE / REMOVE PACEMAKER     PACEMAKER INSERTION     PACEMAKER INSERTION       Current Outpatient Medications  Medication Sig Dispense Refill   amLODipine (NORVASC) 5 MG tablet Take 5 mg by mouth daily.      blood glucose meter kit and supplies by Other route as directed. Contour next Meter. Dispense based on patient and insurance preference. Use up to four times daily as directed. (FOR ICD-10 E10.9, E11.9).     carvedilol (COREG)  3.125 MG tablet Take 1 tablet (3.125 mg total) by mouth 2 (two) times daily with a meal. 180 tablet 3   Cholecalciferol 25 MCG (1000 UT) tablet Take 2,000 Units by mouth daily.     Continuous Blood Gluc Receiver (FREESTYLE LIBRE 14 DAY READER) DEVI by Does not apply route.     Continuous Blood Gluc Sensor (FREESTYLE LIBRE 14 DAY SENSOR) MISC by Does not apply route every 14 (fourteen) days.     ferrous sulfate 325 (65 FE) MG tablet Take 325 mg by mouth daily  with breakfast.     insulin glargine (LANTUS) 100 UNIT/ML injection Inject 40 Units into the skin daily.     insulin lispro (HUMALOG) 100 UNIT/ML injection Inject 12 Units into the skin 3 (three) times daily before meals.     Insulin Pen Needle 31G X 6 MM MISC Use 4 times daily 150 each 3   sacubitril-valsartan (ENTRESTO) 24-26 MG Take 1 tablet by mouth 2 (two) times daily. 180 tablet 3   torsemide (DEMADEX) 20 MG tablet Take 20 mg by mouth daily.      insulin aspart (NOVOLOG) 100 UNIT/ML FlexPen Inject 12 Units into the skin as directed. 3-4 times daily prior to meals     Insulin Aspart, w/Niacinamide, (FIASP FLEXTOUCH) 100 UNIT/ML SOPN Inject 8 Units into the skin 4 (four) times daily.     insulin glargine (LANTUS) 100 unit/mL SOPN Inject 0.28 mLs (28 units) into the skin daily (Patient taking differently: 30 Units. Inject 0.28 mLs (28 units) into the skin daily) 15 mL 3   No current facility-administered medications for this visit.    Allergies:   Bee venom and Atorvastatin    Social History:  The patient  reports that she has never smoked. She has never used smokeless tobacco. She reports previous alcohol use. She reports that she does not use drugs.   Family History:  The patient's family history includes Brain cancer in her brother; Diabetes in her brother; Diabetes Mellitus I in her grandchild; Skin cancer in her mother; Stroke in her father.    ROS:  Please see the history of present illness.   Otherwise, review of systems are positive for none.   All other systems are reviewed and negative.    PHYSICAL EXAM: VS:  BP 120/74 (BP Location: Left Arm, Patient Position: Sitting, Cuff Size: Normal)    Pulse 63    Ht $R'5\' 2"'nU$  (1.575 m)    Wt 149 lb 4 oz (67.7 kg)    SpO2 97%    BMI 27.30 kg/m  , BMI Body mass index is 27.3 kg/m. GEN: Well nourished, well developed, in no acute distress  HEENT: normal  Neck: no JVD, carotid bruits, or masses Cardiac: RRR; no murmurs, rubs, or  gallops,no edema  Respiratory:  clear to auscultation bilaterally, normal work of breathing GI: soft, nontender, nondistended, + BS MS: no deformity or atrophy  Skin: warm and dry, no rash Neuro:  Strength and sensation are intact Psych: euthymic mood, full affect   EKG:  EKG is ordered today. EKG showed atrial sensed ventricular paced rhythm.   Recent Labs: No results found for requested labs within last 8760 hours.    Lipid Panel No results found for: CHOL, TRIG, HDL, CHOLHDL, VLDL, LDLCALC, LDLDIRECT    Wt Readings from Last 3 Encounters:  04/21/20 149 lb 4 oz (67.7 kg)  03/29/20 148 lb (67.1 kg)  01/15/20 152 lb 6 oz (69.1 kg)  No flowsheet data found.    ASSESSMENT AND PLAN:  1.  Chronic systolic heart failure: Most recent ejection fraction was 50-55%. She is currently New York Heart Association class II.    She appears to be euvolemic on small dose torsemide with stable renal function.  Continue carvedilol and Entresto.   2. History of second-degree AV block status post dual-chamber pacemaker placement: This is followed by our device clinic.  Most recent interrogation was normal.  3. Diabetes mellitus, managed by primary care physician.  4.  Hyperlipidemia: I reviewed most recent lipid profile done in May which showed an LDL of 165 and triglyceride of 115.  It appears that she is intolerant of atorvastatin with previous myalgia.  We will discussed with the patient the option of trying another statin versus treatment with a PCSK9 inhibitor.  We need to get her LDL below 70 given that she is diabetic.   Disposition:   FU with me in 6 months  Signed,  Kathlyn Sacramento, MD  04/21/2020 10:04 AM    [place]

## 2020-04-21 NOTE — Patient Instructions (Signed)

## 2020-04-21 NOTE — Telephone Encounter (Signed)
-----   Message from Wellington Hampshire, MD sent at 04/21/2020 10:14 AM EDT ----- She left before I discussed the issue of hyperlipidemia with her.  I did not realize that she had labs done with her primary care physician and they were initially not seen by me until I checked care everywhere.  Her LDL was 165 and given that she is diabetic we need to get that down to below 70.  It appears that she did not tolerate atorvastatin in the past due to myalgia.  Is she willing to try another statin like rosuvastatin 10 mg daily?  Or if she has been tried on other statins and did not tolerate, we will need to start an injectable PCSK9 inhibitor.  Please discussed with her and see what she wants to do.  Thanks

## 2020-04-21 NOTE — Telephone Encounter (Signed)
Attempted to contact the patient regarding Dr. Tyrell Antonio recommendation below. Unable to lmtcb. Patients phone line is busy. Will attempt to reach the patient again.

## 2020-04-22 NOTE — Telephone Encounter (Signed)
2nd attempt made to contact the patient. Unable to lmom patients phone line still give a busy signal.

## 2020-04-26 NOTE — Telephone Encounter (Signed)
3rd attempt to contact the patient unsuccessful. Letter mailed to the patient to contact our office.

## 2020-04-28 DIAGNOSIS — E1165 Type 2 diabetes mellitus with hyperglycemia: Secondary | ICD-10-CM | POA: Diagnosis not present

## 2020-05-03 ENCOUNTER — Telehealth: Payer: Self-pay | Admitting: Internal Medicine

## 2020-05-03 DIAGNOSIS — E785 Hyperlipidemia, unspecified: Secondary | ICD-10-CM

## 2020-05-03 MED ORDER — ROSUVASTATIN CALCIUM 10 MG PO TABS: 10.0000 mg | ORAL_TABLET | Freq: Every day | ORAL | 2 refills | Status: DC

## 2020-05-03 NOTE — Telephone Encounter (Signed)
Patient is calling regarding a letter she received to call us . Please call to discuss.

## 2020-05-03 NOTE — Telephone Encounter (Signed)
Patient made aware of Dr. Tyrell Antonio recommendation and is agreeable with starting rosuvastatin 10 mg daily. An Rx has been sent to the patient pharmacy. Patients will have fasting lipid and [facility]. Adv the patient to contact the office sooner if needed.  Message from Bethany Hampshire, MD sent at 04/21/2020 10:14 AM EDT ----- She left before I discussed the issue of hyperlipidemia with her.  I did not realize that she had labs done with her primary care physician and they were initially not seen by me until I checked care everywhere.  Her LDL was 165 and given that she is diabetic we need to get that down to below 70.  It appears that she did not tolerate atorvastatin in the past due to myalgia.  Is she willing to try another statin like rosuvastatin 10 mg daily?  Or if she has been tried on other statins and did not tolerate, we will need to start an injectable PCSK9 inhibitor.  Please discussed with her and see what she wants to do.  Thanks

## 2020-05-05 ENCOUNTER — Ambulatory Visit (INDEPENDENT_AMBULATORY_CARE_PROVIDER_SITE_OTHER): Payer: Medicare HMO | Admitting: Emergency Medicine

## 2020-05-05 DIAGNOSIS — I441 Atrioventricular block, second degree: Secondary | ICD-10-CM

## 2020-05-05 LAB — CUP PACEART REMOTE DEVICE CHECK
Battery Remaining Longevity: 102 mo
Battery Remaining Percentage: 97 %
Brady Statistic RA Percent Paced: 13 %
Brady Statistic RV Percent Paced: 99 %
Date Time Interrogation Session: 20210923023100
Implantable Lead Implant Date: 20160810
Implantable Lead Implant Date: 20160810
Implantable Lead Location: 753859
Implantable Lead Location: 753860
Implantable Lead Model: 7740
Implantable Lead Model: 7741
Implantable Lead Serial Number: 640659
Implantable Lead Serial Number: 682646
Implantable Pulse Generator Implant Date: 20160810
Lead Channel Impedance Value: 555 Ohm
Lead Channel Impedance Value: 758 Ohm
Lead Channel Setting Pacing Amplitude: 2 V
Lead Channel Setting Pacing Amplitude: 2.4 V
Lead Channel Setting Pacing Pulse Width: 0.5 ms
Lead Channel Setting Sensing Sensitivity: 2.5 mV
Pulse Gen Serial Number: 722311

## 2020-05-10 DIAGNOSIS — E113293 Type 2 diabetes mellitus with mild nonproliferative diabetic retinopathy without macular edema, bilateral: Secondary | ICD-10-CM | POA: Diagnosis not present

## 2020-05-10 DIAGNOSIS — Z794 Long term (current) use of insulin: Secondary | ICD-10-CM | POA: Diagnosis not present

## 2020-05-10 DIAGNOSIS — E1129 Type 2 diabetes mellitus with other diabetic kidney complication: Secondary | ICD-10-CM | POA: Diagnosis not present

## 2020-05-10 DIAGNOSIS — E1122 Type 2 diabetes mellitus with diabetic chronic kidney disease: Secondary | ICD-10-CM | POA: Diagnosis not present

## 2020-05-10 DIAGNOSIS — E1159 Type 2 diabetes mellitus with other circulatory complications: Secondary | ICD-10-CM | POA: Diagnosis not present

## 2020-05-10 DIAGNOSIS — R809 Proteinuria, unspecified: Secondary | ICD-10-CM | POA: Diagnosis not present

## 2020-05-10 DIAGNOSIS — E11649 Type 2 diabetes mellitus with hypoglycemia without coma: Secondary | ICD-10-CM | POA: Diagnosis not present

## 2020-05-10 DIAGNOSIS — N184 Chronic kidney disease, stage 4 (severe): Secondary | ICD-10-CM | POA: Diagnosis not present

## 2020-05-10 NOTE — Progress Notes (Signed)
Remote pacemaker transmission.   

## 2020-07-05 ENCOUNTER — Other Ambulatory Visit: Payer: Self-pay | Admitting: Nephrology

## 2020-07-05 DIAGNOSIS — N184 Chronic kidney disease, stage 4 (severe): Secondary | ICD-10-CM

## 2020-07-05 DIAGNOSIS — N2889 Other specified disorders of kidney and ureter: Secondary | ICD-10-CM

## 2020-07-13 ENCOUNTER — Other Ambulatory Visit
Admission: RE | Admit: 2020-07-13 | Discharge: 2020-07-13 | Disposition: A | Discharge status: Home / Self Care | Payer: Medicare Other | Attending: Cardiovascular Disease | Admitting: Cardiovascular Disease

## 2020-07-13 DIAGNOSIS — E785 Hyperlipidemia, unspecified: Secondary | ICD-10-CM

## 2020-07-13 LAB — HEPATIC FUNCTION PANEL
ALT: 14 U/L (ref 0–44)
AST: 12 U/L — ABNORMAL LOW (ref 15–41)
Albumin: 3.9 g/dL (ref 3.5–5.0)
Alkaline Phosphatase: 61 U/L (ref 38–126)
Bilirubin, Direct: <0.1 mg/dL (ref 0.0–0.2)
Total Bilirubin: 0.7 mg/dL (ref 0.3–1.2)
Total Protein: 6.8 g/dL (ref 6.5–8.1)

## 2020-07-13 LAB — LIPID PANEL
Cholesterol: 164 mg/dL (ref 0–200)
HDL: 61 mg/dL (ref >40)
LDL Cholesterol: 76 mg/dL (ref 0–99)
Total CHOL/HDL Ratio: 2.7 RATIO
Triglycerides: 133 mg/dL (ref <150)
VLDL: 27 mg/dL (ref 0–40)

## 2020-08-01 ENCOUNTER — Ambulatory Visit
Admission: RE | Admit: 2020-08-01 | Discharge: 2020-08-01 | Disposition: A | Discharge status: Home / Self Care | Payer: Medicare Other | Source: Ambulatory Visit | Attending: Nephrology | Admitting: Nephrology

## 2020-08-01 ENCOUNTER — Other Ambulatory Visit: Payer: Self-pay

## 2020-08-01 DIAGNOSIS — N184 Chronic kidney disease, stage 4 (severe): Secondary | ICD-10-CM | POA: Diagnosis present

## 2020-08-01 DIAGNOSIS — N2889 Other specified disorders of kidney and ureter: Secondary | ICD-10-CM | POA: Diagnosis present

## 2020-08-04 ENCOUNTER — Ambulatory Visit (INDEPENDENT_AMBULATORY_CARE_PROVIDER_SITE_OTHER): Payer: Medicare Other

## 2020-08-04 DIAGNOSIS — I441 Atrioventricular block, second degree: Secondary | ICD-10-CM | POA: Diagnosis not present

## 2020-08-04 LAB — CUP PACEART REMOTE DEVICE CHECK
Battery Remaining Longevity: 96 mo
Battery Remaining Percentage: 92 %
Brady Statistic RA Percent Paced: 9 %
Brady Statistic RV Percent Paced: 99 %
Date Time Interrogation Session: 20211223023200
Implantable Lead Implant Date: 20160810
Implantable Lead Implant Date: 20160810
Implantable Lead Location: 753859
Implantable Lead Location: 753860
Implantable Lead Model: 7740
Implantable Lead Model: 7741
Implantable Lead Serial Number: 640659
Implantable Lead Serial Number: 682646
Implantable Pulse Generator Implant Date: 20160810
Lead Channel Impedance Value: 584 Ohm
Lead Channel Impedance Value: 724 Ohm
Lead Channel Setting Pacing Amplitude: 2 V
Lead Channel Setting Pacing Amplitude: 2.4 V
Lead Channel Setting Pacing Pulse Width: 0.5 ms
Lead Channel Setting Sensing Sensitivity: 2.5 mV
Pulse Gen Serial Number: 722311

## 2020-08-11 ENCOUNTER — Other Ambulatory Visit: Payer: Self-pay | Admitting: Family

## 2020-08-11 DIAGNOSIS — I10 Essential (primary) hypertension: Secondary | ICD-10-CM

## 2020-08-18 NOTE — Progress Notes (Signed)
Remote pacemaker transmission.   

## 2020-11-02 ENCOUNTER — Ambulatory Visit (INDEPENDENT_AMBULATORY_CARE_PROVIDER_SITE_OTHER): Payer: 59 | Admitting: Family

## 2020-11-02 ENCOUNTER — Encounter: Payer: Self-pay | Admitting: Family

## 2020-11-02 ENCOUNTER — Other Ambulatory Visit: Payer: Self-pay

## 2020-11-02 ENCOUNTER — Encounter: Payer: Self-pay | Admitting: Internal Medicine

## 2020-11-02 VITALS — BP 118/56 | HR 62 | Ht 62.0 in | Wt 148.0 lb

## 2020-11-02 DIAGNOSIS — I441 Atrioventricular block, second degree: Secondary | ICD-10-CM

## 2020-11-02 DIAGNOSIS — I5022 Chronic systolic (congestive) heart failure: Secondary | ICD-10-CM

## 2020-11-02 DIAGNOSIS — Z950 Presence of cardiac pacemaker: Secondary | ICD-10-CM | POA: Diagnosis not present

## 2020-11-02 DIAGNOSIS — E785 Hyperlipidemia, unspecified: Secondary | ICD-10-CM

## 2020-11-02 DIAGNOSIS — I10 Essential (primary) hypertension: Secondary | ICD-10-CM

## 2020-11-02 MED ORDER — ROSUVASTATIN CALCIUM 20 MG PO TABS: 20.0000 mg | ORAL_TABLET | Freq: Every day | ORAL | 3 refills | Status: DC

## 2020-11-02 NOTE — Patient Instructions (Addendum)
Medication Instructions:  Your physician has recommended you make the following change in your medication:   CHANGE Rosuvastatin (Crestor) to 20mg  daily  You may take two of your 10mg  tablets together to use those up, if you wish.   *If you need a refill on your cardiac medications before your next appointment, please call your pharmacy*  Lab Work: None ordered today.   Testing/Procedures: Your EKG today shows your pacemaker is functioning appropriately.   Follow-Up: At [facility], you and your health needs are our priority.  As part of our continuing mission to provide you with exceptional heart care, we have created designated Provider Care Teams.  These Care Teams include your primary Cardiologist (physician) and Advanced Practice Providers (APPs -  Physician Assistants and Nurse Practitioners) who all work together to provide you with the care you need, when you need it.  We recommend signing up for the patient portal called "MyChart".  Sign up information is provided on this After Visit Summary.  MyChart is used to connect with patients for Virtual Visits (Telemedicine).  Patients are able to view lab/test results, encounter notes, upcoming appointments, etc.  Non-urgent messages can be sent to your provider as well.   To learn more about what you can do with MyChart, go to [email or web address].    Your next appointment:   In August with Dr. Caryl Gillespie at [facility] in [place] In 6-8 months with Dr. Fletcher Gillespie either in [place] or in [place]  Other Instructions  Heart Healthy Diet Recommendations: A low-salt diet is recommended. Meats should be grilled, baked, or boiled. Avoid fried foods. Focus on lean protein sources like fish or chicken with vegetables and fruits. The American Heart Association is a Microbiologist!  American Heart Association Diet and Lifeystyle Recommendations   Exercise recommendations: The American Heart Association recommends 150 minutes of moderate  intensity exercise weekly. Try 30 minutes of moderate intensity exercise 4-5 times per week. This could include walking, jogging, or swimming.

## 2020-11-02 NOTE — Progress Notes (Signed)
Office Visit    Patient Name: Bethany Gillespie Date of Encounter: 11/02/2020  Primary Care Provider:  Arthur Holms, NP Primary Cardiologist:  Kathlyn Sacramento, MD Electrophysiologist:  None   Chief Complaint    Bethany Gillespie is a 70 y.o. female with a hx of chronic systolic heart failure secondary to an ICM, nonobstructive coronary artery disease, PPM, DM2, HTN presents today for follow-up of her heart failure.  Past Medical History    Past Medical History:  Diagnosis Date  . 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 s/p Engineer, agricultural PPM in 03/2015  . Bell's palsy   . Bradycardia   . CAD (coronary artery disease)    a. 02/2016 Cath: LM nl, LAD 30p, D1 small w/ severe ostial/mid dzs, LCX nl, RCA nl, EF <25%, glob HK.  . Chronic combined systolic and diastolic CHF (congestive heart failure) ([place])    a. Echo in 03/2015: EF 55-60%, Grade 1 DD;  b. 02/2016 Echo: 25-30%, mod LVH, Gr1 DD, mild MR, mildly dil LA, prominent apical trabeculations.  . Diabetes ([place])   . Hernia of abdominal wall   . HTN (hypertension)   . Hypertension   . NICM (nonischemic cardiomyopathy) ([place])    a. 02/2016 relatively non-obstructive cath, EF 25-30% by echo.   Past Surgical History:  Procedure Laterality Date  . CARDIAC CATHETERIZATION Bilateral 03/01/2016   Procedure: Left Heart Cath and Coronary Angiography;  Surgeon: Minna Merritts, MD;  Location: [place] [facility];  Service: Cardiovascular;  Laterality: Bilateral;  . COLONOSCOPY WITH PROPOFOL N/A 05/06/2019   Procedure: COLONOSCOPY WITH PROPOFOL;  Surgeon: Toledo, Benay Pike, MD;  Location: [facility];  Service: Gastroenterology;  Laterality: N/A;  . ESOPHAGOGASTRODUODENOSCOPY (EGD) WITH PROPOFOL N/A 05/06/2019   Procedure: ESOPHAGOGASTRODUODENOSCOPY (EGD) WITH PROPOFOL;  Surgeon: Toledo, Benay Pike, MD;  Location: [facility];  Service: Gastroenterology;  Laterality: N/A;  . HERNIA REPAIR    . INSERT / REPLACE / REMOVE PACEMAKER    .  PACEMAKER INSERTION    . PACEMAKER INSERTION      Allergies  Allergies  Allergen Reactions  . Bee Venom Anaphylaxis  . Atorvastatin Other (See Comments)    Severe leg cramping    History of Present Illness    Bethany Gillespie is a 70 y.o. female with a hx of chronic systolic heart failure secondary to an ICM, nonobstructive CAD, PPM, DM 2, HTN last seen 04/21/20 by Dr. Fletcher Anon. Addio PPM placed August 2016 in secondary to second-degree heart block.[name] Her heart failure was initially diagnosed July 2017 with EF 25 to 30%.  Cardiac catheterization with small vessel coronary artery disease with nonobstructive disease noted in main arteries.  Improvement in EF on medical therapy with echo 02/2017 with EF 50 to 55%.  Presents today for follow up. At last clinic visit she was started on Crestor $RemoveBe'10mg'VOsdvjrDF$  daily due to hyperlipidemia and previous intolerance to atorvastatin. Tolerating without myalgias. Enjoys watching her two grandchildren who keep her active. Eating a heart healthy diet. She reports no chest pain, pressure, tightness.  She reports no lightheadedness, dizziness, near-syncope, syncope. She reports no shortness of breath at rest.  She reports her dyspnea on exertion continues to improve. Follows closely with nephrology. Established with a new PCP to be closer to her home in [place] recently who she likes. Has stopped driving due to macular degeneration and is using rides through her insurance for transportation to appointments. Wonders about seeing Dr. Caryl Comes and Dr. Fletcher Anon in [place]  for easier transportation.   EKGs/Labs/Other Studies Reviewed:   The following studies were reviewed today:  Echo 02/2017 - Left ventricle: The cavity size was normal. There was mild   concentric hypertrophy. Systolic function was normal. The   estimated ejection fraction was in the range of 50% to 55%. Wall   motion was normal; Anterospetal wall motion consistent with   conduction abnormality. Doppler  parameters are consistent with   abnormal left ventricular relaxation (grade 1 diastolic   dysfunction). - Left atrium: The atrium was normal in size. - Right ventricle: Systolic function was normal. - Pulmonary arteries: Systolic pressure was within the normal   range.  EKG:   EKG is ordered today.  The ekg ordered today demonstrates atrial sensed v-paced 62 bpm with prolonged AV conduction (PR 210) and no acute St/T wave changes.    Recent Labs: 07/13/2020: ALT 14  Recent Lipid Panel    Component Value Date/Time   CHOL 164 07/13/2020 1428   TRIG 133 07/13/2020 1428   HDL 61 07/13/2020 1428   CHOLHDL 2.7 07/13/2020 1428   VLDL 27 07/13/2020 1428   LDLCALC 76 07/13/2020 1428    Home Medications   Current Meds  Medication Sig  . amLODipine (NORVASC) 5 MG tablet Take 5 mg by mouth daily.   . blood glucose meter kit and supplies by Other route as directed. Contour next Meter. Dispense based on patient and insurance preference. Use up to four times daily as directed. (FOR ICD-10 E10.9, E11.9).  . carvedilol (COREG) 6.25 MG tablet Take 6.25 mg by mouth in the morning and at bedtime.  . Cholecalciferol 25 MCG (1000 UT) tablet Take 2,000 Units by mouth daily.  . Continuous Blood Gluc Receiver (FREESTYLE LIBRE 14 DAY READER) DEVI by Does not apply route.  . Continuous Blood Gluc Sensor (FREESTYLE LIBRE 14 DAY SENSOR) MISC by Does not apply route every 14 (fourteen) days.  . ferrous sulfate 325 (65 FE) MG tablet Take 325 mg by mouth daily with breakfast.  . insulin aspart (FIASP) 100 UNIT/ML FlexTouch Pen Inject 8 Units into the skin 4 (four) times daily.  . insulin glargine (LANTUS) 100 UNIT/ML injection Inject 35 Units into the skin daily.  . insulin lispro (HUMALOG) 100 UNIT/ML injection Inject 12 Units into the skin 3 (three) times daily before meals.  . Insulin Pen Needle 31G X 6 MM MISC Use 4 times daily  . rosuvastatin (CRESTOR) 20 MG tablet Take 1 tablet (20 mg total) by mouth  daily.  . sacubitril-valsartan (ENTRESTO) 24-26 MG Take 1 tablet by mouth 2 (two) times daily.  [name] torsemide (DEMADEX) 20 MG tablet Take 20 mg by mouth daily.   . [DISCONTINUED] rosuvastatin (CRESTOR) 10 MG tablet Take 1 tablet (10 mg total) by mouth daily.   Review of Systems    All other systems reviewed and are otherwise negative except as noted above.  Physical Exam    VS:  BP (!) 118/56 (BP Location: Left Arm, Patient Position: Sitting, Cuff Size: Normal)   Pulse 62   Ht $R'5\' 2"'XP$  (1.575 m)   Wt 148 lb (67.1 kg)   SpO2 98%   BMI 27.07 kg/m  , BMI Body mass index is 27.07 kg/m. GEN: Well nourished, well developed, in no acute distress. HEENT: normal. Neck: Supple, no JVD, carotid bruits, or masses. Cardiac: RRR, no murmurs, rubs, or gallops. No clubbing, cyanosis, edema.  Radials/DP/PT 2+ and equal bilaterally.  Respiratory:  Respirations regular and unlabored, clear to auscultation  bilaterally. GI: Soft, nontender, nondistended. MS: No deformity or atrophy. Skin: Warm and dry, no rash. Neuro:  Strength and sensation are intact. Psych: Normal affect.  Assessment & Plan    1. Chronic systolic heart failure -most recent EF 50 to 55%.  NYHA class II with mild dyspnea with more than usual exertion..  She is euvolemic on exam today.  She continues to monitor her weight and volume status.  Continue Coreg 6.$RemoveBeforeDE'25mg'yvQoAbTVOuCANmU$  BID, Entresto 24-$RemoveBeforeDE'26mg'sgJuqXrdieeiHDf$  BID, Torsemide $RemoveBefore'20mg'cmVbvsNJISQxP$  QD. No MRA due to CKD.   2. CKD -Careful titration of diuretic antihypertensive. Continue to follow with nephrology.   3. HTN -BP well controlled.  Continue present regimen  4. Second-degree AV block s/p PPM - Continues to follow with device clinic and Dr. Caryl Comes  5. HLD - LDL goal <70 in setting of DM2. LDL 76 on lab work 09/2020 with PCP. Increase Crestor to $RemoveBe'20mg'CAnybFhWo$  daily. If she notices any myalgias, we will reduce to $RemoveB'10mg'HqlXGmzi$  daily and start Zetia $RemoveBef'10mg'uGBuHiWsVM$  daily. Plan for repeat lipid panel at f/u with Dr. Caryl Comes.   Disposition: Follow up in  August with Dr. Caryl Comes and in 6-8 mos with Dr. Fletcher Anon in [place].   Loel Dubonnet, NP 11/02/2020, 8:31 PM

## 2020-11-03 ENCOUNTER — Encounter: Payer: Self-pay | Admitting: Internal Medicine

## 2020-11-03 ENCOUNTER — Ambulatory Visit (INDEPENDENT_AMBULATORY_CARE_PROVIDER_SITE_OTHER): Payer: 59

## 2020-11-03 ENCOUNTER — Ambulatory Visit (INDEPENDENT_AMBULATORY_CARE_PROVIDER_SITE_OTHER): Payer: 59 | Admitting: Internal Medicine

## 2020-11-03 VITALS — BP 128/82 | HR 65 | Ht 62.0 in | Wt 149.4 lb

## 2020-11-03 DIAGNOSIS — E1159 Type 2 diabetes mellitus with other circulatory complications: Secondary | ICD-10-CM | POA: Diagnosis not present

## 2020-11-03 DIAGNOSIS — I441 Atrioventricular block, second degree: Secondary | ICD-10-CM | POA: Diagnosis not present

## 2020-11-03 DIAGNOSIS — E1165 Type 2 diabetes mellitus with hyperglycemia: Secondary | ICD-10-CM

## 2020-11-03 LAB — POCT GLYCOSYLATED HEMOGLOBIN (HGB A1C): Hemoglobin A1C: 8.9 % — AB (ref 4.0–5.6)

## 2020-11-03 MED ORDER — GLUCAGON 3 MG/DOSE NA POWD: 3.0000 mg | Freq: Once | NASAL | 11 refills | Status: DC | PRN

## 2020-11-03 MED ORDER — FREESTYLE LIBRE 14 DAY SENSOR MISC: 3 refills | Status: DC

## 2020-11-03 NOTE — Progress Notes (Addendum)
Patient ID: Mackenzye Mackel, female   DOB: 07-05-1951, 70 y.o.   MRN: [id number]   This visit occurred during the SARS-CoV-2 public health emergency.  Safety protocols were in place, including screening questions prior to the visit, additional usage of staff PPE, and extensive cleaning of exam room while observing appropriate contact time as indicated for disinfecting solutions.   HPI: Deashia Soule is a 70 y.o.-year-old female, referred by her previous endocrinologist, Dr. Gabriel Carina, for management of DM2, dx in 2000s, insulin-dependent  - started several years later, uncontrolled, with complications (CAD, nonischemic CHF, diabetic retinopathy, peripheral neuropathy, CKD stage IV with microalbuminuria).  She was previously seeing Dr. Gabriel Carina but cannot drive to her office anymore due to her worsening vision and would like to establish care with an endocrinologist in town.  Reviewed HbA1c: 12/2019: HbA1c 8.9% Lab Results  Component Value Date   HGBA1C 12.9 (H) 02/28/2016   HGBA1C 12.0 (H) 02/27/2016   HGBA1C 9.7 (H) 02/25/2014   Pt is on a regimen of: - Lantus 40 >> 35 units at bedtime - Humalog 12 units 3x a day, right before meals Prev. On Metformin and Actos.  Pt checks her sugars 4-8x a day with the Freestyle Libre CGM:   Lowest sugar was LO; she has hypoglycemia awareness at 60s.  Highest sugar was 338 (overcompensation).  Pt's meals are: - Breakfast: oatmeal + berries, yoghurt - Lunch: salad or cheese + crackers, soup - Dinner: meat + veggies or starch - Snacks: 3: nuts, popcorn  - + CKD with microalbuminuria - Dr. Candiss Norse, last BUN/creatinine:  09/14/2020: 20/1.9, GFR 26, glucose 174 05/24/2020: 25/2.1 glucose 118 Lab Results  Component Value Date   BUN 26 (H) 12/27/2016   BUN 14 05/21/2016   CREATININE 1.84 (H) 10/20/2018   CREATININE 1.90 (H) 04/15/2018  She is on Entresto twice a day.  - + HL; last set of lipids: 09/14/2020: 166/145/61.4/76 Lab Results  Component Value  Date   CHOL 164 07/13/2020   HDL 61 07/13/2020   LDLCALC 76 07/13/2020   TRIG 133 07/13/2020   CHOLHDL 2.7 07/13/2020  She is on Crestor 10 mg daily.  Managed by cardiology (Dr. Fletcher Anon).  - last eye exam was 2 weeks ago.  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 + IO injections.  - + numbness and tingling in her feet.  Pt has FH of DM in brother and grandson (DM1).  Latest TSH was normal, at 1.074 on 08/29/2018. She is on iron sulfate for anemia. She has a history of adrenal adenoma in 09.2019 >> c/w benign adenoma. She saw nephrology for a renal cyst.  ROS: Constitutional: + weight gain, no weight loss, no fatigue, no subjective hyperthermia, no subjective hypothermia, + nocturia, + poor sleep Eyes: + Blurry vision, no xerophthalmia ENT: no sore throat, no nodules palpated in neck, no dysphagia, no odynophagia, no hoarseness, no tinnitus, no hypoacusis Cardiovascular: no CP, no SOB, no palpitations, no leg swelling Respiratory: + Cough, no SOB, no wheezing Gastrointestinal: no N, no V, + D and also C, + acid reflux Musculoskeletal: no muscle, no joint aches Skin: no rash, no hair loss Neurological: no tremors, no numbness or tingling/no dizziness/no HAs Psychiatric: no depression, no anxiety  Past Medical History:  Diagnosis Date  . 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 s/p Engineer, agricultural PPM in 03/2015  . Bell's palsy   . Bradycardia   . CAD (coronary artery disease)  a. 02/2016 Cath: LM nl, LAD 30p, D1 small w/ severe ostial/mid dzs, LCX nl, RCA nl, EF <25%, glob HK.  . Chronic combined systolic and diastolic CHF (congestive heart failure) ([place])    a. Echo in 03/2015: EF 55-60%, Grade 1 DD;  b. 02/2016 Echo: 25-30%, mod LVH, Gr1 DD, mild MR, mildly dil LA, prominent apical trabeculations.  . Diabetes ([place])   . Hernia of abdominal wall   . HTN (hypertension)   . Hypertension   . NICM (nonischemic cardiomyopathy) ([place])     a. 02/2016 relatively non-obstructive cath, EF 25-30% by echo.   Past Surgical History:  Procedure Laterality Date  . CARDIAC CATHETERIZATION Bilateral 03/01/2016   Procedure: Left Heart Cath and Coronary Angiography;  Surgeon: Minna Merritts, MD;  Location: [place] [facility];  Service: Cardiovascular;  Laterality: Bilateral;  . COLONOSCOPY WITH PROPOFOL N/A 05/06/2019   Procedure: COLONOSCOPY WITH PROPOFOL;  Surgeon: Toledo, Benay Pike, MD;  Location: [facility];  Service: Gastroenterology;  Laterality: N/A;  . ESOPHAGOGASTRODUODENOSCOPY (EGD) WITH PROPOFOL N/A 05/06/2019   Procedure: ESOPHAGOGASTRODUODENOSCOPY (EGD) WITH PROPOFOL;  Surgeon: Toledo, Benay Pike, MD;  Location: [facility] END[facility];  Service: Gastroenterology;  Laterality: N/A;  . HERNIA REPAIR    . INSERT / REPLACE / REMOVE PACEMAKER    . PACEMAKER INSERTION    . PACEMAKER INSERTION     Social History   Socioeconomic History  . Marital status: Widowed    Spouse name: Not on file  . Number of children: 5  . Years of education: Not on file  . Highest education level: Not on file  Occupational History  . Not on file  Tobacco Use  . Smoking status: Never Smoker  . Smokeless tobacco: Never Used  Vaping Use  . Vaping Use: Never used  Substance and Sexual Activity  . Alcohol use: Not Currently  . Drug use: No  . Sexual activity: Not on file  Other Topics Concern  . Not on file  Social History Narrative  . Not on file   Social Determinants of Health   Financial Resource Strain: Not on file  Food Insecurity: Not on file  Transportation Needs: Not on file  Physical Activity: Not on file  Stress: Not on file  Social Connections: Not on file  Intimate Partner Violence: Not on file   Current Outpatient Medications on File Prior to Visit  Medication Sig Dispense Refill  . amLODipine (NORVASC) 5 MG tablet Take 5 mg by mouth daily.     . blood glucose meter kit and supplies by Other route as directed. Contour next  Meter. Dispense based on patient and insurance preference. Use up to four times daily as directed. (FOR ICD-10 E10.9, E11.9).    . carvedilol (COREG) 6.25 MG tablet Take 6.25 mg by mouth in the morning and at bedtime.    . Cholecalciferol 25 MCG (1000 UT) tablet Take 2,000 Units by mouth daily.    . Continuous Blood Gluc Receiver (FREESTYLE LIBRE 14 DAY READER) DEVI by Does not apply route.    . ferrous sulfate 325 (65 FE) MG tablet Take 325 mg by mouth daily with breakfast.    . insulin glargine (LANTUS) 100 UNIT/ML injection Inject 35 Units into the skin daily.    . insulin lispro (HUMALOG) 100 UNIT/ML injection Inject 12 Units into the skin 3 (three) times daily before meals.    . Insulin Pen Needle 31G X 6 MM MISC Use 4 times daily 150 each 3  . rosuvastatin (  CRESTOR) 20 MG tablet Take 1 tablet (20 mg total) by mouth daily. 90 tablet 3  . sacubitril-valsartan (ENTRESTO) 24-26 MG Take 1 tablet by mouth 2 (two) times daily. 180 tablet 3  . torsemide (DEMADEX) 20 MG tablet Take 20 mg by mouth daily.      No current facility-administered medications on file prior to visit.   Allergies  Allergen Reactions  . Bee Venom Anaphylaxis  . Atorvastatin Other (See Comments)    Severe leg cramping   Family History  Problem Relation Age of Onset  . Diabetes Mellitus I Grandchild   . Stroke Father   . Diabetes Brother   . Skin cancer Mother   . Brain cancer Brother    PE: BP 128/82 (BP Location: Right Arm, Patient Position: Sitting, Cuff Size: Normal)   Pulse 65   Ht $R'5\' 2"'QW$  (1.575 m)   Wt 149 lb 6.4 oz (67.8 kg)   SpO2 97%   BMI 27.33 kg/m  Wt Readings from Last 3 Encounters:  11/03/20 149 lb 6.4 oz (67.8 kg)  11/02/20 148 lb (67.1 kg)  04/21/20 149 lb 4 oz (67.7 kg)   Constitutional: overweight, in NAD Eyes: PERRLA, EOMI, no exophthalmos ENT: moist mucous membranes, no thyromegaly, no cervical lymphadenopathy Cardiovascular: RRR, No MRG Respiratory: CTA B Gastrointestinal: abdomen  soft, NT, ND, BS+ Musculoskeletal: no deformities, strength intact in all 4 Skin: moist, warm, no rashes Neurological: no tremor with outstretched hands, DTR normal in all 4  ASSESSMENT: 1. DM2, insulin-dependent, uncontrolled, with long-term complications - CAD - nonischemic CHF - diabetic retinopathy - peripheral neuropathy - CKD stage IV with microalbuminuria  PLAN:  1. Patient with long-standing, uncontrolled diabetes, on basal-bolus insulin regimen, which became insufficient.  Her latest HbA1c was 8.9% and we repeated another HbA1c at this visit: Stable, 8.9%. CGM interpretation: -At today's visit, we reviewed her CGM downloads: It appears that only 44% of values are in target range (goal >70%), while 49% are higher than 180 (goal <25%), and 7% are lower than 70 (goal <4%).  The calculated average blood sugar is 179.  The projected HbA1c for the next 3 months (GMI) is 7.6%.  She has a lot of variability of her blood sugars (42%-ideally <36%). -Reviewing the CGM trends, it appears that her sugars are lower during the day which occasionally hyperglycemic spikes after breakfast and also occasionally after lunch, but with significant increase in blood sugars after her snack in the middle of the afternoon.  Upon questioning, she does not take insulin for this.  She then injects insulin for dinner and her sugars dropping abruptly afterwards.  She tries to correct the low blood sugars after dinner and sugars increase precipitously and peak around 1-2 AM, after which they start to improve. -Therefore, at today's visit, we discussed about the importance of bolusing Humalog 15 minutes before meals rather than at the start of the meals and also to always call us for a snack containing more than 15 g of carbs.  We discussed about bolusing 4 to 5 units for these.  I would also want her to have a more flexible regimen of Humalog before each meal: Since sugars may increase after breakfast I advised her to  occasionally use a higher dose of Humalog before this meal.  Her insulin with lunch appears to be adequate.  Insulin with dinner, however, is excessive so I advised her to use less Humalog for this meal, between 8 and 10 units. -I do not feel that  we absolutely need to change her Lantus dose at this point. -At next visit, we can discuss about a GLP-1 receptor agonist.  Another option would be an SGLT2 inhibitor, but I am not sure if this would be permitted from the point of view of her's CKD. -We also discussed about correction of hypoglycemia and I sent a prescription for glucagon to her pharmacy. -I refilled her CGM sensors to [facility] - I suggested to:  Patient Instructions  Please continue: - Lantus 35 units at bedtime  Please change: - Humalog  12-14 units before breakfast 10-12 units before lunch 8-10 units before dinner  Add 4-5 units before a snack with >15 g of carbs.  Take Humalog 15 min before a meal.  Try to get the glucagon kit.  Please let me know if the sugars are consistently <80 or >200.  Please return in 1.5 months.   - Strongly advised her to start checking sugars at different times of the day - check 4x a day, rotating checks - discussed about CBG targets for treatment: 80-130 mg/dL before meals and <180 mg/dL after meals; target HbA1c <7%. - given foot care handout and explained the principles  - given instructions for hypoglycemia management "15-15 rule"  - advised for yearly eye exams-she is up-to-date - Return to clinic in 1.5 months.  Addendum: Reviewed records from Dr. Gabriel Carina after the visit: 09/14/2020: HbA1c 10.8% 05/24/2020: HbA1c 10% Also reviewed kidney function and cholesterol levels-see HPI.  Philemon Kingdom, MD PhD [facility] Endocrinology

## 2020-11-03 NOTE — Patient Instructions (Addendum)
Please continue: - Lantus 35 units at bedtime  Please change: - Humalog  12-14 units before breakfast 10-12 units before lunch 8-10 units before dinner  Add 4-5 units before a snack with >15 g of carbs.  Take Humalog 15 min before a meal.  Try to get the glucagon kit.  Please let me know if the sugars are consistently <80 or >200.  Please return in 1.5 months.   PATIENT INSTRUCTIONS FOR TYPE 2 DIABETES:  **Please join MyChart!** - see attached instructions about how to join if you have not done so already.  DIET AND EXERCISE Diet and exercise is an important part of diabetic treatment.  We recommended aerobic exercise in the form of brisk walking (working between 40-60% of maximal aerobic capacity, similar to brisk walking) for 150 minutes per week (such as 30 minutes five days per week) along with 3 times per week performing 'resistance' training (using various gauge rubber tubes with handles) 5-10 exercises involving the major muscle groups (upper body, lower body and core) performing 10-15 repetitions (or near fatigue) each exercise. Start at half the above goal but build slowly to reach the above goals. If limited by weight, joint pain, or disability, we recommend daily walking in a swimming pool with water up to waist to reduce pressure from joints while allow for adequate exercise.    BLOOD GLUCOSES Monitoring your blood glucoses is important for continued management of your diabetes. Please check your blood glucoses 2-4 times a day: fasting, before meals and at bedtime (you can rotate these measurements - e.g. one day check before the 3 meals, the next day check before 2 of the meals and before bedtime, etc.).   HYPOGLYCEMIA (low blood sugar) Hypoglycemia is usually a reaction to not eating, exercising, or taking too much insulin/ other diabetes drugs.  Symptoms include tremors, sweating, hunger, confusion, headache, etc. Treat IMMEDIATELY with 15 grams of Carbs: . 4 glucose  tablets .  cup regular juice/soda . 2 tablespoons raisins . 4 teaspoons sugar . 1 tablespoon honey Recheck blood glucose in 15 mins and repeat above if still symptomatic/blood glucose <100.  RECOMMENDATIONS TO REDUCE YOUR RISK OF DIABETIC COMPLICATIONS: * Take your prescribed MEDICATION(S) * Follow a DIABETIC diet: Complex carbs, fiber rich foods, (monounsaturated and polyunsaturated) fats * AVOID saturated/trans fats, high fat foods, >2,300 mg salt per day. * EXERCISE at least 5 times a week for 30 minutes or preferably daily.  * DO NOT SMOKE OR DRINK more than 1 drink a day. * Check your FEET every day. Do not wear tightfitting shoes. Contact us if you develop an ulcer * See your EYE doctor once a year or more if needed * Get a FLU shot once a year * Get a PNEUMONIA vaccine once before and once after age 68 years  GOALS:  * Your Hemoglobin A1c of <7%  * fasting sugars need to be <130 * after meals sugars need to be <180 (2h after you start eating) * Your Systolic BP should be 517 or lower  * Your Diastolic BP should be 80 or lower  * Your HDL (Good Cholesterol) should be 40 or higher  * Your LDL (Bad Cholesterol) should be 100 or lower. * Your Triglycerides should be 150 or lower  * Your Urine microalbumin (kidney function) should be <30 * Your Body Mass Index should be 25 or lower    Please consider the following ways to cut down carbs and fat and increase fiber and micronutrients  in your diet: - substitute whole grain for white bread or pasta - substitute brown rice for white rice - substitute 90-calorie flat bread pieces for slices of bread when possible - substitute sweet potatoes or yams for white potatoes - substitute humus for margarine - substitute tofu for cheese when possible - substitute almond or rice milk for regular milk (would not drink soy milk daily due to concern for soy estrogen influence on breast cancer risk) - substitute dark chocolate for other sweets  when possible - substitute water - can add lemon or orange slices for taste - for diet sodas (artificial sweeteners will trick your body that you can eat sweets without getting calories and will lead you to overeating and weight gain in the long run) - do not skip breakfast or other meals (this will slow down the metabolism and will result in more weight gain over time)  - can try smoothies made from fruit and almond/rice milk in am instead of regular breakfast - can also try old-fashioned (not instant) oatmeal made with almond/rice milk in am - order the dressing on the side when eating salad at a restaurant (pour less than half of the dressing on the salad) - eat as little meat as possible - can try juicing, but should not forget that juicing will get rid of the fiber, so would alternate with eating raw veg./fruits or drinking smoothies - use as little oil as possible, even when using olive oil - can dress a salad with a mix of balsamic vinegar and lemon juice, for e.g. - use agave nectar, stevia sugar, or regular sugar rather than artificial sweateners - steam or broil/roast veggies  - snack on veggies/fruit/nuts (unsalted, preferably) when possible, rather than processed foods - reduce or eliminate aspartame in diet (it is in diet sodas, chewing gum, etc) Read the labels!  Try to read Dr. Janene Harvey book: "Program for Reversing Diabetes" for other ideas for healthy eating.

## 2020-11-04 LAB — CUP PACEART REMOTE DEVICE CHECK
Battery Remaining Longevity: 96 mo
Battery Remaining Percentage: 92 %
Brady Statistic RA Percent Paced: 10 %
Brady Statistic RV Percent Paced: 99 %
Date Time Interrogation Session: 20220324023100
Implantable Lead Implant Date: 20160810
Implantable Lead Implant Date: 20160810
Implantable Lead Location: 753859
Implantable Lead Location: 753860
Implantable Lead Model: 7740
Implantable Lead Model: 7741
Implantable Lead Serial Number: 640659
Implantable Lead Serial Number: 682646
Implantable Pulse Generator Implant Date: 20160810
Lead Channel Impedance Value: 577 Ohm
Lead Channel Impedance Value: 784 Ohm
Lead Channel Setting Pacing Amplitude: 2 V
Lead Channel Setting Pacing Amplitude: 2.4 V
Lead Channel Setting Pacing Pulse Width: 0.5 ms
Lead Channel Setting Sensing Sensitivity: 2.5 mV
Pulse Gen Serial Number: 722311

## 2020-11-08 ENCOUNTER — Other Ambulatory Visit (HOSPITAL_COMMUNITY): Payer: Self-pay | Admitting: Nephrology

## 2020-11-08 DIAGNOSIS — N2889 Other specified disorders of kidney and ureter: Secondary | ICD-10-CM

## 2020-11-08 DIAGNOSIS — N281 Cyst of kidney, acquired: Secondary | ICD-10-CM

## 2020-11-08 DIAGNOSIS — N184 Chronic kidney disease, stage 4 (severe): Secondary | ICD-10-CM

## 2020-11-09 ENCOUNTER — Telehealth: Payer: Self-pay | Admitting: Internal Medicine

## 2020-11-09 DIAGNOSIS — E1159 Type 2 diabetes mellitus with other circulatory complications: Secondary | ICD-10-CM

## 2020-11-09 DIAGNOSIS — E1165 Type 2 diabetes mellitus with hyperglycemia: Secondary | ICD-10-CM

## 2020-11-09 NOTE — Telephone Encounter (Signed)
Patient's insurance requires the following RX be sent to the following PHARM:  MEDICATION: Continuous Blood Gluc Sensor (FREESTYLE LIBRE 14 DAY SENSOR) MISC  PHARMACY:  [facility], Ph# [phone] Patient does not have Fax#    HAS THE PATIENT CONTACTED THEIR PHARMACY? Yes   IS THIS A 90 DAY SUPPLY : Yes  IS PATIENT OUT OF MEDICATION: Yes  IF NOT; HOW MUCH IS LEFT: 0  LAST APPOINTMENT DATE: @3 /24/2022  NEXT APPOINTMENT DATE:@5 /04/2021  DO WE HAVE YOUR PERMISSION TO LEAVE A DETAILED MESSAGE?: Yes  OTHER COMMENTS:  Patient requests the above RX be asap    **Let patient know to contact pharmacy at the end of the day to make sure medication is ready. **  ** Please notify patient to allow 48-72 hours to process**  **Encourage patient to contact the pharmacy for refills or they can request refills through [facility]**

## 2020-11-11 ENCOUNTER — Telehealth: Payer: Self-pay | Admitting: Internal Medicine

## 2020-11-11 MED ORDER — TORSEMIDE 20 MG PO TABS: 20.0000 mg | ORAL_TABLET | Freq: Every day | ORAL | 1 refills | Status: DC

## 2020-11-11 NOTE — Telephone Encounter (Signed)
*  STAT* If patient is at the pharmacy, call can be transferred to refill team.   1. Which medications need to be refilled? (please list name of each medication and dose if known) torsemide 20 MG 1 tablet daily   2. Which pharmacy/location (including street and city if local pharmacy) is medication to be sent to? [facility] on [address] in [place]   3. Do they need a 30 day or 90 day supply? 90 day

## 2020-11-11 NOTE — Telephone Encounter (Signed)
Rx request sent to pharmacy.  

## 2020-11-14 MED ORDER — FREESTYLE LIBRE 14 DAY SENSOR MISC: 3 refills | Status: DC

## 2020-11-14 NOTE — Telephone Encounter (Signed)
Rx printed and faxed to [facility] at [phone]

## 2020-11-15 NOTE — Progress Notes (Signed)
Remote pacemaker transmission.   

## 2020-11-21 ENCOUNTER — Ambulatory Visit (HOSPITAL_COMMUNITY)
Admission: RE | Admit: 2020-11-21 | Discharge: 2020-11-21 | Disposition: A | Discharge status: Home / Self Care | Payer: 59 | Source: Ambulatory Visit | Attending: Nephrology | Admitting: Nephrology

## 2020-11-21 ENCOUNTER — Other Ambulatory Visit: Payer: Self-pay

## 2020-11-21 DIAGNOSIS — N2889 Other specified disorders of kidney and ureter: Secondary | ICD-10-CM | POA: Diagnosis present

## 2020-11-21 DIAGNOSIS — N281 Cyst of kidney, acquired: Secondary | ICD-10-CM | POA: Insufficient documentation

## 2020-11-21 DIAGNOSIS — N184 Chronic kidney disease, stage 4 (severe): Secondary | ICD-10-CM | POA: Insufficient documentation

## 2020-11-21 NOTE — Progress Notes (Signed)
Per order, Changed device settings for MRI to  DOO at 80bpm  Will program device back to pre-MRI settings after completion of exam.

## 2020-11-23 NOTE — Telephone Encounter (Signed)
[facility] called to advise that still need the completed [facility]  Form completed and returned to [phone].  This would be in addition to the RX sent on 11/14/20.  They sent the form by fax on 11/18/20

## 2020-12-01 NOTE — Telephone Encounter (Signed)
Courtney with [facility] called re:  CMN form they received was missing the diagnosis code. Loma Sousa states that the form was faxed back to Fax# [phone] with diagnosis code request on 11/23/20. Loma Sousa states she will refax the above today to fax# listed above.

## 2020-12-01 NOTE — Telephone Encounter (Signed)
Form completed and faxed back to [phone]

## 2020-12-19 ENCOUNTER — Ambulatory Visit: Payer: 59 | Admitting: Internal Medicine

## 2021-01-18 ENCOUNTER — Encounter (HOSPITAL_COMMUNITY): Payer: Self-pay

## 2021-01-18 ENCOUNTER — Other Ambulatory Visit: Payer: Self-pay

## 2021-01-18 ENCOUNTER — Emergency Department (HOSPITAL_COMMUNITY)
Admission: EM | Admit: 2021-01-18 | Discharge: 2021-01-18 | Disposition: A | Discharge status: Home / Self Care | Payer: Medicare Other | Attending: Emergency Medicine | Admitting: Emergency Medicine

## 2021-01-18 DIAGNOSIS — I5022 Chronic systolic (congestive) heart failure: Secondary | ICD-10-CM | POA: Diagnosis not present

## 2021-01-18 DIAGNOSIS — S0993XA Unspecified injury of face, initial encounter: Secondary | ICD-10-CM | POA: Diagnosis present

## 2021-01-18 DIAGNOSIS — Z794 Long term (current) use of insulin: Secondary | ICD-10-CM | POA: Diagnosis not present

## 2021-01-18 DIAGNOSIS — I2510 Atherosclerotic heart disease of native coronary artery without angina pectoris: Secondary | ICD-10-CM | POA: Insufficient documentation

## 2021-01-18 DIAGNOSIS — Z79899 Other long term (current) drug therapy: Secondary | ICD-10-CM | POA: Insufficient documentation

## 2021-01-18 DIAGNOSIS — K029 Dental caries, unspecified: Secondary | ICD-10-CM | POA: Diagnosis not present

## 2021-01-18 DIAGNOSIS — E119 Type 2 diabetes mellitus without complications: Secondary | ICD-10-CM | POA: Insufficient documentation

## 2021-01-18 DIAGNOSIS — Z950 Presence of cardiac pacemaker: Secondary | ICD-10-CM | POA: Diagnosis not present

## 2021-01-18 DIAGNOSIS — S025XXB Fracture of tooth (traumatic), initial encounter for open fracture: Secondary | ICD-10-CM | POA: Diagnosis not present

## 2021-01-18 DIAGNOSIS — K0889 Other specified disorders of teeth and supporting structures: Secondary | ICD-10-CM

## 2021-01-18 DIAGNOSIS — I110 Hypertensive heart disease with heart failure: Secondary | ICD-10-CM | POA: Insufficient documentation

## 2021-01-18 DIAGNOSIS — X58XXXA Exposure to other specified factors, initial encounter: Secondary | ICD-10-CM | POA: Insufficient documentation

## 2021-01-18 MED ORDER — PENICILLIN V POTASSIUM 500 MG PO TABS
500.0000 mg | ORAL_TABLET | Freq: Three times a day (TID) | ORAL | 0 refills | Status: DC
Start: 2021-01-18 — End: 2021-01-18

## 2021-01-18 MED ORDER — PENICILLIN V POTASSIUM 500 MG PO TABS: 500.0000 mg | ORAL_TABLET | Freq: Three times a day (TID) | ORAL | 0 refills | Status: DC

## 2021-01-18 MED ORDER — OXYCODONE-ACETAMINOPHEN 5-325 MG PO TABS
1.0000 | ORAL_TABLET | ORAL | Status: DC | PRN
  Administered 2021-01-18: 1 via ORAL
  Filled 2021-01-18: qty 1

## 2021-01-18 NOTE — ED Provider Notes (Signed)
[facility] EMERGENCY DEPARTMENT Provider Note   CSN: [id number] Arrival date & time: 01/18/21  8309     History Chief Complaint  Patient presents with  . Dental Pain    Bethany Gillespie is a 70 y.o. female.  HPI 70 year old female presents today complaining of right lower dental pain.  She states she broke her tooth yesterday.  She has had some pain coming on before that.  It worsened with the breaking.  She denies any fever, swelling, difficulty speaking, breathing, or swallowing.  She states that she did not have dental insurance but has just gotten.  She has an appoint with a dentist next week.    Past Medical History:  Diagnosis Date  . 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 s/p Engineer, agricultural PPM in 03/2015  . Bell's palsy   . Bradycardia   . CAD (coronary artery disease)    a. 02/2016 Cath: LM nl, LAD 30p, D1 small w/ severe ostial/mid dzs, LCX nl, RCA nl, EF <25%, glob HK.  . Chronic combined systolic and diastolic CHF (congestive heart failure) ([place])    a. Echo in 03/2015: EF 55-60%, Grade 1 DD;  b. 02/2016 Echo: 25-30%, mod LVH, Gr1 DD, mild MR, mildly dil LA, prominent apical trabeculations.  . Diabetes ([place])   . Hernia of abdominal wall   . HTN (hypertension)   . Hypertension   . NICM (nonischemic cardiomyopathy) ([place])    a. 02/2016 relatively non-obstructive cath, EF 25-30% by echo.    Patient Active Problem List   Diagnosis Date Noted  . Acquired cyst of kidney 04/23/2018  . Adrenal nodule ([place]) 04/23/2018  . Chronic systolic heart failure ([place]) 03/15/2016  . Poorly controlled type 2 diabetes mellitus with circulatory disorder ([place]) 03/15/2016  . Mobitz type II atrioventricular block 02/29/2016  . Status post placement of cardiac pacemaker 02/29/2016  . Shortness of breath   . HTN (hypertension) 02/26/2016  . Pleural effusion, bilateral 02/26/2016    Past Surgical History:  Procedure Laterality Date  . CARDIAC CATHETERIZATION Bilateral  03/01/2016   Procedure: Left Heart Cath and Coronary Angiography;  Surgeon: Minna Merritts, MD;  Location: [facility];  Service: Cardiovascular;  Laterality: Bilateral;  . COLONOSCOPY WITH PROPOFOL N/A 05/06/2019   Procedure: COLONOSCOPY WITH PROPOFOL;  Surgeon: Toledo, Benay Pike, MD;  Location: [facility];  Service: Gastroenterology;  Laterality: N/A;  . ESOPHAGOGASTRODUODENOSCOPY (EGD) WITH PROPOFOL N/A 05/06/2019   Procedure: ESOPHAGOGASTRODUODENOSCOPY (EGD) WITH PROPOFOL;  Surgeon: Toledo, Benay Pike, MD;  Location: [facility];  Service: Gastroenterology;  Laterality: N/A;  . HERNIA REPAIR    . INSERT / REPLACE / REMOVE PACEMAKER    . PACEMAKER INSERTION    . PACEMAKER INSERTION       OB History   No obstetric history on file.     Family History  Problem Relation Age of Onset  . Diabetes Mellitus I Grandchild   . Stroke Father   . Diabetes Brother   . Skin cancer Mother   . Brain cancer Brother     Social History   Tobacco Use  . Smoking status: Never Smoker  . Smokeless tobacco: Never Used  Vaping Use  . Vaping Use: Never used  Substance Use Topics  . Alcohol use: Not Currently  . Drug use: No    Home Medications Prior to Admission medications   Medication Sig Start Date End Date Taking? Authorizing Provider  penicillin v potassium (VEETID) 500 MG tablet  Take 1 tablet (500 mg total) by mouth 3 (three) times daily. 01/18/21  Yes Pattricia Boss, MD  amLODipine (NORVASC) 5 MG tablet Take 5 mg by mouth daily.  09/05/18   [provider]  blood glucose meter kit and supplies by Other route as directed. Contour next Meter. Dispense based on patient and insurance preference. Use up to four times daily as directed. (FOR ICD-10 E10.9, E11.9).    [provider]  carvedilol (COREG) 6.25 MG tablet Take 6.25 mg by mouth in the morning and at bedtime. 10/04/20 10/04/21  [provider]  Cholecalciferol 25 MCG (1000 UT) tablet Take 2,000 Units  by mouth daily.    [provider]  Continuous Blood Gluc Receiver (FREESTYLE LIBRE 14 DAY READER) DEVI by Does not apply route.    [provider]  Continuous Blood Gluc Sensor (FREESTYLE LIBRE 14 DAY SENSOR) MISC Use as instructed change 2x a month 11/14/20   Philemon Kingdom, MD  ferrous sulfate 325 (65 FE) MG tablet Take 325 mg by mouth daily with breakfast.    [provider]  Glucagon 3 MG/DOSE POWD Place 3 mg into the nose once as needed for up to 1 dose. 11/03/20   Philemon Kingdom, MD  insulin glargine (LANTUS) 100 UNIT/ML injection Inject 35 Units into the skin daily.    [provider]  insulin lispro (HUMALOG) 100 UNIT/ML injection Inject 12 Units into the skin 3 (three) times daily before meals.    [provider]  Insulin Pen Needle 31G X 6 MM MISC Use 4 times daily 06/13/18   Shamleffer, Melanie Crazier, MD  rosuvastatin (CRESTOR) 20 MG tablet Take 1 tablet (20 mg total) by mouth daily. 11/02/20 10/28/21  Loel Dubonnet, NP  sacubitril-valsartan (ENTRESTO) 24-26 MG Take 1 tablet by mouth 2 (two) times daily. 08/04/19   Loel Dubonnet, NP  torsemide (DEMADEX) 20 MG tablet Take 1 tablet (20 mg total) by mouth daily. 11/11/20   Deboraha Sprang, MD    Allergies    Bee venom and Atorvastatin  Review of Systems   Review of Systems  All other systems reviewed and are negative.   Physical Exam Updated Vital Signs BP 133/74   Pulse 86   Temp 98 F (36.7 C) (Oral)   Resp 16   Ht 1.575 m ($Remove'5\' 2"'Bteleex$ )   Wt 68 kg   SpO2 100%   BMI 27.42 kg/m   Physical Exam Vitals and nursing note reviewed.  Constitutional:      General: She is not in acute distress.    Appearance: She is well-developed.  HENT:     Head: Normocephalic and atraumatic.     Right Ear: External ear normal.     Left Ear: External ear normal.     Nose: Nose normal.     Mouth/Throat:     Comments: Diffuse poor dentition Fracture of the right lower canine No swelling or  fluctuance noted Eyes:     Conjunctiva/sclera: Conjunctivae normal.     Pupils: Pupils are equal, round, and reactive to light.  Pulmonary:     Effort: Pulmonary effort is normal.  Musculoskeletal:        General: Normal range of motion.     Cervical back: Normal range of motion and neck supple.  Skin:    General: Skin is warm and dry.  Neurological:     Mental Status: She is alert and oriented to person, place, and time.     Motor:  No abnormal muscle tone.     Coordination: Coordination normal.  Psychiatric:        Behavior: Behavior normal.        Thought Content: Thought content normal.     ED Results / Procedures / Treatments   Labs (all labs ordered are listed, but only abnormal results are displayed) Labs Reviewed - No data to display  EKG None  Radiology No results found.  Procedures Procedures   Medications Ordered in ED Medications  oxyCODONE-acetaminophen (PERCOCET/ROXICET) 5-325 MG per tablet 1 tablet (1 tablet Oral Given 01/18/21 0707)    ED Course  I have reviewed the triage vital signs and the nursing notes.  Pertinent labs & imaging results that were available during my care of the patient were reviewed by me and considered in my medical decision making (see chart for details).    MDM Rules/Calculators/A&P                          Patient with dental pain and fracture.  Patient given prescription for penicillin and advised regarding need for follow-up with dentist Final Clinical Impression(s) / ED Diagnoses Final diagnoses:  Pain, dental  Dental caries  Open fracture of tooth, initial encounter    Rx / DC Orders ED Discharge Orders         Ordered    penicillin v potassium (VEETID) 500 MG tablet  3 times daily        01/18/21 1045           Pattricia Boss, MD 01/18/21 1048

## 2021-01-18 NOTE — ED Triage Notes (Signed)
Patient reports she bit into a sub last night and feels like her tooth crack, pain and swelling to R side of her face

## 2021-01-18 NOTE — ED Provider Notes (Signed)
Emergency Medicine Provider Triage Evaluation Note  Bethany Gillespie , a 70 y.o. female  was evaluated in triage.  Pt complains of right lower dental pain onset yesterday afternoon, she was eating a sandwich when she felt a crack in her right lower tooth.  Immediate pain constant nonradiating worse with chewing.  Review of Systems  Positive: Dental pain Negative: Facial swelling, difficulty swallowing, difficulty breathing, fever/chills or any additional concerns  Physical Exam  BP 133/74   Pulse 86   Temp 98 F (36.7 C) (Oral)   Resp 16   Ht 5\' 2"  (1.575 m)   Wt 68 kg   SpO2 100%   BMI 27.42 kg/m  Gen:   Awake, no distress   Resp:  Normal effort  MSK:   Moves extremities without difficulty  Other:  The patient has normal phonation and is in control of secretions. No stridor.  Midline uvula without edema. Soft palate rises symmetrically. No tonsillar erythema, swelling or exudates. Tongue protrusion is normal, floor of mouth is soft. No trismus. No creptius on neck palpation. No gingival erythema or fluctuance noted. Mucus membranes moist. No pallor noted.  Poor dentition overall.  Cracked tooth appears as first bicuspid right lower.   Medical Decision Making  Medically screening exam initiated at 7:38 AM.  Appropriate orders placed.  Bethany Gillespie was informed that the remainder of the evaluation will be completed by another provider, this initial triage assessment does not replace that evaluation, and the importance of remaining in the ED until their evaluation is complete.   Note: Portions of this report may have been transcribed using voice recognition software. Every effort was made to ensure accuracy; however, inadvertent computerized transcription errors may still be present.    Bethany Gillespie 01/18/21 0741    Bethany Rasmussen, MD 01/18/21 2112

## 2021-01-19 ENCOUNTER — Telehealth: Payer: Self-pay | Admitting: Internal Medicine

## 2021-01-19 DIAGNOSIS — E1159 Type 2 diabetes mellitus with other circulatory complications: Secondary | ICD-10-CM

## 2021-01-19 NOTE — Telephone Encounter (Signed)
Patient states was in hospital yesterday and lost her freestyle libre device. She asks if we could call in another one for her to pharmacy:  [facility] [place], [place] - [address] Phone:  [phone]  Fax:  [phone]

## 2021-01-20 MED ORDER — FREESTYLE LIBRE 14 DAY READER DEVI: 0 refills | Status: DC

## 2021-01-20 NOTE — Telephone Encounter (Signed)
Rx sent to preferred pharmacy.

## 2021-01-20 NOTE — Addendum Note (Signed)
Addended by: Lauralyn Primes on: 01/20/2021 12:39 PM   Modules accepted: Orders

## 2021-01-23 NOTE — Telephone Encounter (Signed)
Patient called re: RX for Continuous Blood Gluc Receiver (FREESTYLE LIBRE 14 DAY READER) DEVI  was sent to the wrong PHARM. Patient requests a new RX for  Continuous Blood Gluc Receiver (FREESTYLE LIBRE 14 DAY READER) DEVI be sent to  [facility] located  at [address] in [place]

## 2021-01-25 NOTE — Telephone Encounter (Signed)
Rx was sent to the [facility] on [address] which is [place], [place], [place] [address]

## 2021-02-02 ENCOUNTER — Ambulatory Visit: Payer: Medicare HMO

## 2021-02-03 ENCOUNTER — Telehealth: Payer: Self-pay | Admitting: Cardiovascular Disease

## 2021-02-03 NOTE — Telephone Encounter (Signed)
Patient stated that she is returning a call

## 2021-02-06 ENCOUNTER — Telehealth: Payer: Self-pay | Admitting: Internal Medicine

## 2021-02-06 NOTE — Telephone Encounter (Addendum)
   Waylan Rocher, LPN  You 3 days ago   Hello did you call pt? I do not see a result/message         I did not call this patient.

## 2021-02-06 NOTE — Telephone Encounter (Signed)
Patient states her pharmacy got the reader and sensors for the freestyle 14 day - but they need [place] to call in the actual Meter for the [facility] 2.  [id number] [address] [place], [place]  [address] [place], [place] [address]  Phone:  [phone]  Fax:  [phone]

## 2021-02-07 NOTE — Telephone Encounter (Signed)
Patient called again re: Patient states the [facility] has not received the Freestyle Libre 2 Meter and requests a new RX for the [employer] 2 Meter be sent asap to [facility] located  at [address] in [place].  If any questions Patient requests to be called at ph# [phone]

## 2021-02-08 ENCOUNTER — Telehealth: Payer: Self-pay

## 2021-02-08 DIAGNOSIS — E1159 Type 2 diabetes mellitus with other circulatory complications: Secondary | ICD-10-CM

## 2021-02-08 MED ORDER — FREESTYLE LIBRE 14 DAY SENSOR MISC: 3 refills | Status: DC

## 2021-02-08 MED ORDER — FREESTYLE LIBRE 14 DAY SENSOR MISC
3 refills | Status: DC
Start: 2021-02-08 — End: 2021-02-09

## 2021-02-08 NOTE — Telephone Encounter (Signed)
Resent order to [facility] on [address]

## 2021-02-08 NOTE — Telephone Encounter (Signed)
Pharmacy is stating patient needs to switch to  [facility] 2 pharmacy can no longer fill the [place] 1 please advise

## 2021-02-09 ENCOUNTER — Other Ambulatory Visit: Payer: Self-pay | Admitting: Internal Medicine

## 2021-02-09 MED ORDER — FREESTYLE LIBRE 2 SENSOR MISC: 1.0000 | 3 refills | Status: DC

## 2021-02-09 NOTE — Telephone Encounter (Signed)
I sent this this am

## 2021-02-09 NOTE — Telephone Encounter (Signed)
Pt is requesting order for meter,please advise

## 2021-02-09 NOTE — Telephone Encounter (Signed)
Please advise 

## 2021-02-10 ENCOUNTER — Encounter: Payer: Self-pay | Admitting: Internal Medicine

## 2021-02-10 ENCOUNTER — Ambulatory Visit (INDEPENDENT_AMBULATORY_CARE_PROVIDER_SITE_OTHER): Payer: Medicare Other | Admitting: Internal Medicine

## 2021-02-10 ENCOUNTER — Other Ambulatory Visit: Payer: Self-pay

## 2021-02-10 VITALS — BP 128/64 | HR 64 | Ht 62.0 in | Wt 145.0 lb

## 2021-02-10 DIAGNOSIS — E1159 Type 2 diabetes mellitus with other circulatory complications: Secondary | ICD-10-CM

## 2021-02-10 DIAGNOSIS — E785 Hyperlipidemia, unspecified: Secondary | ICD-10-CM

## 2021-02-10 DIAGNOSIS — E1165 Type 2 diabetes mellitus with hyperglycemia: Secondary | ICD-10-CM | POA: Diagnosis not present

## 2021-02-10 LAB — POCT GLYCOSYLATED HEMOGLOBIN (HGB A1C): Hemoglobin A1C: 9.4 % — AB (ref 4.0–5.6)

## 2021-02-10 MED ORDER — OZEMPIC (0.25 OR 0.5 MG/DOSE) 2 MG/1.5ML ~~LOC~~ SOPN: 0.5000 mg | PEN_INJECTOR | SUBCUTANEOUS | 3 refills | Status: DC

## 2021-02-10 MED ORDER — FREESTYLE LIBRE 2 SENSOR MISC: 1.0000 | 3 refills | Status: DC

## 2021-02-10 NOTE — Progress Notes (Signed)
Patient ID: Bethany Gillespie, female   DOB: 1951-07-27, 70 y.o.   MRN: [id number]   This visit occurred during the SARS-CoV-2 public health emergency.  Safety protocols were in place, including screening questions prior to the visit, additional usage of staff PPE, and extensive cleaning of exam room while observing appropriate contact time as indicated for disinfecting solutions.   HPI: Bethany Gillespie is a 70 y.o.-year-old female, initially referred by her previous endocrinologist, Dr. Gabriel Carina, returning for follow-up for DM2, dx in 2000s, insulin-dependent  - started several years later, uncontrolled, with complications (CAD, nonischemic CHF, diabetic retinopathy, peripheral neuropathy, CKD stage IV with microalbuminuria).  She was previously seeing Dr. Gabriel Carina but cannot drive to her office anymore due to her worsening vision she had to establish care with an endocrinologist in town.  Our first visit was 3 months ago.  Interim hx: No increased urination, blurry vision, nausea, chest pain.  Reviewed HbA1c levels: Lab Results  Component Value Date   HGBA1C 8.9 (A) 11/03/2020   HGBA1C 12.9 (H) 02/28/2016   HGBA1C 12.0 (H) 02/27/2016   HGBA1C 9.7 (H) 02/25/2014  12/2019: HbA1c 8.9%  Pt is on a regimen of: - Lantus 40 >> 35 units at bedtime - Humalog 12 units 3x a day, right before meals >> 15 minutes before: 12-14 units before breakfast 10-12 units before lunch 8-10 units before dinner 4-5 units before a snack with >15 g of carbs. Prev. On Metformin and Actos.  Pt checks her sugars 4-8x a day with the Freestyle Libre CGM (lost receiver 1 mo ago): - am: 78-150 - 2h after b'fast: ? - lunch: 175-200 - 2h after lunch: ? - dinner: 175-200 - 2h after dinner: <200s - bedtime: n/c  Previously:   Lowest sugar was LO >> 70s; she has hypoglycemia awareness at 60s.  Highest sugar was 338 (overcompensation) >> 200s.  Pt's meals are: - Breakfast: oatmeal + berries, yoghurt - Lunch: salad or  cheese + crackers, soup - Dinner: meat + veggies or starch - Snacks: 3: nuts, popcorn  - + CKD with microalbuminuria - Dr. Candiss Norse, last BUN/creatinine:  09/14/2020: 20/1.9, GFR 26, glucose 174 05/24/2020: 25/2.1 glucose 118 Lab Results  Component Value Date   BUN 26 (H) 12/27/2016   BUN 14 05/21/2016   CREATININE 1.84 (H) 10/20/2018   CREATININE 1.90 (H) 04/15/2018  She is on Entresto twice a day.  - + HL; last set of lipids: 09/14/2020: 166/145/61.4/76 Lab Results  Component Value Date   CHOL 164 07/13/2020   HDL 61 07/13/2020   LDLCALC 76 07/13/2020   TRIG 133 07/13/2020   CHOLHDL 2.7 07/13/2020  She is on Crestor 10 mg daily.  Managed by cardiology (Dr. Fletcher Anon).  - last eye exam 10/2020.  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 + IO injections.  - + numbness and tingling in her feet.  Pt has FH of DM in brother and grandson (DM1).  Latest TSH was normal, at 1.074 on 08/29/2018. She is on iron sulfate for anemia. She has a history of adrenal adenoma in 09.2019 >> c/w benign adenoma. She saw nephrology for a renal cyst.  No h/o pancreatitis. No FH of MTC.  ROS: Constitutional: + weight gain, no weight loss, no fatigue, no subjective hyperthermia, no subjective hypothermia, + nocturia, + poor sleep Eyes: + Blurry vision, no xerophthalmia ENT: no sore throat, no nodules palpated in neck, no dysphagia, no odynophagia, no hoarseness, no tinnitus, no hypoacusis Cardiovascular: no  CP, no SOB, no palpitations, no leg swelling Respiratory: no Cough, no SOB, no wheezing Gastrointestinal: no N, no V, + D and also C, + acid reflux Musculoskeletal: no muscle, no joint aches Skin: no rash, no hair loss Neurological: no tremors, no numbness or tingling/no dizziness/no HAs Psychiatric: no depression, no anxiety  Past Medical History:  Diagnosis Date   AV block, Mobitz 2    a. s/p Engineer, agricultural PPM in 03/2015   Bell's  palsy    Bradycardia    CAD (coronary artery disease)    a. 02/2016 Cath: LM nl, LAD 30p, D1 small w/ severe ostial/mid dzs, LCX nl, RCA nl, EF <25%, glob HK.   Chronic combined systolic and diastolic CHF (congestive heart failure) ([place])    a. Echo in 03/2015: EF 55-60%, Grade 1 DD;  b. 02/2016 Echo: 25-30%, mod LVH, Gr1 DD, mild MR, mildly dil LA, prominent apical trabeculations.   Diabetes ([place])    Hernia of abdominal wall    HTN (hypertension)    Hypertension    NICM (nonischemic cardiomyopathy) ([place])    a. 02/2016 relatively non-obstructive cath, EF 25-30% by echo.   Past Surgical History:  Procedure Laterality Date   CARDIAC CATHETERIZATION Bilateral 03/01/2016   Procedure: Left Heart Cath and Coronary Angiography;  Surgeon: Minna Merritts, MD;  Location: [facility];  Service: Cardiovascular;  Laterality: Bilateral;   COLONOSCOPY WITH PROPOFOL N/A 05/06/2019   Procedure: COLONOSCOPY WITH PROPOFOL;  Surgeon: Toledo, Benay Pike, MD;  Location: [facility];  Service: Gastroenterology;  Laterality: N/A;   ESOPHAGOGASTRODUODENOSCOPY (EGD) WITH PROPOFOL N/A 05/06/2019   Procedure: ESOPHAGOGASTRODUODENOSCOPY (EGD) WITH PROPOFOL;  Surgeon: Toledo, Benay Pike, MD;  Location: [facility];  Service: Gastroenterology;  Laterality: N/A;   HERNIA REPAIR     INSERT / REPLACE / REMOVE PACEMAKER     PACEMAKER INSERTION     PACEMAKER INSERTION     Social History   Socioeconomic History   Marital status: Widowed    Spouse name: Not on file   Number of children: 5   Years of education: Not on file   Highest education level: Not on file  Occupational History   Not on file  Tobacco Use   Smoking status: Never   Smokeless tobacco: Never  Vaping Use   Vaping Use: Never used  Substance and Sexual Activity   Alcohol use: Not Currently   Drug use: No   Sexual activity: Not on file  Other Topics Concern   Not on file  Social History Narrative   Not on file   Social Determinants  of Health   Financial Resource Strain: Not on file  Food Insecurity: Not on file  Transportation Needs: Not on file  Physical Activity: Not on file  Stress: Not on file  Social Connections: Not on file  Intimate Partner Violence: Not on file   Current Outpatient Medications on File Prior to Visit  Medication Sig Dispense Refill   amLODipine (NORVASC) 5 MG tablet Take 5 mg by mouth daily.      blood glucose meter kit and supplies by Other route as directed. Contour next Meter. Dispense based on patient and insurance preference. Use up to four times daily as directed. (FOR ICD-10 E10.9, E11.9).     carvedilol (COREG) 6.25 MG tablet Take 6.25 mg by mouth in the morning and at bedtime.     Cholecalciferol 25 MCG (1000 UT) tablet Take 2,000 Units by mouth daily.  Continuous Blood Gluc Sensor (FREESTYLE LIBRE 2 SENSOR) MISC 1 each by Does not apply route every 14 (fourteen) days. 6 each 3   ferrous sulfate 325 (65 FE) MG tablet Take 325 mg by mouth daily with breakfast.     Glucagon 3 MG/DOSE POWD Place 3 mg into the nose once as needed for up to 1 dose. 1 each 11   insulin glargine (LANTUS) 100 UNIT/ML injection Inject 35 Units into the skin daily.     insulin lispro (HUMALOG) 100 UNIT/ML injection Inject 12 Units into the skin 3 (three) times daily before meals.     Insulin Pen Needle 31G X 6 MM MISC Use 4 times daily 150 each 3   penicillin v potassium (VEETID) 500 MG tablet Take 1 tablet (500 mg total) by mouth 3 (three) times daily. 30 tablet 0   rosuvastatin (CRESTOR) 20 MG tablet Take 1 tablet (20 mg total) by mouth daily. 90 tablet 3   sacubitril-valsartan (ENTRESTO) 24-26 MG Take 1 tablet by mouth 2 (two) times daily. 180 tablet 3   torsemide (DEMADEX) 20 MG tablet Take 1 tablet (20 mg total) by mouth daily. 90 tablet 1   No current facility-administered medications on file prior to visit.   Allergies  Allergen Reactions   Bee Venom Anaphylaxis   Atorvastatin Other (See  Comments)    Severe leg cramping   Family History  Problem Relation Age of Onset   Diabetes Mellitus I Grandchild    Stroke Father    Diabetes Brother    Skin cancer Mother    Brain cancer Brother    PE: BP 128/64   Pulse 64   Ht _0  (1.575 m)   Wt 145 lb (65.8 kg)   SpO2 96%   BMI 26.52 kg/m  Wt Readings from Last 3 Encounters:  02/10/21 145 lb (65.8 kg)  01/18/21 149 lb 14.6 oz (68 kg)  11/03/20 149 lb 6.4 oz (67.8 kg)   Constitutional: normal weight, in NAD Eyes: PERRLA, EOMI, no exophthalmos ENT: moist mucous membranes, no thyromegaly, no cervical lymphadenopathy Cardiovascular: RRR, No MRG Respiratory: CTA B Gastrointestinal: abdomen soft, NT, ND, BS+ Musculoskeletal: no deformities, strength intact in all 4 Skin: moist, warm, no rashes Neurological: no tremor with outstretched hands, DTR normal in all 4  ASSESSMENT: 1. DM2, insulin-dependent, uncontrolled, with long-term complications - CAD - nonischemic CHF - diabetic retinopathy - peripheral neuropathy - CKD stage IV with microalbuminuria  2. HL  PLAN:  1. Patient with longstanding, uncontrolled, diabetes, on basal-bolus insulin, presenting for the second visit with me, first visit 3 months ago.  At that time, we downloaded her CGM and she had significant variability in her blood sugars.  The projected HbA1c from the CGM was 7.6%, better than the directly measured HbA1c, which was stable, at 8.9%.  She had lower sugars during the day, with occasional hyperglycemic spikes after breakfast and also occasionally after lunch, but the significant increase in blood sugar after a snack in the middle of the afternoon.  She was not taking Humalog before the snack and I advised her to do so.  She was dropping her blood sugars after dinner and we discussed about the importance of bolusing Humalog 15 minutes before each meal.  I also advised him to take his Humalog before dinner, between 8 and 10 units.  We continued the  same dose of Lantus.  At that time I also sent a prescription for glucagon to pharmacy. -At today's visit, we  could not review her CGM downloads as she lost the receiver.  We sent the prescription for freestyle libre 2 sensors to her supplier (this was previously sent to the pharmacy but her insurance does not cover them through there). -Per her report, sugars are better in the morning, but they increase before lunch and before dinner, a sign of not enough mealtime coverage.  She is trying to inject Humalog 15 minutes before the meals.  She is also starting to bolus before snacks.  Therefore, at today's visit, we discussed about possibly starting a GLP-1 receptor agonist, Ozempic, at a low dose and increase as tolerated.  Explained benefits and possible side effects.  No family history of medullary thyroid cancer or personal history of pancreatitis - I suggested to:  Patient Instructions  Please continue: - Lantus 35 units at bedtime - Humalog 15 min before a meal 12-14 units before breakfast 10-12 units before lunch 8-10 units before dinner 4-5 units before a snack with >15 g of carbs.  Please start Ozempic 0.25 mg weekly in a.m. (for example on Sunday morning) x 2-4 weeks, then increase to 0.5 mg weekly in a.m. if no nausea or hypoglycemia.  Please return in 3 months.   - we checked her HbA1c: 9.4% (higher) - advised to check sugars at different times of the day - 4x a day, rotating check times - advised for yearly eye exams >> she is UTD - return to clinic in 3 months  2. HL - Reviewed latest lipid panel  Lab Results  Component Value Date   CHOL 164 07/13/2020   HDL 61 07/13/2020   LDLCALC 76 07/13/2020   TRIG 133 07/13/2020   CHOLHDL 2.7 07/13/2020  - Continues the Crestor 10 mg daily without side effects.   Philemon Kingdom, MD PhD [facility] Endocrinology

## 2021-02-10 NOTE — Patient Instructions (Addendum)
Please continue: - Lantus 35 units at bedtime - Humalog 15 min before a meal 12-14 units before breakfast 10-12 units before lunch 8-10 units before dinner 4-5 units before a snack with >15 g of carbs.  Please start Ozempic 0.25 mg weekly in a.m. (for example on Sunday morning) x 2-4 weeks, then increase to 0.5 mg weekly in a.m. if no nausea or hypoglycemia.  Please return in 3 months.

## 2021-02-22 ENCOUNTER — Telehealth: Payer: Self-pay

## 2021-02-22 NOTE — Telephone Encounter (Signed)
Papers were faxed to [employer] @ [phone] on 11/23/2020

## 2021-02-28 ENCOUNTER — Telehealth: Payer: Self-pay | Admitting: Internal Medicine

## 2021-02-28 NOTE — Telephone Encounter (Signed)
Patient called to advise that she has never received the Free Style Libre 2 sensors & meter.  She would like these to go to the diabetic supply company so that her insurance will pay for them.  Patient states that the contact number for the company is [phone].  Could not provide company name, etc.  Also advises that her mobil device is not compatible to read [employer] 2  Call back # [phone]

## 2021-03-02 NOTE — Telephone Encounter (Signed)
Spoke with pt and gave her the phone # to [facility] [employer] [facility]. Told her to contact them to get the process started.

## 2021-03-07 NOTE — Telephone Encounter (Signed)
Fax received from [employer] requesting clinical notes be faxed to [phone].

## 2021-03-23 ENCOUNTER — Telehealth: Payer: Self-pay | Admitting: Cardiovascular Disease

## 2021-03-23 DIAGNOSIS — I5022 Chronic systolic (congestive) heart failure: Secondary | ICD-10-CM

## 2021-03-23 MED ORDER — ENTRESTO 24-26 MG PO TABS: 1.0000 | ORAL_TABLET | Freq: Two times a day (BID) | ORAL | 0 refills | Status: DC

## 2021-03-23 NOTE — Telephone Encounter (Signed)
Requested Prescriptions   Signed Prescriptions Disp Refills   sacubitril-valsartan (ENTRESTO) 24-26 MG 180 tablet 0    Sig: Take 1 tablet by mouth 2 (two) times daily.    Authorizing Provider: Kathlyn Sacramento A    Ordering User: Raelene Bott, Gerilynn Mccullars L

## 2021-03-23 NOTE — Telephone Encounter (Signed)
  *  STAT* If patient is at the pharmacy, call can be transferred to refill team.   1. Which medications need to be refilled? (please list name of each medication and dose if known)   Entresto 24-26 mg po BID   2. Which pharmacy/location (including street and city if local pharmacy) is medication to be sent to? [place] [facility] [place]   3. Do they need a 30 day or 90 day supply? [place]

## 2021-03-31 ENCOUNTER — Other Ambulatory Visit: Payer: Self-pay

## 2021-03-31 ENCOUNTER — Ambulatory Visit (INDEPENDENT_AMBULATORY_CARE_PROVIDER_SITE_OTHER): Payer: Medicare Other | Admitting: Internal Medicine

## 2021-03-31 ENCOUNTER — Encounter: Payer: Self-pay | Admitting: Internal Medicine

## 2021-03-31 VITALS — BP 104/60 | HR 68 | Ht 62.0 in | Wt 140.2 lb

## 2021-03-31 DIAGNOSIS — I441 Atrioventricular block, second degree: Secondary | ICD-10-CM

## 2021-03-31 DIAGNOSIS — Z950 Presence of cardiac pacemaker: Secondary | ICD-10-CM

## 2021-03-31 NOTE — Patient Instructions (Signed)
Medication Instructions:  Your physician has recommended you make the following change in your medication:  Decrease you Amlodipine from 5mg  to 2.5mg  daily.   *If you need a refill on your cardiac medications before your next appointment, please call your pharmacy*   Lab Work: None ordered.  If you have labs (blood work) drawn today and your tests are completely normal, you will receive your results only by: [place] (if you have MyChart) OR A paper copy in the mail If you have any lab test that is abnormal or we need to change your treatment, we will call you to review the results.   Testing/Procedures: None ordered.    Follow-Up: At [facility], you and your health needs are our priority.  As part of our continuing mission to provide you with exceptional heart care, we have created designated Provider Care Teams.  These Care Teams include your primary Cardiologist (physician) and Advanced Practice Providers (APPs -  Physician Assistants and Nurse Practitioners) who all work together to provide you with the care you need, when you need it.  We recommend signing up for the patient portal called "MyChart".  Sign up information is provided on this After Visit Summary.  MyChart is used to connect with patients for Virtual Visits (Telemedicine).  Patients are able to view lab/test results, encounter notes, upcoming appointments, etc.  Non-urgent messages can be sent to your provider as well.   To learn more about what you can do with MyChart, go to [email or web address].    Your next appointment:   12 month(s)  The format for your next appointment:   In Person  Provider:   You will see one of the following Advanced Practice Providers on your designated Care Team:   Tommye Standard, [place] "[facility]" [place], [place]

## 2021-03-31 NOTE — Progress Notes (Signed)
.      Patient Care Team: Arthur Holms, NP as PCP - General (Nurse Practitioner) Wellington Hampshire, MD as PCP - Cardiology (Cardiology) Alisa Graff, FNP as Nurse Practitioner (Family Medicine) Gabriel Carina Betsey Holiday, MD as Physician Assistant (Internal Medicine)   HPI  Bethany Gillespie is a 70 y.o. female Seen in follow-up for ischemic heart disease modest left ventricular dysfunction and a previously implanted [employer] pacemaker  2016 at [place] following  Syncope with Mobitz 2 heart block.   Today, she is feeling pretty good overall.   Once in a while she becomes lightheaded when standing up or stretching her arms above her head.  Occasionally she monitors her blood pressure at home and it averages 465 systolic.  She again notes that her pacemaker moved inferiorly At this time the pacemaker is not overly bothersome.  The patient denies chest pain, shortness of breath, nocturnal dyspnea, orthopnea or peripheral edema.  There have been no palpitations,  or syncope.    DATE TEST EF   8/16  Echo   55-60 %   7/17 Cath   25 % Small vessel D1  9/17 Echo  40-45%   6/21 Echo  50-55%    Date Cr K hGB Mg  5/18  1.04 4.2    3/20  1.84     6/21 1.92 3.9 9.7   2/22 1.9 4.0 10.8   7/22   9.4        Past Medical History:  Diagnosis Date   AV block, Mobitz 2    a. s/p Engineer, agricultural PPM in 03/2015   Bell's palsy    Bradycardia    CAD (coronary artery disease)    a. 02/2016 Cath: LM nl, LAD 30p, D1 small w/ severe ostial/mid dzs, LCX nl, RCA nl, EF <25%, glob HK.   Chronic combined systolic and diastolic CHF (congestive heart failure) ([place])    a. Echo in 03/2015: EF 55-60%, Grade 1 DD;  b. 02/2016 Echo: 25-30%, mod LVH, Gr1 DD, mild MR, mildly dil LA, prominent apical trabeculations.   Diabetes ([place])    Hernia of abdominal wall    HTN (hypertension)    Hypertension    NICM (nonischemic cardiomyopathy) ([place])    a. 02/2016 relatively non-obstructive cath, EF 25-30%  by echo.    Past Surgical History:  Procedure Laterality Date   CARDIAC CATHETERIZATION Bilateral 03/01/2016   Procedure: Left Heart Cath and Coronary Angiography;  Surgeon: Minna Merritts, MD;  Location: [place] [facility];  Service: Cardiovascular;  Laterality: Bilateral;   COLONOSCOPY WITH PROPOFOL N/A 05/06/2019   Procedure: COLONOSCOPY WITH PROPOFOL;  Surgeon: Toledo, Benay Pike, MD;  Location: [facility];  Service: Gastroenterology;  Laterality: N/A;   ESOPHAGOGASTRODUODENOSCOPY (EGD) WITH PROPOFOL N/A 05/06/2019   Procedure: ESOPHAGOGASTRODUODENOSCOPY (EGD) WITH PROPOFOL;  Surgeon: Toledo, Benay Pike, MD;  Location: [facility];  Service: Gastroenterology;  Laterality: N/A;   HERNIA REPAIR     INSERT / REPLACE / REMOVE PACEMAKER     PACEMAKER INSERTION     PACEMAKER INSERTION      Current Outpatient Medications  Medication Sig Dispense Refill   amLODipine (NORVASC) 5 MG tablet Take 5 mg by mouth daily.      blood glucose meter kit and supplies by Other route as directed. Contour next Meter. Dispense based on patient and insurance preference. Use up to four times daily as directed. (FOR ICD-10 E10.9, E11.9).     carvedilol (COREG) 6.25 MG tablet Take  6.25 mg by mouth in the morning and at bedtime.     Cholecalciferol 25 MCG (1000 UT) tablet Take 2,000 Units by mouth daily.     Continuous Blood Gluc Sensor (FREESTYLE LIBRE 2 SENSOR) MISC 1 each by Does not apply route every 14 (fourteen) days. 6 each 3   ferrous sulfate 325 (65 FE) MG tablet Take 325 mg by mouth daily with breakfast.     Glucagon 3 MG/DOSE POWD Place 3 mg into the nose once as needed for up to 1 dose. 1 each 11   insulin glargine (LANTUS) 100 UNIT/ML injection Inject 35 Units into the skin daily.     insulin lispro (HUMALOG) 100 UNIT/ML injection Inject 12 Units into the skin 3 (three) times daily before meals.     Insulin Pen Needle 31G X 6 MM MISC Use 4 times daily 150 each 3   penicillin v potassium  (VEETID) 500 MG tablet Take 1 tablet (500 mg total) by mouth 3 (three) times daily. 30 tablet 0   rosuvastatin (CRESTOR) 20 MG tablet Take 1 tablet (20 mg total) by mouth daily. 90 tablet 3   sacubitril-valsartan (ENTRESTO) 24-26 MG Take 1 tablet by mouth 2 (two) times daily. 180 tablet 0   Semaglutide,0.25 or 0.5MG /DOS, (OZEMPIC, 0.25 OR 0.5 MG/DOSE,) 2 MG/1.5ML SOPN Inject 0.5 mg into the skin once a week. 4.5 mL 3   torsemide (DEMADEX) 20 MG tablet Take 1 tablet (20 mg total) by mouth daily. 90 tablet 1   No current facility-administered medications for this visit.    Allergies  Allergen Reactions   Bee Venom Anaphylaxis   Atorvastatin Other (See Comments)    Severe leg cramping      Review of Systems negative except from HPI and PMH  Physical Exam BP 104/60   Pulse 68   Ht 5\' 2"  (1.575 m)   Wt 140 lb 3.2 oz (63.6 kg)   SpO2 95%   BMI 25.64 kg/m  Well developed and well nourished in no acute distress HENT normal Neck supple with JVP-flat Clear Device pocket well healed; without hematoma or erythema.  There is no tethering  Regular rate and rhythm, no  murmur Abd-soft with active BS No Clubbing cyanosis  edema Skin-warm and dry A & Oriented  Grossly normal sensory and motor function  ECG sinus  P-synchronous/ AV  pacing       Assessment and  Plan Complete heart block-device dependent  Pacemaker [employer] is known  NICM resolved  Coronary artery disease-nonobstructive  Hypertension   Syncope  Congestive heart failure-chronic-class II-systolic/diastolic   No interval syncope  Euvolemic.  We will continue her on Demadex 20 mg daily.  Cardiomyopathy is resolved.  We will continue her on Entresto 24/26 carvedilol 6.25.    With her low blood pressure, we will decrease her amlodipine from 5--2.5.  When she sees nephrology next month he can further decrease it if her blood pressure remains in the 100-110 range.    I,Mathew Stumpf,acting as a  scribe for [name], MD.,have documented all relevant documentation on the behalf of [name], MD,as directed by  [name], MD while in the presence of [name], MD.  I, [name], MD, have reviewed all documentation for this visit. The documentation on 03/31/21 for the exam, diagnosis, procedures, and orders are all accurate and complete.

## 2021-04-04 ENCOUNTER — Encounter: Payer: 59 | Admitting: Internal Medicine

## 2021-04-04 LAB — CUP PACEART REMOTE DEVICE CHECK
Battery Remaining Longevity: 90 mo
Battery Remaining Percentage: 88 %
Brady Statistic RA Percent Paced: 11 %
Brady Statistic RV Percent Paced: 99 %
Date Time Interrogation Session: 20220623023300
Implantable Lead Implant Date: 20160810
Implantable Lead Implant Date: 20160810
Implantable Lead Location: 753859
Implantable Lead Location: 753860
Implantable Lead Model: 7740
Implantable Lead Model: 7741
Implantable Lead Serial Number: 640659
Implantable Lead Serial Number: 682646
Implantable Pulse Generator Implant Date: 20160810
Lead Channel Impedance Value: 550 Ohm
Lead Channel Impedance Value: 779 Ohm
Lead Channel Setting Pacing Amplitude: 2 V
Lead Channel Setting Pacing Amplitude: 2.4 V
Lead Channel Setting Pacing Pulse Width: 0.5 ms
Lead Channel Setting Sensing Sensitivity: 2.5 mV
Pulse Gen Serial Number: 722311

## 2021-05-04 ENCOUNTER — Ambulatory Visit (INDEPENDENT_AMBULATORY_CARE_PROVIDER_SITE_OTHER): Payer: Medicare Other

## 2021-05-04 DIAGNOSIS — I441 Atrioventricular block, second degree: Secondary | ICD-10-CM

## 2021-05-05 LAB — CUP PACEART REMOTE DEVICE CHECK
Battery Remaining Longevity: 90 mo
Battery Remaining Percentage: 85 %
Brady Statistic RA Percent Paced: 1 %
Brady Statistic RV Percent Paced: 100 %
Date Time Interrogation Session: 20220923160000
Implantable Lead Implant Date: 20160810
Implantable Lead Implant Date: 20160810
Implantable Lead Location: 753859
Implantable Lead Location: 753860
Implantable Lead Model: 7740
Implantable Lead Model: 7741
Implantable Lead Serial Number: 640659
Implantable Lead Serial Number: 682646
Implantable Pulse Generator Implant Date: 20160810
Lead Channel Impedance Value: 587 Ohm
Lead Channel Impedance Value: 828 Ohm
Lead Channel Setting Pacing Amplitude: 2 V
Lead Channel Setting Pacing Amplitude: 2.4 V
Lead Channel Setting Pacing Pulse Width: 0.5 ms
Lead Channel Setting Sensing Sensitivity: 2.5 mV
Pulse Gen Serial Number: 722311

## 2021-05-11 NOTE — Progress Notes (Signed)
Remote pacemaker transmission.   

## 2021-05-13 ENCOUNTER — Other Ambulatory Visit: Payer: Self-pay | Admitting: Internal Medicine

## 2021-05-16 ENCOUNTER — Other Ambulatory Visit: Payer: Self-pay

## 2021-05-16 ENCOUNTER — Encounter: Payer: Self-pay | Admitting: Internal Medicine

## 2021-05-16 ENCOUNTER — Ambulatory Visit (INDEPENDENT_AMBULATORY_CARE_PROVIDER_SITE_OTHER): Payer: Medicare Other | Admitting: Internal Medicine

## 2021-05-16 VITALS — BP 130/76 | HR 78 | Ht 62.0 in | Wt 136.6 lb

## 2021-05-16 DIAGNOSIS — E1159 Type 2 diabetes mellitus with other circulatory complications: Secondary | ICD-10-CM | POA: Diagnosis not present

## 2021-05-16 DIAGNOSIS — E1165 Type 2 diabetes mellitus with hyperglycemia: Secondary | ICD-10-CM

## 2021-05-16 DIAGNOSIS — E785 Hyperlipidemia, unspecified: Secondary | ICD-10-CM

## 2021-05-16 LAB — POCT GLYCOSYLATED HEMOGLOBIN (HGB A1C): Hemoglobin A1C: 9.7 % — AB (ref 4.0–5.6)

## 2021-05-16 MED ORDER — FREESTYLE LIBRE 2 READER DEVI: 1.0000 | Freq: Every day | 0 refills | Status: AC

## 2021-05-16 NOTE — Patient Instructions (Addendum)
Please continue: - Ozempic 0.5 mg weekly - Lantus 34 units at bedtime  Change: - Humalog 15 min before a meal 8-12 units before a meal 4-5 units before a snack with >15 g of carbs.   If you plan to be active after a meal, reduce the mealtime Humalog dose by 50%.   Try to eliminate the snack at bedtime.  Try to get the CGM receiver.  Please return in 3 months.

## 2021-05-16 NOTE — Progress Notes (Signed)
Patient ID: Bethany Gillespie, female   DOB: [birth year], 70 y.o.   MRN: [id number]   This visit occurred during the SARS-CoV-2 public health emergency.  Safety protocols were in place, including screening questions prior to the visit, additional usage of staff PPE, and extensive cleaning of exam room while observing appropriate contact time as indicated for disinfecting solutions.   HPI: Bethany Gillespie is a 70 y.o.-year-old female, initially referred by her previous endocrinologist, Dr. Gabriel Carina, returning for follow-up for DM2, dx in 2000s, insulin-dependent  - started several years later, uncontrolled, with complications (CAD, nonischemic CHF, diabetic retinopathy, peripheral neuropathy, CKD stage IV with microalbuminuria).  She was previously seeing Dr. Gabriel Carina but cannot drive to her office anymore due to her worsening vision she had to establish care with an endocrinologist in town.  Our last visit was 3 months ago  Interim hx: No diarrhea, nausea, chest pain. She had 8 teeth pulled 2 weeks ago - leats soft food - ow last night. Getting dentures. She did start to lose weight after starting Ozempic as it cut down her craving for sweets. She tolerates this well.  Reviewed HbA1c levels: Lab Results  Component Value Date   HGBA1C 9.4 (A) 02/10/2021   HGBA1C 8.9 (A) 11/03/2020   HGBA1C 12.9 (H) 02/28/2016   HGBA1C 12.0 (H) 02/27/2016   HGBA1C 9.7 (H) 02/25/2014  12/2019: HbA1c 8.9%  Pt is on a regimen of: - Ozempic 0.25 >> 0.5 mg weekly - added 02/2021 - tolerates it well - Lantus 40 >> 35 units at bedtime - Humalog 12 units 3x a day, right before meals >> Taking 8-12 units before meal: Prev. On Metformin and Actos.  Pt checks her sugars more than 4 times a day with her freestyle libre CGM - lost receiver again last night: - am: 78-150 >> 150-199 - 2h after b'fast: ? - lunch: 175-200 >> 100-125 - 2h after lunch: LO (if active) - dinner: 175-200 >> 100-199 - 2h after dinner: <200s >>  80-150 - bedtime: n/c - snack.  Previously:   Lowest sugar was LO >> 70s >> LO (after lunch); she has hypoglycemia awareness at 60s.  Highest sugar was 338 (overcompensation) >> 200s >> 225 (after a low).  Pt's meals are: - Breakfast: oatmeal + berries, yoghurt - Lunch: salad or cheese + crackers, soup - Dinner: meat + veggies or starch - Snacks: 3: nuts, popcorn  - + CKD with microalbuminuria - Dr. Candiss Norse, last BUN/creatinine:  09/14/2020: 20/1.9, GFR 26, glucose 174 05/24/2020: 25/2.1 glucose 118 Lab Results  Component Value Date   BUN 26 (H) 12/27/2016   BUN 14 05/21/2016   CREATININE 1.84 (H) 10/20/2018   CREATININE 1.90 (H) 04/15/2018  She is on Entresto twice a day.  - + HL; last set of lipids: 09/14/2020: 166/145/61.4/76 Lab Results  Component Value Date   CHOL 164 07/13/2020   HDL 61 07/13/2020   LDLCALC 76 07/13/2020   TRIG 133 07/13/2020   CHOLHDL 2.7 07/13/2020  She is on Crestor 10 mg daily.  Managed by cardiology (Dr. Fletcher Anon).  - last eye exam 10/2020.  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 + IO injections.  - + numbness and tingling in her feet.  Pt has FH of DM in brother and grandson (DM1).  Latest TSH was normal, at 1.074 on 08/29/2018. She is on iron sulfate for anemia. She has a history of adrenal adenoma in 09.2019 >> c/w benign adenoma. She saw nephrology  for a renal cyst.  No h/o pancreatitis. No FH of MTC.  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 s/p Engineer, agricultural PPM in 03/2015   Bell's palsy    Bradycardia    CAD (coronary artery disease)    a. 02/2016 Cath: LM nl, LAD 30p, D1 small w/ severe ostial/mid dzs, LCX nl, RCA nl, EF <25%, glob HK.   Chronic combined systolic and diastolic CHF (congestive heart failure) ([place])    a.  Echo in 03/2015: EF 55-60%, Grade 1 DD;  b. 02/2016 Echo: 25-30%, mod LVH, Gr1 DD, mild MR, mildly dil LA, prominent apical trabeculations.   Diabetes ([place])    Hernia of abdominal wall    HTN (hypertension)    Hypertension    NICM (nonischemic cardiomyopathy) ([place])    a. 02/2016 relatively non-obstructive cath, EF 25-30% by echo.   Past Surgical History:  Procedure Laterality Date   CARDIAC CATHETERIZATION Bilateral 03/01/2016   Procedure: Left Heart Cath and Coronary Angiography;  Surgeon: Minna Merritts, MD;  Location: [place] [facility];  Service: Cardiovascular;  Laterality: Bilateral;   COLONOSCOPY WITH PROPOFOL N/A 05/06/2019   Procedure: COLONOSCOPY WITH PROPOFOL;  Surgeon: Toledo, Benay Pike, MD;  Location: [facility];  Service: Gastroenterology;  Laterality: N/A;   ESOPHAGOGASTRODUODENOSCOPY (EGD) WITH PROPOFOL N/A 05/06/2019   Procedure: ESOPHAGOGASTRODUODENOSCOPY (EGD) WITH PROPOFOL;  Surgeon: Toledo, Benay Pike, MD;  Location: [facility];  Service: Gastroenterology;  Laterality: N/A;   HERNIA REPAIR     INSERT / REPLACE / REMOVE PACEMAKER     PACEMAKER INSERTION     PACEMAKER INSERTION     Social History   Socioeconomic History   Marital status: Widowed    Spouse name: Not on file   Number of children: 5   Years of education: Not on file   Highest education level: Not on file  Occupational History   Not on file  Tobacco Use   Smoking status: Never   Smokeless tobacco: Never  Vaping Use   Vaping Use: Never used  Substance and Sexual Activity   Alcohol use: Not Currently   Drug use: No   Sexual activity: Not on file  Other Topics Concern   Not on file  Social History Narrative   Not on file   Social Determinants of Health   Financial Resource Strain: Not on file  Food Insecurity: Not on file  Transportation Needs: Not on file  Physical Activity: Not on file  Stress: Not on file  Social Connections: Not on file  Intimate Partner Violence: Not on file    Current Outpatient Medications on File Prior to Visit  Medication Sig Dispense Refill   amLODipine (NORVASC) 5 MG tablet Take 2.5 mg by mouth daily.     blood glucose meter kit and supplies by Other route as directed. Contour next Meter. Dispense based on patient and insurance preference. Use up to four times daily as directed. (FOR ICD-10 E10.9, E11.9).     carvedilol (COREG) 6.25 MG tablet Take 6.25 mg by mouth in the morning and at bedtime.     Cholecalciferol 25 MCG (1000 UT) tablet Take 2,000 Units by mouth daily.     Continuous Blood Gluc Sensor (FREESTYLE LIBRE 2 SENSOR) MISC 1 each by Does not apply route every 14 (fourteen) days. 6 each 3  ferrous sulfate 325 (65 FE) MG tablet Take 325 mg by mouth daily with breakfast.     Glucagon 3 MG/DOSE POWD Place 3 mg into the nose once as needed for up to 1 dose. 1 each 11   insulin glargine (LANTUS) 100 UNIT/ML injection Inject 35 Units into the skin daily.     insulin lispro (HUMALOG) 100 UNIT/ML injection Inject 12 Units into the skin 3 (three) times daily before meals.     Insulin Pen Needle 31G X 6 MM MISC Use 4 times daily 150 each 3   penicillin v potassium (VEETID) 500 MG tablet Take 1 tablet (500 mg total) by mouth 3 (three) times daily. 30 tablet 0   rosuvastatin (CRESTOR) 20 MG tablet Take 1 tablet (20 mg total) by mouth daily. 90 tablet 3   sacubitril-valsartan (ENTRESTO) 24-26 MG Take 1 tablet by mouth 2 (two) times daily. 180 tablet 0   Semaglutide,0.25 or 0.5MG /DOS, (OZEMPIC, 0.25 OR 0.5 MG/DOSE,) 2 MG/1.5ML SOPN Inject 0.5 mg into the skin once a week. 4.5 mL 3   torsemide (DEMADEX) 20 MG tablet TAKE 1 TABLET(20 MG) BY MOUTH DAILY 90 tablet 1   No current facility-administered medications on file prior to visit.   Allergies  Allergen Reactions   Bee Venom Anaphylaxis   Atorvastatin Other (See Comments)    Severe leg cramping   Family History  Problem Relation Age of Onset   Diabetes Mellitus I Grandchild    Stroke  Father    Diabetes Brother    Skin cancer Mother    Brain cancer Brother    PE: BP 130/76 (BP Location: Right Arm, Patient Position: Sitting, Cuff Size: Normal)   Pulse 78   Ht 5\' 2"  (1.575 m)   Wt 136 lb 9.6 oz (62 kg)   SpO2 97%   BMI 24.98 kg/m  Wt Readings from Last 3 Encounters:  05/16/21 136 lb 9.6 oz (62 kg)  03/31/21 140 lb 3.2 oz (63.6 kg)  02/10/21 145 lb (65.8 kg)   Constitutional: normal weight, in NAD Eyes: PERRLA, EOMI, no exophthalmos ENT: moist mucous membranes, no thyromegaly, no cervical lymphadenopathy Cardiovascular: RRR, No MRG Respiratory: CTA B Gastrointestinal: abdomen soft, NT, ND, BS+ Musculoskeletal: no deformities, strength intact in all 4 Skin: moist, warm, no rashes Neurological: no tremor with outstretched hands, DTR normal in all 4  ASSESSMENT: 1. DM2, insulin-dependent, uncontrolled, with long-term complications - CAD - nonischemic CHF - diabetic retinopathy - peripheral neuropathy - CKD stage IV with microalbuminuria  No family history of medullary thyroid cancer or personal history of pancreatitis.  2. HL  PLAN:  1. Patient with longstanding, uncontrolled, type 2 diabetes, basal-bolus insulin regimen to which we added a weekly GLP-1 receptor agonist at last visit.  At that time, sugars were better in the morning but increasing before lunch and before dinner and HbA1c was higher, at 9.4%.  We discussed about the importance of taking Humalog 15 minutes before meals but we did not change the insulin doses.  I did encourage her to continue to take the Humalog also before snacks. -At this visit, her sugars are high in the morning, they are at goal before lunch, sometimes very low in the afternoon when she is more active, more variable before dinner, and at goal after dinner.  We discussed about the fact that she will need to take less insulin before a meal if she plans to be active in the afternoon.  I advised her to start by taking  50% of the  dose.  If this is not enough, she can definitely increase it afterwards.  Also, she mentions that she is not taking the Humalog 15 minutes before a meal and I again strongly advised her to do so.  She was also not taking Humalog before her bedtime snack.  I did advise her to try to eliminate the snack (which is an orange for a banana) and move these right after dinner, however, if she does not eat them at bedtime, she will need to cover them with Humalog. - Otherwise, we can continue the same doses of Lantus and Ozempic.  She tolerates this well. -She lost her CGM receiver last night.  I sent another prescription to her pharmacy.  We also discussed about downloading the freestyle libre 2 on her phone again and trying to use this to read the sensor.  - I suggested to:  Patient Instructions  Please continue: - Ozempic 0.5 mg weekly - Lantus 34 units at bedtime  Change: - Humalog 15 min before a meal 8-12 units before a meal 4-5 units before a snack with >15 g of carbs.   If you plan to be active after a meal, reduce the mealtime Humalog dose by 50%.   Try to eliminate the snack at bedtime.  Try to get the CGM receiver.  Please return in 3 months.   - we checked her HbA1c: 9.7% (even higher) - ?  Unclear why, as it does not align with her blood sugars at home - advised to check sugars at different times of the day - 4x a day, rotating check times - advised for yearly eye exams >> she is UTD - return to clinic in 3 months  2. HL -Reviewed latest lipid panel from 07/2020 and fractions were at goal except for LDL which was slightly higher than our goal of less than 70: Lab Results  Component Value Date   CHOL 164 07/13/2020   HDL 61 07/13/2020   LDLCALC 76 07/13/2020   TRIG 133 07/13/2020   CHOLHDL 2.7 07/13/2020  -She continues on Crestor 10 mg daily without side effects  Philemon Kingdom, MD PhD [facility] Endocrinology

## 2021-06-26 ENCOUNTER — Other Ambulatory Visit: Payer: Self-pay | Admitting: Cardiovascular Disease

## 2021-06-26 DIAGNOSIS — I5022 Chronic systolic (congestive) heart failure: Secondary | ICD-10-CM

## 2021-08-03 ENCOUNTER — Ambulatory Visit (INDEPENDENT_AMBULATORY_CARE_PROVIDER_SITE_OTHER): Payer: Medicare Other

## 2021-08-03 DIAGNOSIS — I441 Atrioventricular block, second degree: Secondary | ICD-10-CM

## 2021-08-07 LAB — CUP PACEART REMOTE DEVICE CHECK
Battery Remaining Longevity: 84 mo
Battery Remaining Percentage: 81 %
Brady Statistic RA Percent Paced: 1 %
Brady Statistic RV Percent Paced: 100 %
Date Time Interrogation Session: 20221223160000
Implantable Lead Implant Date: 20160810
Implantable Lead Implant Date: 20160810
Implantable Lead Location: 753859
Implantable Lead Location: 753860
Implantable Lead Model: 7740
Implantable Lead Model: 7741
Implantable Lead Serial Number: 640659
Implantable Lead Serial Number: 682646
Implantable Pulse Generator Implant Date: 20160810
Lead Channel Impedance Value: 547 Ohm
Lead Channel Impedance Value: 768 Ohm
Lead Channel Setting Pacing Amplitude: 2 V
Lead Channel Setting Pacing Amplitude: 2.4 V
Lead Channel Setting Pacing Pulse Width: 0.5 ms
Lead Channel Setting Sensing Sensitivity: 2.5 mV
Pulse Gen Serial Number: 722311

## 2021-08-11 ENCOUNTER — Telehealth: Payer: Self-pay | Admitting: Internal Medicine

## 2021-08-11 DIAGNOSIS — E1165 Type 2 diabetes mellitus with hyperglycemia: Secondary | ICD-10-CM

## 2021-08-11 MED ORDER — INSULIN PEN NEEDLE 31G X 6 MM MISC: 3 refills | Status: AC

## 2021-08-11 NOTE — Telephone Encounter (Signed)
MEDICATION: Insulin Pen Needle 31G X 6 MM MISC  PHARMACY:   [id number] [address], [address] AT [place] Phone:  [phone]  Fax:  [phone]      HAS THE PATIENT CONTACTED THEIR PHARMACY?  YES  IS THIS A 90 DAY SUPPLY : YES  IS PATIENT OUT OF MEDICATION: 1 LEFT  IF NOT; HOW MUCH IS LEFT:   LAST APPOINTMENT DATE: @10 /11/2020  NEXT APPOINTMENT DATE:@1 /12/2021  DO WE HAVE YOUR PERMISSION TO LEAVE A DETAILED MESSAGE?:  OTHER COMMENTS:    **Let patient know to contact pharmacy at the end of the day to make sure medication is ready. **  ** Please notify patient to allow 48-72 hours to process**  **Encourage patient to contact the pharmacy for refills or they can request refills through [facility]**

## 2021-08-11 NOTE — Telephone Encounter (Signed)
Rx sent to preferred pharmacy.

## 2021-08-15 NOTE — Progress Notes (Signed)
Remote pacemaker transmission.   

## 2021-08-16 ENCOUNTER — Telehealth: Payer: Self-pay | Admitting: Internal Medicine

## 2021-08-16 MED ORDER — AMLODIPINE BESYLATE 5 MG PO TABS: 2.5000 mg | ORAL_TABLET | Freq: Every day | ORAL | 0 refills | Status: DC

## 2021-08-16 NOTE — Telephone Encounter (Signed)
Spoke with pt who reports Dr Caryl Comes stopped her Amlodipine at her last visit.  Pt advised according to Dr Olin Pia note he did not stop Amlodipine but rather decreased Amlodipine from 5mg  to 2.5mg  (1/2 tablet)  Pt states she has not been taking Amlodipine since August 2022 when she last saw Dr Caryl Comes.  Pt states her daughter threw away her most recent refill and the pharmacy and insurance company will not authorize a refill. Pt reports BP has been elevated over the past 3 days with today's reading of 170/101.  She does not have other readings with her.  She continues to take [facility] as prescribed.  She denies additional symptoms at this time.  Pt advised Dr Caryl Comes is not the prescribe of Amlodipine as we have listed historical provider and that it was last ordered on 08/2018.  Requested pt check to see who prescriber of Amlodipine is and last fill date.  Pt to also retreive her last 3 BP readings.  Pt  verbalizes understanding and will call back with this information.

## 2021-08-16 NOTE — Telephone Encounter (Signed)
Patient calling back with Readings   170/100  183/112 156/100  Patient wants to know what to do about meds .

## 2021-08-16 NOTE — Telephone Encounter (Signed)
Attempted phone call to pt and left voicemail to contact RN at [phone]. 

## 2021-08-16 NOTE — Telephone Encounter (Signed)
Patient called stating that Dr. Caryl Comes took her off of her BP medication.  Since she wasn't taken off of them her daughter tossed them. However, her BP is now high.  She needs a script for BP medication.  She tried to fill Amoldipine but her insurance told her it was too soon to fill it.  So she needs a script for something else.

## 2021-08-16 NOTE — Telephone Encounter (Signed)
Spoke with pt who reports her BP readings are from this morning and yesterday.  Pt states her Amlodipine was originally prescribed by a Dr Tamala Julian.  She denies current CP, SOB, edema, dizziness, HA or weakness.  Pt reports her BP has remained on the low/normal side until this week.  She states she has not been following a low sodium diet and her she is currently under a great deal of stress due to her 69 year old son having a heart attack on New Year's. Per Dr Olin Pia last note 03/2021 pt was to decrease Amlodipine from 5mg  to 2.5mg  daily but instead stopped medication. Refill sent to pharmacy for Amlodipine 5mg  #15/0RF - take 1/2 tablet (2.5mg ) daily.  Provided education on following a low sodium diet, stress management and monitoring BP.  Pt states she will implement strategies discussed and will notify if BP drops below 100/60 or she develops any of the above symptoms. Pt advised to contact her general cardiologist ASAP to schedule appointment for f/u re: BP.  Pt is scheduled to see her endocrinologist 08/17/2021 and nephrology 11/2021.  Pt verbalized understanding of all instructions given and agrees with current plan.

## 2021-08-17 ENCOUNTER — Ambulatory Visit (INDEPENDENT_AMBULATORY_CARE_PROVIDER_SITE_OTHER): Payer: Medicare Other | Admitting: Internal Medicine

## 2021-08-17 ENCOUNTER — Encounter: Payer: Self-pay | Admitting: Internal Medicine

## 2021-08-17 ENCOUNTER — Other Ambulatory Visit: Payer: Self-pay

## 2021-08-17 VITALS — BP 128/80 | HR 71 | Ht 62.0 in | Wt 150.6 lb

## 2021-08-17 DIAGNOSIS — E785 Hyperlipidemia, unspecified: Secondary | ICD-10-CM

## 2021-08-17 DIAGNOSIS — E1159 Type 2 diabetes mellitus with other circulatory complications: Secondary | ICD-10-CM

## 2021-08-17 DIAGNOSIS — E1165 Type 2 diabetes mellitus with hyperglycemia: Secondary | ICD-10-CM

## 2021-08-17 LAB — POCT GLYCOSYLATED HEMOGLOBIN (HGB A1C): Hemoglobin A1C: 6.8 % — AB (ref 4.0–5.6)

## 2021-08-17 MED ORDER — LANTUS SOLOSTAR 100 UNIT/ML ~~LOC~~ SOPN: 30.0000 [IU] | PEN_INJECTOR | Freq: Every day | SUBCUTANEOUS | 3 refills | Status: DC

## 2021-08-17 MED ORDER — OZEMPIC (0.25 OR 0.5 MG/DOSE) 2 MG/1.5ML ~~LOC~~ SOPN: 0.5000 mg | PEN_INJECTOR | SUBCUTANEOUS | 3 refills | Status: DC

## 2021-08-17 MED ORDER — INSULIN LISPRO (1 UNIT DIAL) 100 UNIT/ML (KWIKPEN): 4.0000 [IU] | PEN_INJECTOR | Freq: Three times a day (TID) | SUBCUTANEOUS | 3 refills | Status: DC

## 2021-08-17 NOTE — Patient Instructions (Addendum)
Please increase: - Ozempic 0.5 mg weekly  Decrease: - Lantus 30 units at bedtime - Humalog 15 min before a meal 4 units before b'fast 8 units before lunch 4 units before dinner  If you plan to be active after a meal, reduce the mealtime Humalog dose by 50%.  Please return in 3-4 months.

## 2021-08-17 NOTE — Telephone Encounter (Signed)
Attempted to schedule. Lmov  

## 2021-08-17 NOTE — Progress Notes (Signed)
Patient ID: Bethany Gillespie, female   DOB: [birth year], 71 y.o.   MRN: [id number]   This visit occurred during the SARS-CoV-2 public health emergency.  Safety protocols were in place, including screening questions prior to the visit, additional usage of staff PPE, and extensive cleaning of exam room while observing appropriate contact time as indicated for disinfecting solutions.   HPI: Bethany Gillespie is a 71 y.o.-year-old female, initially referred by her previous endocrinologist, Dr. Gabriel Carina, returning for follow-up for DM2, dx in 2000s, insulin-dependent  - started several years later, uncontrolled, with complications (CAD, nonischemic CHF, diabetic retinopathy, peripheral neuropathy, CKD stage IV with microalbuminuria).  She was previously seeing Dr. Gabriel Carina but cannot drive to her office anymore due to her worsening vision she had to establish care with an endocrinologist in town.  Our last visit was 3 months ago  Interim hx: No increased urination, blurry vision, nausea, chest pain. She has blurry vision (macular degeneration). She tells me that since last visit, she changed the dose of Ozempic to 8 clicks on the Ozempic pen (?) at the advice of a friend. She had increased stress at home since last visit.  She is currently living with her daughter and her children.  Son was recently sick, in the hospital.  Reviewed HbA1c levels: Lab Results  Component Value Date   HGBA1C 9.7 (A) 05/16/2021   HGBA1C 9.4 (A) 02/10/2021   HGBA1C 8.9 (A) 11/03/2020   HGBA1C 12.9 (H) 02/28/2016   HGBA1C 12.0 (H) 02/27/2016   HGBA1C 9.7 (H) 02/25/2014  12/2019: HbA1c 8.9%  Pt is on a regimen of: - Ozempic 0.25 >> 0.5 mg weekly - added 02/2021 - tolerates it well - taking less (8 clicks) - Lantus 40 >> 35 units at bedtime - Humalog 12 units 3x a day, right before meals >> Taking 8-12 >> 4-8 units before meal: Prev. On Metformin and Actos.  Pt checks her sugars more than 4 times a day with her freestyle libre  CGM:   Prev.: - am: 78-150 >> 150-199 - 2h after b'fast: ? - lunch: 175-200 >> 100-125 - 2h after lunch: LO (if active) - dinner: 175-200 >> 100-199 - 2h after dinner: <200s >> 80-150 - bedtime: n/c - snack.  Previously:   Lowest sugar was LO >> 70s >> LO (after lunch) >> 45 (CGM); she has hypoglycemia awareness at 60s.  Highest sugar was 338 (overcompensation) >> 200s >> 225 (after a low) >> 300s.  Pt's meals are: - Breakfast: oatmeal + berries, yoghurt - Lunch: salad or cheese + crackers, soup - Dinner: meat + veggies or starch - Snacks: 3: nuts, popcorn  - + CKD with microalbuminuria - Dr. Candiss Norse, last BUN/creatinine:  05/16/2019: 39/217, GFR 23, glucose 161, protein to creatinine ratio 0.156 (0.024-0.184) 09/14/2020: 20/1.9, GFR 26, glucose 174 05/24/2020: 25/2.1 glucose 118 Lab Results  Component Value Date   BUN 26 (H) 12/27/2016   BUN 14 05/21/2016   CREATININE 1.84 (H) 10/20/2018   CREATININE 1.90 (H) 04/15/2018  She is on Entresto twice a day.  - + HL; last set of lipids: 09/14/2020: 166/145/61.4/76 Lab Results  Component Value Date   CHOL 164 07/13/2020   HDL 61 07/13/2020   LDLCALC 76 07/13/2020   TRIG 133 07/13/2020   CHOLHDL 2.7 07/13/2020  She is on Crestor 10 mg daily.  Managed by cardiology (Dr. Fletcher Anon).  - last eye exam 10/2020.  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 + IO injections.  - +  numbness and tingling in her feet.  Pt has FH of DM in brother and grandson (DM1).  Latest TSH was normal, at 1.074 on 08/29/2018. No results found for: TSH She is on iron sulfate for anemia. She has a history of adrenal adenoma in 04/2018 >> c/w benign adenoma. She saw nephrology for a renal cyst.  No h/o pancreatitis. No FH of MTC.  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 s/p Engineer, agricultural PPM in 03/2015   Bell's palsy    Bradycardia    CAD (coronary artery disease)    a. 02/2016 Cath: LM nl, LAD 30p, D1 small w/ severe ostial/mid dzs, LCX nl, RCA nl, EF <25%, glob HK.   Chronic combined systolic and diastolic CHF (congestive heart failure) ([place])    a. Echo in 03/2015: EF 55-60%, Grade 1 DD;  b. 02/2016 Echo: 25-30%, mod LVH, Gr1 DD, mild MR, mildly dil LA, prominent apical trabeculations.   Diabetes ([place])    Hernia of abdominal wall    HTN (hypertension)    Hypertension    NICM (nonischemic cardiomyopathy) ([place])    a. 02/2016 relatively non-obstructive cath, EF 25-30% by echo.   Past Surgical History:  Procedure Laterality Date   CARDIAC CATHETERIZATION Bilateral 03/01/2016   Procedure: Left Heart Cath and Coronary Angiography;  Surgeon: Minna Merritts, MD;  Location: [facility];  Service: Cardiovascular;  Laterality: Bilateral;   COLONOSCOPY WITH PROPOFOL N/A 05/06/2019   Procedure: COLONOSCOPY WITH PROPOFOL;  Surgeon: Toledo, Benay Pike, MD;  Location: [facility];  Service: Gastroenterology;  Laterality: N/A;   ESOPHAGOGASTRODUODENOSCOPY (EGD) WITH PROPOFOL N/A 05/06/2019   Procedure: ESOPHAGOGASTRODUODENOSCOPY (EGD) WITH PROPOFOL;  Surgeon: Toledo, Benay Pike, MD;  Location: [facility];  Service: Gastroenterology;  Laterality: N/A;   HERNIA REPAIR     INSERT / REPLACE / REMOVE PACEMAKER     PACEMAKER INSERTION     PACEMAKER INSERTION     Social History   Socioeconomic History   Marital status: Widowed    Spouse name: Not on file   Number of children: 5   Years of education: Not on file   Highest education level: Not on file  Occupational History   Not on file  Tobacco Use   Smoking status: Never   Smokeless tobacco: Never  Vaping Use   Vaping Use: Never used  Substance and Sexual Activity   Alcohol use: Not Currently   Drug use: No   Sexual activity: Not on file  Other Topics Concern   Not  on file  Social History Narrative   Not on file   Social Determinants of Health   Financial Resource Strain: Not on file  Food Insecurity: Not on file  Transportation Needs: Not on file  Physical Activity: Not on file  Stress: Not on file  Social Connections: Not on file  Intimate Partner Violence: Not on file   Current Outpatient Medications on File Prior to Visit  Medication Sig Dispense Refill   amLODipine (NORVASC) 5 MG tablet Take 0.5 tablets (2.5 mg total) by mouth daily. 15 tablet 0   blood glucose meter kit and supplies by Other route as directed. Contour next Meter. Dispense based on patient and insurance preference. Use up to four times daily as directed. (FOR ICD-10 E10.9, E11.9).     carvedilol (  COREG) 6.25 MG tablet Take 6.25 mg by mouth in the morning and at bedtime. (Patient not taking: Reported on 05/16/2021)     Cholecalciferol 25 MCG (1000 UT) tablet Take 2,000 Units by mouth daily.     Continuous Blood Gluc Receiver (FREESTYLE LIBRE 2 READER) DEVI 1 each by Does not apply route daily. 1 each 0   Continuous Blood Gluc Sensor (FREESTYLE LIBRE 2 SENSOR) MISC 1 each by Does not apply route every 14 (fourteen) days. 6 each 3   ENTRESTO 24-26 MG TAKE 1 TABLET BY MOUTH TWICE DAILY 180 tablet 0   ferrous sulfate 325 (65 FE) MG tablet Take 325 mg by mouth daily with breakfast.     Glucagon 3 MG/DOSE POWD Place 3 mg into the nose once as needed for up to 1 dose. 1 each 11   insulin glargine (LANTUS) 100 UNIT/ML injection Inject 35 Units into the skin daily.     insulin lispro (HUMALOG) 100 UNIT/ML injection Inject 12 Units into the skin 3 (three) times daily before meals.     Insulin Pen Needle 31G X 6 MM MISC Use 4 times daily 150 each 3   penicillin v potassium (VEETID) 500 MG tablet Take 1 tablet (500 mg total) by mouth 3 (three) times daily. 30 tablet 0   rosuvastatin (CRESTOR) 20 MG tablet Take 1 tablet (20 mg total) by mouth daily. 90 tablet 3   Semaglutide,0.25 or  0.5MG/DOS, (OZEMPIC, 0.25 OR 0.5 MG/DOSE,) 2 MG/1.5ML SOPN Inject 0.5 mg into the skin once a week. 4.5 mL 3   torsemide (DEMADEX) 20 MG tablet TAKE 1 TABLET(20 MG) BY MOUTH DAILY (Patient not taking: Reported on 05/16/2021) 90 tablet 1   No current facility-administered medications on file prior to visit.   Allergies  Allergen Reactions   Bee Venom Anaphylaxis   Atorvastatin Other (See Comments)    Severe leg cramping   Family History  Problem Relation Age of Onset   Diabetes Mellitus I Grandchild    Stroke Father    Diabetes Brother    Skin cancer Mother    Brain cancer Brother    PE: BP 128/80 (BP Location: Right Arm, Patient Position: Sitting, Cuff Size: Normal)    Pulse 71    Ht _0  (1.575 m)    Wt 150 lb 9.6 oz (68.3 kg)    SpO2 98%    BMI 27.55 kg/m  Wt Readings from Last 3 Encounters:  08/17/21 150 lb 9.6 oz (68.3 kg)  05/16/21 136 lb 9.6 oz (62 kg)  03/31/21 140 lb 3.2 oz (63.6 kg)   Constitutional: normal weight, in NAD Eyes: PERRLA, EOMI, no exophthalmos ENT: moist mucous membranes, no thyromegaly, no cervical lymphadenopathy Cardiovascular: RRR, No MRG Respiratory: CTA B Musculoskeletal: no deformities, strength intact in all 4 Skin: moist, warm, no rashes, coarse skin on knuckles Neurological: no tremor with outstretched hands, DTR normal in all 4  ASSESSMENT: 1. DM2, insulin-dependent, uncontrolled, with long-term complications - CAD - nonischemic CHF - diabetic retinopathy - peripheral neuropathy - CKD stage IV with microalbuminuria  No family history of medullary thyroid cancer or personal history of pancreatitis.  2. HL  PLAN:  1. Patient with longstanding, controlled, type 2 diabetes, on basal-bolus insulin regimen, to which we added a weekly GLP-1 receptor agonist in 02/2019.  At that time, sugars were high in the morning, they were at goal before lunch and sometimes very low in the afternoon when she was more active and they were at  goal after  dinner.  We discussed about taking less insulin if she plans to be more in the afternoon.  We also discussed about possibly eliminating the bedtime snack.  HbA1c was still 9.7%, but this was higher than expected from her blood sugars at home. CGM interpretation: -At today's visit, we reviewed her CGM downloads: It appears that 78% of values are in target range (goal >70%), while 40% are higher than 180 (goal <25%), and 8% are lower than 70 (goal <4%).  The calculated average blood sugar is 128.  The projected HbA1c for the next 3 months ([facility]MI) is 6.4%. -Reviewing the CGM trends, it appears that sugars are controlled and even below for the majority of the day, but they are higher after her late lunch.  Reviewing individual CGM tracings, she has frequent low blood sugars in the 50s and 60s and even occasion throughout the day.  Therefore, I advised her to decrease the dose of Lantus from 35 to 30 units.  Also, I advised her to increase the dose of Ozempic, since now she is taking a very low-dose despite tolerating it well.  I advised her to increase it to 0.25 mg weekly x1 and then to 0.5 mg weekly.  With this, I expect that she will require even less mealtime insulin so we decreased the doses before breakfast and dinner and did not increase the dose before lunch despite a higher blood sugars after this meal.  I advised her that we may deliver lower doses of insulin after she increases Ozempic. - I suggested to:  Patient Instructions  Please increase: - Ozempic 0.5 mg weekly  Decrease: - Lantus 30 units at bedtime - Humalog 15 min before a meal 4 units before b'fast 8 units before lunch 4 units before dinner  If you plan to be active after a meal, reduce the mealtime Humalog dose by 50%.  Please return in 3-4 months.   - we checked her HbA1c: 6.8% (MUCH better) - advised to check sugars at different times of the day - 4x a day, rotating check times - advised for yearly eye exams >> she is UTD -  return to clinic in 3-4 months  2. HL -Reviewed latest lipid panel from 07/2020: Fractions at goal with the exception of a slightly high LDL: Lab Results  Component Value Date   CHOL 164 07/13/2020   HDL 61 07/13/2020   LDLCALC 76 07/13/2020   TRIG 133 07/13/2020   CHOLHDL 2.7 07/13/2020  -Continues Crestor 10 mg daily without side effects -She is due for another lipid panel -has an appointment coming up with PCP.  Philemon Kingdom, MD PhD [facility] Endocrinology

## 2021-09-11 ENCOUNTER — Other Ambulatory Visit: Payer: Self-pay | Admitting: Registered Nurse

## 2021-09-11 DIAGNOSIS — E2839 Other primary ovarian failure: Secondary | ICD-10-CM

## 2021-10-02 ENCOUNTER — Other Ambulatory Visit: Payer: Self-pay | Admitting: Cardiovascular Disease

## 2021-10-02 DIAGNOSIS — I5022 Chronic systolic (congestive) heart failure: Secondary | ICD-10-CM

## 2021-10-02 NOTE — Telephone Encounter (Signed)
Please schedule overdue F/U appointment with Dr. Fletcher Anon for 90 day refills. Thank you!

## 2021-10-02 NOTE — Telephone Encounter (Signed)
LVM to schedule

## 2021-10-11 ENCOUNTER — Other Ambulatory Visit: Payer: Self-pay

## 2021-10-11 ENCOUNTER — Ambulatory Visit (INDEPENDENT_AMBULATORY_CARE_PROVIDER_SITE_OTHER): Payer: Medicare Other | Admitting: Medical

## 2021-10-11 ENCOUNTER — Encounter: Payer: Self-pay | Admitting: Medical

## 2021-10-11 VITALS — BP 122/62 | HR 68 | Ht 62.0 in | Wt 150.0 lb

## 2021-10-11 DIAGNOSIS — I2510 Atherosclerotic heart disease of native coronary artery without angina pectoris: Secondary | ICD-10-CM

## 2021-10-11 DIAGNOSIS — E785 Hyperlipidemia, unspecified: Secondary | ICD-10-CM | POA: Diagnosis not present

## 2021-10-11 DIAGNOSIS — I441 Atrioventricular block, second degree: Secondary | ICD-10-CM | POA: Diagnosis not present

## 2021-10-11 DIAGNOSIS — N184 Chronic kidney disease, stage 4 (severe): Secondary | ICD-10-CM

## 2021-10-11 DIAGNOSIS — I5022 Chronic systolic (congestive) heart failure: Secondary | ICD-10-CM

## 2021-10-11 MED ORDER — CARVEDILOL 3.125 MG PO TABS: 3.1250 mg | ORAL_TABLET | Freq: Two times a day (BID) | ORAL | 3 refills | Status: DC

## 2021-10-11 NOTE — Patient Instructions (Signed)
Medication Instructions:  ?Your physician has recommended you make the following change in your medication:  ? ?DECREASE carvedilol (Coreg) to 3.125 mg twice daily  ? ?*If you need a refill on your cardiac medications before your next appointment, please call your pharmacy* ? ? ?Lab Work: ?None ordered ? ?If you have labs (blood work) drawn today and your tests are completely normal, you will receive your results only by: ?MyChart Message (if you have MyChart) OR ?A paper copy in the mail ?If you have any lab test that is abnormal or we need to change your treatment, we will call you to review the results. ? ? ?Testing/Procedures: ?None ordered ? ? ?Follow-Up: ?At [facility], you and your health needs are our priority.  As part of our continuing mission to provide you with exceptional heart care, we have created designated Provider Care Teams.  These Care Teams include your primary Cardiologist (physician) and Advanced Practice Providers (APPs -  Physician Assistants and Nurse Practitioners) who all work together to provide you with the care you need, when you need it. ? ?We recommend signing up for the patient portal called "MyChart".  Sign up information is provided on this After Visit Summary.  MyChart is used to connect with patients for Virtual Visits (Telemedicine).  Patients are able to view lab/test results, encounter notes, upcoming appointments, etc.  Non-urgent messages can be sent to your provider as well.   ?To learn more about what you can do with MyChart, go to [email or web address].   ? ?Your next appointment:   ?6 month(s) ? ?The format for your next appointment:   ?In Person ? ?Provider:   ?You may see Kathlyn Sacramento, MD or one of the following Advanced Practice Providers on your designated Care Team:   ?Murray Hodgkins, NP ?Christell Faith, PA-C ?Cadence Kathlen Mody, PA-C ? ? ?Other Instructions ?N/A ?

## 2021-10-11 NOTE — Progress Notes (Signed)
Cardiology Office Note:    Date:  10/11/2021   ID:  Tory Emerald, DOB 1951/01/07, MRN [id number]  PCP:  Arthur Holms, NP  [facility] Cardiologist:  Kathlyn Sacramento, MD  [facility] Electrophysiologist:  None   Referring MD: Arthur Holms, NP   Chief Complaint: 6-8 month follow-up  History of Present Illness:    Bethany Gillespie is a 71 y.o. female with a hx of  chronic systolic heart failure secondary to an ICM, nonobstructive coronary artery disease, PPM, DM2, HTN presents today for follow-up.  PPM placed August 2016 in secondary to second-degree heart block.[name] Her heart failure was initially diagnosed July 2017 with EF 25 to 30%.  Cardiac catheterization with small vessel coronary artery disease with nonobstructive disease noted in main arteries.  Improvement in EF on medical therapy with echo 02/2017 with EF 50 to 55%.  Seen 11/02/20 and was overall doing well. Crestor was increased  Saw Dr. Caryl Comes 03/2021 and amlodipine was decreased from 5 to 2.106m daily. She was euvolemic.   Today, the patient reports 3 weeks ago her son had cardiac arrest and her BP went really high, so she restarted coreg and amlodipine 2.581mdaily. Otherwise she has been doing well since then. No chest pain or SOB. No LLE, orthopnea, pnd. She takes torsemide as needed.    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 s/p BoEngineer, agriculturalPM in 03/2015   Bell's palsy    Bradycardia    CAD (coronary artery disease)    a. 02/2016 Cath: LM nl, LAD 30p, D1 small w/ severe ostial/mid dzs, LCX nl, RCA nl, EF <25%, glob HK.   Chronic combined systolic and diastolic CHF (congestive heart failure) ([place]   a. Echo in 03/2015: EF 55-60%, Grade 1 DD;  b. 02/2016 Echo: 25-30%, mod LVH, Gr1 DD, mild MR, mildly dil LA, prominent apical trabeculations.   Diabetes ([place]   Hernia of abdominal wall    HTN (hypertension)    Hypertension    [place]IC[place] ([place]ischemic cardiomyopathy) ([place]   a.  02/2016 relatively non-obstructive cath, EF 25-30% by echo.    Past Surgical History:  Procedure Laterality Date   CARDIAC CATHETERIZATION Bilateral 03/01/2016   Procedure: Left Heart Cath and Coronary Angiography;  Surgeon: TiMinna MerrittsMD;  [facility];  Service: Cardiovascular;  Laterality: Bilateral;   COLONOSCOPY WITH PROPOFOL N/A 05/06/2019   Procedure: COLONOSCOPY WITH PROPOFOL;  Surgeon: Toledo, TeBenay PikeMD;  Location: [facility] ENDOSCOPY;  Service: Gastroenterology;  Laterality: N/A;   ESOPHAGOGASTRODUODENOSCOPY (EGD) WITH PROPOFOL N/A 05/06/2019   Procedure: ESOPHAGOGASTRODUODENOSCOPY (EGD) WITH PROPOFOL;  Surgeon: Toledo, TeBenay PikeMD;  Location: [facility] ENDOSCOPY;  Service: Gastroenterology;  Laterality: N/A;   HERNIA REPAIR     INSERT / REPLACE / REMOVE PACEMAKER     PACEMAKER INSERTION     PACEMAKER INSERTION      Current Medications: Current Meds  Medication Sig   amLODipine (NORVASC) 5 MG tablet Take 0.5 tablets (2.5 mg total) by mouth daily.   blood glucose meter kit and supplies by Other route as directed. Contour next Meter. Dispense based on patient and insurance preference. Use up to four times daily as directed. (FOR ICD-10 E10.9, E11.9).   carvedilol (COREG) 3.125 MG tablet Take 1 tablet (3.125 mg total) by mouth 2 (two) times daily with a meal.   Cholecalciferol 25 MCG (1000 UT) tablet Take 2,000 Units by mouth daily.  Continuous Blood Gluc Receiver (FREESTYLE LIBRE 2 READER) DEVI 1 each by Does not apply route daily.   Continuous Blood Gluc Sensor (FREESTYLE LIBRE 2 SENSOR) MISC 1 each by Does not apply route every 14 (fourteen) days.   ferrous sulfate 325 (65 FE) MG tablet Take 325 mg by mouth daily with breakfast.   Glucagon 3 MG/DOSE POWD Place 3 mg into the nose once as needed for up to 1 dose.   insulin glargine (LANTUS SOLOSTAR) 100 UNIT/ML Solostar Pen Inject 30 Units into the skin daily.   Insulin Pen Needle 31G X 6 MM MISC Use 4 times  daily   rosuvastatin (CRESTOR) 20 MG tablet Take 1 tablet (20 mg total) by mouth daily.   sacubitril-valsartan (ENTRESTO) 24-26 MG Take 1 tablet by mouth 2 (two) times daily. PLEASE KEEP SCHEDULED APPOINTMENT FOR FURTHER REFILLS   Semaglutide,0.25 or 0.5MG/DOS, (OZEMPIC, 0.25 OR 0.5 MG/DOSE,) 2 MG/1.5ML SOPN Inject 0.5 mg into the skin once a week.   torsemide (DEMADEX) 20 MG tablet TAKE 1 TABLET(20 MG) BY MOUTH DAILY   [DISCONTINUED] carvedilol (COREG) 6.25 MG tablet Take 6.25 mg by mouth 2 (two) times daily with a meal.     Allergies:   Bee venom and Atorvastatin   Social History   Socioeconomic History   Marital status: Widowed    Spouse name: Not on file   Number of children: 5   Years of education: Not on file   Highest education level: Not on file  Occupational History   Not on file  Tobacco Use   Smoking status: Never   Smokeless tobacco: Never  Vaping Use   Vaping Use: Never used  Substance and Sexual Activity   Alcohol use: Not Currently   Drug use: No   Sexual activity: Not on file  Other Topics Concern   Not on file  Social History Narrative   Not on file   Social Determinants of Health   Financial Resource Strain: Not on file  Food Insecurity: Not on file  Transportation Needs: Not on file  Physical Activity: Not on file  Stress: Not on file  Social Connections: Not on file     Family History: The patient's family history includes Brain cancer in her brother; Diabetes in her brother; Diabetes Mellitus I in her grandchild; Skin cancer in her mother; Stroke in her father.  ROS:   Please see the history of present illness.     All other systems reviewed and are negative.  EKGs/Labs/Other Studies Reviewed:    The following studies were reviewed today:  Echo 2021  1. Left ventricular ejection fraction, by estimation, is 50 to 55%. The  left ventricle has low normal function. Left ventricular endocardial  border not optimally defined to evaluate  regional wall motion. There is  moderate left ventricular hypertrophy.  Left ventricular diastolic parameters are consistent with Grade I  diastolic dysfunction (impaired relaxation).   2. Right ventricular systolic function is normal. The right ventricular  size is mildly enlarged. Mildly increased right ventricular wall  thickness. There is normal pulmonary artery systolic pressure.   3. Left atrial size was mildly dilated.   4. The mitral valve is grossly normal. Trivial mitral valve  regurgitation.   5. The aortic valve was not well visualized. Aortic valve regurgitation  is not visualized. No aortic stenosis is present.   6. The inferior vena cava is normal in size with <50% respiratory  variability, suggesting right atrial pressure of 8 mmHg.  Echo 2018 Study Conclusions   - Left ventricle: The cavity size was normal. There was mild    concentric hypertrophy. Systolic function was normal. The    estimated ejection fraction was in the range of 50% to 55%. Wall    motion was normal; Anterospetal wall motion consistent with    conduction abnormality. Doppler parameters are consistent with    abnormal left ventricular relaxation (grade 1 diastolic    dysfunction).  - Left atrium: The atrium was normal in size.  - Right ventricle: Systolic function was normal.  - Pulmonary arteries: Systolic pressure was within the normal    range.   Cardiac cath 2017 Prox LAD lesion, 30% stenosed. 1st Diag-1 lesion, 90% stenosed. 1st Diag-2 lesion, 80% stenosed.   Echo 2017 Study Conclusions   - Left ventricle: The cavity size was normal. Wall thickness was    increased in a pattern of moderate LVH. There was mild concentric    hypertrophy. Systolic function was severely reduced. The    estimated ejection fraction was in the range of 25% to 30%.    Diffuse hypokinesis. Doppler parameters are consistent with    abnormal left ventricular relaxation (grade 1 diastolic    dysfunction).  -  Mitral valve: There was mild regurgitation.  - Left atrium: The atrium was mildly dilated.  - Pulmonary arteries: Systolic pressure was within the normal    range.  - Pericardium, extracardiac: There was a left pleural effusion.   Impressions:   - Prominent apical trabeculations.     EKG:  EKG is ordered today.  The ekg ordered today demonstrates A sensed V paced, 68bpm, no changes  Recent Labs: No results found for requested labs within last 8760 hours.  Recent Lipid Panel    Component Value Date/Time   CHOL 164 07/13/2020 1428   TRIG 133 07/13/2020 1428   HDL 61 07/13/2020 1428   CHOLHDL 2.7 07/13/2020 1428   VLDL 27 07/13/2020 1428   LDLCALC 76 07/13/2020 1428     Physical Exam:    VS:  BP 122/62 (BP Location: Left Arm, Patient Position: Sitting, Cuff Size: Normal)    Pulse 68    Ht _0  (1.575 m)    Wt 150 lb (68 kg)    SpO2 98%    BMI 27.44 kg/m     Wt Readings from Last 3 Encounters:  10/11/21 150 lb (68 kg)  08/17/21 150 lb 9.6 oz (68.3 kg)  05/16/21 136 lb 9.6 oz (62 kg)     GEN:  Well nourished, well developed in no acute distress HEENT: Normal NECK: No JVD; No carotid bruits LYMPHATICS: No lymphadenopathy CARDIAC: RRR, no murmurs, rubs, gallops RESPIRATORY:  Clear to auscultation without rales, wheezing or rhonchi  ABDOMEN: Soft, non-tender, non-distended MUSCULOSKELETAL:  No edema; No deformity  SKIN: Warm and dry NEUROLOGIC:  Alert and oriented x 3 PSYCHIATRIC:  Normal affect   ASSESSMENT:    1. Chronic systolic heart failure ([place])   2. AV block, Mobitz 2   3. Nonobstructive atherosclerosis of coronary artery   4. Hyperlipidemia LDL goal <70   5. Chronic kidney disease (CKD), stage IV (severe) (HCC)    PLAN:    In order of problems listed above:  Chronic systolic heart failure NICM Most recent echo showed LVEF 50-55%. Euvolemic on exam with Torsemide PRN. Continue Coreg and Entresto.   CKD stage IV She follows with nephrology. Has  follow-up in April.  HTN BP elevated in the son having  cardiac arrest 3 weeks ago and she restarted 1/2 coreg doe and amlodipine 2.70m BID. I will send in Coreg 3.12,570mBID prescription.   Second degree AV block s/p PPM She is followed by EP.   HLD LDL 76 12/021. Needs updated lipid profile since increasing Crestor. Continue Crestor 2074maily.   Disposition: Follow up in 6 month(s) with MD/APP    Signed, Jelene Albano H FNinfa MeekerA-C  10/11/2021 4:37 PM    [facility]

## 2021-10-21 ENCOUNTER — Emergency Department (HOSPITAL_COMMUNITY): Payer: Medicare Other

## 2021-10-21 ENCOUNTER — Encounter (HOSPITAL_COMMUNITY): Payer: Self-pay

## 2021-10-21 ENCOUNTER — Emergency Department (HOSPITAL_COMMUNITY)
Admission: EM | Admit: 2021-10-21 | Discharge: 2021-10-21 | Disposition: A | Discharge status: Home / Self Care | Payer: Medicare Other | Attending: Emergency Medicine | Admitting: Emergency Medicine

## 2021-10-21 ENCOUNTER — Other Ambulatory Visit: Payer: Self-pay

## 2021-10-21 DIAGNOSIS — R42 Dizziness and giddiness: Secondary | ICD-10-CM | POA: Diagnosis present

## 2021-10-21 DIAGNOSIS — Z20822 Contact with and (suspected) exposure to covid-19: Secondary | ICD-10-CM | POA: Diagnosis not present

## 2021-10-21 DIAGNOSIS — Z794 Long term (current) use of insulin: Secondary | ICD-10-CM | POA: Diagnosis not present

## 2021-10-21 DIAGNOSIS — Z79899 Other long term (current) drug therapy: Secondary | ICD-10-CM | POA: Diagnosis not present

## 2021-10-21 DIAGNOSIS — R55 Syncope and collapse: Secondary | ICD-10-CM | POA: Diagnosis not present

## 2021-10-21 LAB — COMPREHENSIVE METABOLIC PANEL
ALT: 16 U/L (ref 0–44)
AST: 11 U/L — ABNORMAL LOW (ref 15–41)
Albumin: 4.2 g/dL (ref 3.5–5.0)
Alkaline Phosphatase: 70 U/L (ref 38–126)
Anion gap: 9 (ref 5–15)
BUN: 47 mg/dL — ABNORMAL HIGH (ref 8–23)
CO2: 23 mmol/L (ref 22–32)
Calcium: 8.8 mg/dL — ABNORMAL LOW (ref 8.9–10.3)
Chloride: 106 mmol/L (ref 98–111)
Creatinine, Ser: 2.47 mg/dL — ABNORMAL HIGH (ref 0.44–1.00)
GFR, Estimated: 21 mL/min — ABNORMAL LOW (ref >60)
Glucose, Bld: 127 mg/dL — ABNORMAL HIGH (ref 70–99)
Potassium: 3.1 mmol/L — ABNORMAL LOW (ref 3.5–5.1)
Sodium: 138 mmol/L (ref 135–145)
Total Bilirubin: 0.6 mg/dL (ref 0.3–1.2)
Total Protein: 7.2 g/dL (ref 6.5–8.1)

## 2021-10-21 LAB — CBC WITH DIFFERENTIAL/PLATELET
Abs Immature Granulocytes: 0.03 10*3/uL (ref 0.00–0.07)
Basophils Absolute: 0.1 10*3/uL (ref 0.0–0.1)
Basophils Relative: 1 %
Eosinophils Absolute: 0.2 10*3/uL (ref 0.0–0.5)
Eosinophils Relative: 2 %
HCT: 32.7 % — ABNORMAL LOW (ref 36.0–46.0)
Hemoglobin: 11 g/dL — ABNORMAL LOW (ref 12.0–15.0)
Immature Granulocytes: 0 %
Lymphocytes Relative: 18 %
Lymphs Abs: 1.6 10*3/uL (ref 0.7–4.0)
MCH: 28.4 pg (ref 26.0–34.0)
MCHC: 33.6 g/dL (ref 30.0–36.0)
MCV: 84.3 fL (ref 80.0–100.0)
Monocytes Absolute: 0.4 10*3/uL (ref 0.1–1.0)
Monocytes Relative: 5 %
Neutro Abs: 6.8 10*3/uL (ref 1.7–7.7)
Neutrophils Relative %: 74 %
Platelets: 161 10*3/uL (ref 150–400)
RBC: 3.88 MIL/uL (ref 3.87–5.11)
RDW: 13.4 % (ref 11.5–15.5)
WBC: 9.1 10*3/uL (ref 4.0–10.5)
nRBC: 0 % (ref 0.0–0.2)

## 2021-10-21 LAB — PROTIME-INR
INR: 1 (ref 0.8–1.2)
Prothrombin Time: 13.4 seconds (ref 11.4–15.2)

## 2021-10-21 LAB — ETHANOL: Alcohol, Ethyl (B): <10 mg/dL (ref <10)

## 2021-10-21 LAB — URINALYSIS, ROUTINE W REFLEX MICROSCOPIC
Bacteria, UA: NONE SEEN
Bilirubin Urine: NEGATIVE
Glucose, UA: NEGATIVE mg/dL
Hgb urine dipstick: NEGATIVE
Ketones, ur: NEGATIVE mg/dL
Nitrite: NEGATIVE
Protein, ur: 30 mg/dL — AB
Specific Gravity, Urine: 1.01 (ref 1.005–1.030)
pH: 5 (ref 5.0–8.0)

## 2021-10-21 LAB — BLOOD GAS, VENOUS
Acid-Base Excess: 0.1 mmol/L (ref 0.0–2.0)
Bicarbonate: 26 mmol/L (ref 20.0–28.0)
O2 Saturation: 70.9 %
Patient temperature: 37
pCO2, Ven: 46 mmHg (ref 44–60)
pH, Ven: 7.36 (ref 7.25–7.43)
pO2, Ven: 37 mmHg (ref 32–45)

## 2021-10-21 LAB — RESP PANEL BY RT-PCR (FLU A&B, COVID) ARPGX2
Influenza A by PCR: NEGATIVE
Influenza B by PCR: NEGATIVE
SARS Coronavirus 2 by RT PCR: NEGATIVE

## 2021-10-21 LAB — CBG MONITORING, ED
Glucose-Capillary: 170 mg/dL — ABNORMAL HIGH (ref 70–99)
Glucose-Capillary: 330 mg/dL — ABNORMAL HIGH (ref 70–99)

## 2021-10-21 LAB — TROPONIN I (HIGH SENSITIVITY)
Troponin I (High Sensitivity): 8 ng/L (ref <18)
Troponin I (High Sensitivity): 9 ng/L (ref <18)

## 2021-10-21 LAB — LACTIC ACID, PLASMA
Lactic Acid, Venous: 1.2 mmol/L (ref 0.5–1.9)
Lactic Acid, Venous: 1.3 mmol/L (ref 0.5–1.9)

## 2021-10-21 LAB — LIPASE, BLOOD: Lipase: 55 U/L — ABNORMAL HIGH (ref 11–51)

## 2021-10-21 MED ORDER — FAMOTIDINE IN NACL 20-0.9 MG/50ML-% IV SOLN
20.0000 mg | Freq: Once | INTRAVENOUS | Status: AC
  Administered 2021-10-21: 20 mg via INTRAVENOUS
  Filled 2021-10-21: qty 50

## 2021-10-21 MED ORDER — POTASSIUM CHLORIDE CRYS ER 20 MEQ PO TBCR
40.0000 meq | EXTENDED_RELEASE_TABLET | Freq: Once | ORAL | Status: AC
  Administered 2021-10-21: 40 meq via ORAL
  Filled 2021-10-21: qty 2

## 2021-10-21 MED ORDER — SODIUM CHLORIDE 0.9 % IV SOLN
Freq: Once | INTRAVENOUS | Status: AC
Start: 2021-10-21 — End: 2021-10-21

## 2021-10-21 NOTE — ED Provider Notes (Signed)
[facility] DEPT Provider Note   CSN: [id number] Arrival date & time: 10/21/21  1701     History  No chief complaint on file.   Bethany Gillespie is a 71 y.o. female.  HPI Patient reports she has very poor vision so she does not drive.  When she goes out she takes the bus.  She took the bus today to go shopping and when at the store she suddenly felt shaky and lightheaded.  She went outside the store and the staff called EMS.  On EMS arrival the patient had a blood sugar of 50 and gave her oral glucose.  Blood sugar came back up and she did feel better.  At that time she refused transport.  She was going to continue to walk on and do some shopping but within a very short period felt lightheaded again and had to sit down on a bench.  At that time she called EMS to transport her to the emergency department.  Recheck blood sugar had not dropped.  It was at 300.  He denies she had any headache.  She did not have chest pain.  She did not have any focal weakness numbness or tingling.  She just had a persistent feeling of feeling slightly tremulous and lightheaded.  Denies she has been sick recently.  She has not been having vomiting diarrhea fevers cough shortness of breath.  No recent pain burning urgency with urination.    Home Medications Prior to Admission medications   Medication Sig Start Date End Date Taking? Authorizing Provider  amLODipine (NORVASC) 5 MG tablet Take 0.5 tablets (2.5 mg total) by mouth daily. 08/16/21   Deboraha Sprang, MD  blood glucose meter kit and supplies by Other route as directed. Contour next Meter. Dispense based on patient and insurance preference. Use up to four times daily as directed. (FOR ICD-10 E10.9, E11.9).    [provider]  carvedilol (COREG) 3.125 MG tablet Take 1 tablet (3.125 mg total) by mouth 2 (two) times daily with a meal. 10/11/21   Furth, Cadence H, PA-C  Cholecalciferol 25 MCG (1000 UT) tablet Take 2,000 Units  by mouth daily.    [provider]  Continuous Blood Gluc Receiver (FREESTYLE LIBRE 2 READER) DEVI 1 each by Does not apply route daily. 05/16/21   Philemon Kingdom, MD  Continuous Blood Gluc Sensor (FREESTYLE LIBRE 2 SENSOR) MISC 1 each by Does not apply route every 14 (fourteen) days. 02/10/21   Philemon Kingdom, MD  ferrous sulfate 325 (65 FE) MG tablet Take 325 mg by mouth daily with breakfast.    [provider]  Glucagon 3 MG/DOSE POWD Place 3 mg into the nose once as needed for up to 1 dose. 11/03/20   Philemon Kingdom, MD  insulin glargine (LANTUS SOLOSTAR) 100 UNIT/ML Solostar Pen Inject 30 Units into the skin daily. 08/17/21   Philemon Kingdom, MD  insulin lispro (HUMALOG KWIKPEN) 100 UNIT/ML KwikPen Inject 4-8 Units into the skin 3 (three) times daily. Patient not taking: Reported on 10/11/2021 08/17/21   Philemon Kingdom, MD  Insulin Pen Needle 31G X 6 MM MISC Use 4 times daily 08/11/21   Philemon Kingdom, MD  rosuvastatin (CRESTOR) 20 MG tablet Take 1 tablet (20 mg total) by mouth daily. 11/02/20 10/28/21  Loel Dubonnet, NP  sacubitril-valsartan (ENTRESTO) 24-26 MG Take 1 tablet by mouth 2 (two) times daily. PLEASE KEEP SCHEDULED APPOINTMENT FOR FURTHER REFILLS 10/05/21   Wellington Hampshire, MD  Semaglutide,0.25  or 0.'5MG'$ /DOS, (OZEMPIC, 0.25 OR 0.5 MG/DOSE,) 2 MG/1.5ML SOPN Inject 0.5 mg into the skin once a week. 08/17/21   Philemon Kingdom, MD  torsemide (DEMADEX) 20 MG tablet TAKE 1 TABLET(20 MG) BY MOUTH DAILY 05/15/21   Deboraha Sprang, MD      Allergies    Bee venom and Atorvastatin    Review of Systems   Review of Systems 10 systems reviewed and negative except as per HPI Physical Exam Updated Vital Signs BP (!) 127/51    Pulse 75    Temp (!) 97.3 F (36.3 C)    Resp 12    SpO2 95%  Physical Exam Constitutional:      Appearance: Normal appearance.     Comments: Patient is nontoxic and alert.  No respiratory distress.  HENT:     Head: Normocephalic and  atraumatic.     Mouth/Throat:     Pharynx: Oropharynx is clear.  Eyes:     Extraocular Movements: Extraocular movements intact.     Conjunctiva/sclera: Conjunctivae normal.     Pupils: Pupils are equal, round, and reactive to light.  Cardiovascular:     Rate and Rhythm: Normal rate and regular rhythm.  Pulmonary:     Effort: Pulmonary effort is normal.     Breath sounds: Normal breath sounds.  Abdominal:     General: There is no distension.     Palpations: Abdomen is soft.     Tenderness: There is no abdominal tenderness. There is no guarding.  Musculoskeletal:        General: No swelling or tenderness. Normal range of motion.     Cervical back: Neck supple.     Right lower leg: No edema.     Left lower leg: No edema.  Skin:    General: Skin is warm and dry.  Neurological:     General: No focal deficit present.     Mental Status: She is alert and oriented to person, place, and time.     Cranial Nerves: No cranial nerve deficit.     Sensory: Sensory deficit present.     Motor: No weakness.     Coordination: Coordination normal.     Comments: Finger-nose exam normal bilaterally.  Speech clear.  Cognitive function normal intact.  Upper and lower motor strength 5\5.  Sensation intact to light touch bilaterally.  And upper and lower.  Psychiatric:        Mood and Affect: Mood normal.    ED Results / Procedures / Treatments   Labs (all labs ordered are listed, but only abnormal results are displayed) Labs Reviewed  COMPREHENSIVE METABOLIC PANEL - Abnormal; Notable for the following components:      Result Value   Potassium 3.1 (*)    Glucose, Bld 127 (*)    BUN 47 (*)    Creatinine, Ser 2.47 (*)    Calcium 8.8 (*)    AST 11 (*)    GFR, Estimated 21 (*)    All other components within normal limits  LIPASE, BLOOD - Abnormal; Notable for the following components:   Lipase 55 (*)    All other components within normal limits  CBC WITH DIFFERENTIAL/PLATELET - Abnormal; Notable  for the following components:   Hemoglobin 11.0 (*)    HCT 32.7 (*)    All other components within normal limits  URINALYSIS, ROUTINE W REFLEX MICROSCOPIC - Abnormal; Notable for the following components:   Protein, ur 30 (*)    Leukocytes,Ua TRACE (*)  All other components within normal limits  CBG MONITORING, ED - Abnormal; Notable for the following components:   Glucose-Capillary 170 (*)    All other components within normal limits  CBG MONITORING, ED - Abnormal; Notable for the following components:   Glucose-Capillary 330 (*)    All other components within normal limits  RESP PANEL BY RT-PCR (FLU A&B, COVID) ARPGX2  LACTIC ACID, PLASMA  LACTIC ACID, PLASMA  PROTIME-INR  BLOOD GAS, VENOUS  ETHANOL  TROPONIN I (HIGH SENSITIVITY)  TROPONIN I (HIGH SENSITIVITY)    EKG EKG Interpretation  Date/Time:  Saturday October 21 2021 20:39:03 EST Ventricular Rate:  85 PR Interval:  199 QRS Duration: 177 QT Interval:  475 QTC Calculation: 565 R Axis:   -66 Text Interpretation: paced, no change Confirmed by Charlesetta Shanks [id number]) on 10/21/2021 8:48:44 PM  Radiology CT Head Wo Contrast  Result Date: 10/21/2021 CLINICAL DATA:  Dizziness, persistent/recurrent, cardiac or vascular cause suspected EXAM: CT HEAD WITHOUT CONTRAST TECHNIQUE: Contiguous axial images were obtained from the base of the skull through the vertex without intravenous contrast. RADIATION DOSE REDUCTION: This exam was performed according to the departmental dose-optimization program which includes automated exposure control, adjustment of the mA and/or kV according to patient size and/or use of iterative reconstruction technique. COMPARISON:  None. BRAIN: BRAIN Patchy and confluent areas of decreased attenuation are noted throughout the deep and periventricular white matter of the cerebral hemispheres bilaterally, compatible with chronic microvascular ischemic disease. No evidence of large-territorial acute infarction. No  parenchymal hemorrhage. No mass lesion. No extra-axial collection. No mass effect or midline shift. No hydrocephalus. Basilar cisterns are patent. Vascular: No hyperdense vessel. Atherosclerotic calcifications are present within the cavernous internal carotid and vertebral arteries. Skull: No acute fracture or focal lesion. Sinuses/Orbits: Paranasal sinuses and mastoid air cells are clear. The orbits are unremarkable. Other: None. IMPRESSION: No acute intracranial abnormality. Electronically Signed   By: Iven Finn M.D.   On: 10/21/2021 19:34    Procedures Procedures   Monitor reviewed by myself multiple times.  Patient remains in a paced rhythm 80s. Medications Ordered in ED Medications  potassium chloride SA (KLOR-CON M) CR tablet 40 mEq (40 mEq Oral Given 10/21/21 1948)  0.9 %  sodium chloride infusion (0 mLs Intravenous Stopped 10/21/21 2202)  famotidine (PEPCID) IVPB 20 mg premix (0 mg Intravenous Stopped 10/21/21 2102)    ED Course/ Medical Decision Making/ A&P                           Medical Decision Making Amount and/or Complexity of Data Reviewed Labs: ordered. Radiology: ordered.  Risk Prescription drug management.   Patient episode of lightheadedness and feeling shaky while shopping.  She reports she felt well when she went out.  EMS identified a blood sugar of 50.  Patient had no chest pain or illogic symptoms.  Symptoms rebounded with generalized lightheadedness.  Broad diagnostic evaluation initiated.  Neurologic exam was normal without indication of acute stroke.  EKG and troponin do not show evidence of acute ischemic event.  She did not have associated symptoms of sweating, shortness of breath patient ACS seems less likely with normal troponins.  Patient's pacer appears to be functioning normally with continued paced rhythm.  No signs of infectious etiology.  Patient's review of systems is otherwise negative.  He does not have fever.  No leukocytosis.  Vital signs  have remained stable.  Patient's daughter has been at bedside.  Patient  does live at home with her daughter.  At this time after completing evaluation patient is stable for discharge and close follow-up.  Patient's daughter will be present to assist and monitor for any need to return.  Careful return precautions are reviewed.  A follow-up plan with close follow-up with PCP and cardiology is recommended        Final Clinical Impression(s) / ED Diagnoses Final diagnoses:  Near syncope  Lightheadedness    Rx / DC Orders ED Discharge Orders     None         Charlesetta Shanks, MD 10/21/21 2211

## 2021-10-21 NOTE — ED Triage Notes (Signed)
Patient presented to the ED with c/o weakness. Patient call ems early for fall due to hypoglycemia CBG 50 and was given oral intake and she refused to came to the hospital. patient call ems  for the second time for generalized weakness. Current CBG 300. ?

## 2021-10-21 NOTE — Discharge Instructions (Signed)
1.  Monitor your blood sugar at home.  Monitor your blood pressure.  Take your regular medications.  Try to rest and stay hydrated.  Be careful with any abrupt position changes.  Move slowly and make sure he has something to hold onto. ?2.  Return to emergency department immediately if you have any new concerning or worsening symptoms. ?3.  Follow-up with your doctor and your cardiologist within the next 3 to 5 days. ?

## 2021-10-21 NOTE — ED Notes (Signed)
Ambulated patient. Oxygen remained above 96%, heart rate 88. Patient said she felt a little light headed. ?

## 2021-10-24 ENCOUNTER — Telehealth: Payer: Self-pay

## 2021-10-24 NOTE — Telephone Encounter (Signed)
Inbound fax requesting forms be completed and faxed with recent clinical notes. Forms completed and faxed to [employer] at [phone] ? ?

## 2021-11-10 ENCOUNTER — Ambulatory Visit (INDEPENDENT_AMBULATORY_CARE_PROVIDER_SITE_OTHER): Payer: Medicare Other

## 2021-11-10 DIAGNOSIS — I441 Atrioventricular block, second degree: Secondary | ICD-10-CM

## 2021-11-10 LAB — CUP PACEART REMOTE DEVICE CHECK
Battery Remaining Longevity: 84 mo
Battery Remaining Percentage: 81 %
Brady Statistic RA Percent Paced: 2 %
Brady Statistic RV Percent Paced: 100 %
Date Time Interrogation Session: 20230330161800
Implantable Lead Implant Date: 20160810
Implantable Lead Implant Date: 20160810
Implantable Lead Location: 753859
Implantable Lead Location: 753860
Implantable Lead Model: 7740
Implantable Lead Model: 7741
Implantable Lead Serial Number: 640659
Implantable Lead Serial Number: 682646
Implantable Pulse Generator Implant Date: 20160810
Lead Channel Impedance Value: 602 Ohm
Lead Channel Impedance Value: 818 Ohm
Lead Channel Setting Pacing Amplitude: 2 V
Lead Channel Setting Pacing Amplitude: 2.4 V
Lead Channel Setting Pacing Pulse Width: 0.5 ms
Lead Channel Setting Sensing Sensitivity: 2.5 mV
Pulse Gen Serial Number: 722311

## 2021-11-16 ENCOUNTER — Other Ambulatory Visit (HOSPITAL_COMMUNITY): Payer: Self-pay | Admitting: Nephrology

## 2021-11-16 DIAGNOSIS — E1122 Type 2 diabetes mellitus with diabetic chronic kidney disease: Secondary | ICD-10-CM

## 2021-11-16 DIAGNOSIS — N281 Cyst of kidney, acquired: Secondary | ICD-10-CM

## 2021-11-22 NOTE — Progress Notes (Signed)
Remote pacemaker transmission.   

## 2021-12-15 ENCOUNTER — Ambulatory Visit (INDEPENDENT_AMBULATORY_CARE_PROVIDER_SITE_OTHER): Payer: Medicare Other | Admitting: Internal Medicine

## 2021-12-15 ENCOUNTER — Encounter: Payer: Self-pay | Admitting: Internal Medicine

## 2021-12-15 VITALS — BP 120/79 | HR 77 | Ht 62.0 in | Wt 153.0 lb

## 2021-12-15 DIAGNOSIS — E785 Hyperlipidemia, unspecified: Secondary | ICD-10-CM | POA: Diagnosis not present

## 2021-12-15 DIAGNOSIS — E1159 Type 2 diabetes mellitus with other circulatory complications: Secondary | ICD-10-CM

## 2021-12-15 DIAGNOSIS — E1165 Type 2 diabetes mellitus with hyperglycemia: Secondary | ICD-10-CM | POA: Diagnosis not present

## 2021-12-15 LAB — POCT GLYCOSYLATED HEMOGLOBIN (HGB A1C): Hemoglobin A1C: 7.1 % — AB (ref 4.0–5.6)

## 2021-12-15 MED ORDER — LANTUS SOLOSTAR 100 UNIT/ML ~~LOC~~ SOPN: 20.0000 [IU] | PEN_INJECTOR | Freq: Every day | SUBCUTANEOUS | 3 refills | Status: DC

## 2021-12-15 NOTE — Progress Notes (Signed)
Patient ID: Rim Thatch, female   DOB: 08-08-1951, 71 y.o.   MRN: [id number]  ? ?This visit occurred during the SARS-CoV-2 public health emergency.  Safety protocols were in place, including screening questions prior to the visit, additional usage of staff PPE, and extensive cleaning of exam room while observing appropriate contact time as indicated for disinfecting solutions.  ? ?HPI: ?Shamyah Stantz is a 71 y.o.-year-old female, initially referred by her previous endocrinologist, Dr. Gabriel Carina, returning for follow-up for DM2, dx in 2000s, insulin-dependent  - started several years later, uncontrolled, with complications (CAD, nonischemic CHF, diabetic retinopathy, peripheral neuropathy, CKD stage IV with microalbuminuria).  She was previously seeing Dr. Gabriel Carina but cannot drive to her office anymore due to her worsening vision she had to establish care with an endocrinologist in town.  Our last visit was 3 months ago ? ?Interim hx: ?No increased urination, nausea, chest pain. She has blurry vision (macular degeneration). She cannot drive. ?At the end of 09/2021, she had a low blood sugar (LO) while in the car, after a meal.  She decreased her Lantus dose slightly afterwards. ?She had another hypoglycemia episode with a low blood sugar (LO) on 10/21/2021. This happened while shopping.  She was taken to the hospital and given Gatorade.  She had other low BGs - 50s and 60s.  ?She tells me she now afraid to take Humalog before she eats, but takes it after she eats. ? ?Reviewed HbA1c levels: ?Lab Results  ?Component Value Date  ? HGBA1C 6.8 (A) 08/17/2021  ? HGBA1C 9.7 (A) 05/16/2021  ? HGBA1C 9.4 (A) 02/10/2021  ? HGBA1C 8.9 (A) 11/03/2020  ? HGBA1C 12.9 (H) 02/28/2016  ? HGBA1C 12.0 (H) 02/27/2016  ? HGBA1C 9.7 (H) 02/25/2014  ?12/2019: HbA1c 8.9% ? ?Pt is on a regimen of: ?- Ozempic 0.25 >> 0.5 mg weekly - added 02/2021 - tolerates it well  ?- Lantus 40 >> 35 >> 25 units at bedtime (decreased end of 09/2021) ?-  Humalog 12 units 3x a day, right before meals >> Taking 8-12 >> 4-8 units before meal >> 4-8 units during meals, only of the sugars before the meals are >120 (takes 8 units for BG >150) ?Prev. On Metformin and Actos. ? ?Pt checks her sugars more than 4 times a day with her freestyle libre CGM: ? ? ?Previously: ? ? ?Prev.: ?- am: 78-150 >> 150-199 ?- 2h after b'fast: ? ?- lunch: 175-200 >> 100-125 ?- 2h after lunch: LO (if active) ?- dinner: 175-200 >> 100-199 ?- 2h after dinner: <200s >> 80-150 ?- bedtime: n/c - snack. ? ?Previously: ? ? ?Lowest sugar was LO >> 70s >> LO (after lunch) >> 45 (CGM) >> LO; she has hypoglycemia awareness at 64s.  ?Highest sugar was 338 (overcompensation) >> 200s >> 225 (after a low) >> 300s >> 300 (gatorade). ? ?Pt's meals are: ?- Breakfast: oatmeal + berries, yoghurt ?- Lunch: salad or cheese + crackers, soup ?- Dinner: meat + veggies or starch ?- Snacks: 3: nuts, popcorn ? ?- + CKD with microalbuminuria - Dr. Candiss Norse, last BUN/creatinine:  ?Lab Results  ?Component Value Date  ? BUN 47 (H) 10/21/2021  ? BUN 26 (H) 12/27/2016  ? CREATININE 2.47 (H) 10/21/2021  ? CREATININE 1.84 (H) 10/20/2018  ?05/16/2019: 39/217, GFR 23, glucose 161, protein to creatinine ratio 0.156 (0.024-0.184) ?09/14/2020: 20/1.9, GFR 26, glucose 174 ?05/24/2020: 25/2.1 glucose 118 ?She is on Entresto twice a day. ? ?- + HL; last set of lipids: ?08/2021: In PCPs office-pending  results ?09/14/2020: 166/145/61.4/76 ?Lab Results  ?Component Value Date  ? CHOL 164 07/13/2020  ? HDL 61 07/13/2020  ? [place] 76 07/13/2020  ? TRIG 133 07/13/2020  ? CHOLHDL 2.7 07/13/2020  ?She is on Crestor 10 mg daily.  Managed by cardiology (Dr. Fletcher Anon). ? ?- last eye exam 10/2020.  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 + IO injections. ? ?- + numbness and tingling in her feet. ? ?Pt has FH of DM in brother and grandson (DM1). ? ?Latest TSH was normal, at 1.074 on 08/29/2018. ?No results found  for: TSH ?She is on iron sulfate for anemia. ?She has a history of adrenal adenoma in 04/2018 >> c/w benign adenoma. ?She saw nephrology for a renal cyst. ? ?No h/o pancreatitis. No FH of MTC. ? ?ROS: + See HPI ?Constitutional:  + nocturia, + poor sleep ?Eyes: + Blurry vision (macular degeneration), no xerophthalmia ? ?Past Medical History:  ?Diagnosis Date  ? 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 a. s/p Engineer, agricultural PPM in 03/2015  ? Bell's palsy   ? Bradycardia   ? CAD (coronary artery disease)   ? a. 02/2016 Cath: LM nl, LAD 30p, D1 small w/ severe ostial/mid dzs, LCX nl, RCA nl, EF <25%, glob HK.  ? Chronic combined systolic and diastolic CHF (congestive heart failure) ([place])   ? a. Echo in 03/2015: EF 55-60%, Grade 1 DD;  b. 02/2016 Echo: 25-30%, mod LVH, Gr1 DD, mild MR, mildly dil LA, prominent apical trabeculations.  ? Diabetes ([place])   ? Hernia of abdominal wall   ? HTN (hypertension)   ? Hypertension   ? NICM (nonischemic cardiomyopathy) ([place])   ? a. 02/2016 relatively non-obstructive cath, EF 25-30% by echo.  ? ?Past Surgical History:  ?Procedure Laterality Date  ? CARDIAC CATHETERIZATION Bilateral 03/01/2016  ? Procedure: Left Heart Cath and Coronary Angiography;  Surgeon: Minna Merritts, MD;  Location: [facility];  Service: Cardiovascular;  Laterality: Bilateral;  ? COLONOSCOPY WITH PROPOFOL N/A 05/06/2019  ? Procedure: COLONOSCOPY WITH PROPOFOL;  Surgeon: Toledo, Benay Pike, MD;  Location: [facility];  Service: Gastroenterology;  Laterality: N/A;  ? ESOPHAGOGASTRODUODENOSCOPY (EGD) WITH PROPOFOL N/A 05/06/2019  ? Procedure: ESOPHAGOGASTRODUODENOSCOPY (EGD) WITH PROPOFOL;  Surgeon: Toledo, Benay Pike, MD;  Location: [facility];  Service: Gastroenterology;  Laterality: N/A;  ? HERNIA REPAIR    ? INSERT / REPLACE / REMOVE PACEMAKER    ? PACEMAKER INSERTION    ? PACEMAKER INSERTION    ? ?Social History  ? ?Socioeconomic History  ? Marital status: Widowed  ?  Spouse name: Not on file  ? Number  of children: 5  ? Years of education: Not on file  ? Highest education level: Not on file  ?Occupational History  ? Not on file  ?Tobacco Use  ? Smoking status: Never  ? Smokeless tobacco: Never  ?Vaping Use  ? Vaping Use: Never used  ?Substance and Sexual Activity  ? Alcohol use: Not Currently  ? Drug use: No  ? Sexual activity: Not on file  ?Other Topics Concern  ? Not on file  ?Social History Narrative  ? Not on file  ? ?Social Determinants of Health  ? ?Financial Resource Strain: Not on file  ?Food Insecurity: Not on file  ?Transportation Needs: Not on file  ?Physical Activity: Not on file  ?Stress: Not on file  ?Social Connections: Not on file  ?Intimate Partner Violence: Not on file  ? ?  Current Outpatient Medications on File Prior to Visit  ?Medication Sig Dispense Refill  ? blood glucose meter kit and supplies by Other route as directed. Contour next Meter. Dispense based on patient and insurance preference. Use up to four times daily as directed. (FOR ICD-10 E10.9, E11.9).    ? carvedilol (COREG) 3.125 MG tablet Take 1 tablet (3.125 mg total) by mouth 2 (two) times daily with a meal. 180 tablet 3  ? Cholecalciferol 25 MCG (1000 UT) tablet Take 2,000 Units by mouth daily.    ? Continuous Blood Gluc Receiver (FREESTYLE LIBRE 2 READER) DEVI 1 each by Does not apply route daily. 1 each 0  ? Continuous Blood Gluc Sensor (FREESTYLE LIBRE 2 SENSOR) MISC 1 each by Does not apply route every 14 (fourteen) days. 6 each 3  ? ferrous sulfate 325 (65 FE) MG tablet Take 325 mg by mouth daily with breakfast.    ? Glucagon 3 MG/DOSE POWD Place 3 mg into the nose once as needed for up to 1 dose. 1 each 11  ? insulin glargine (LANTUS SOLOSTAR) 100 UNIT/ML Solostar Pen Inject 30 Units into the skin daily. 30 mL 3  ? insulin lispro (HUMALOG KWIKPEN) 100 UNIT/ML KwikPen Inject 4-8 Units into the skin 3 (three) times daily. (Patient not taking: Reported on 10/11/2021) 15 mL 3  ? Insulin Pen Needle 31G X 6 MM MISC Use 4 times daily  150 each 3  ? rosuvastatin (CRESTOR) 20 MG tablet Take 1 tablet (20 mg total) by mouth daily. 90 tablet 3  ? sacubitril-valsartan (ENTRESTO) 24-26 MG Take 1 tablet by mouth 2 (two) times daily. PLEASE KEEP [place]

## 2021-12-15 NOTE — Patient Instructions (Addendum)
Please continue: ?- Ozempic 0.5 mg weekly ? ?Please decrease: ?- Lantus 20 units at bedtime ?- Humalog before a meal: 2-4 units  ? ? If you plan to be active after a meal, you may not need Humalog before the previous meal.. ? ?Please return in 3-4 months.  ?

## 2022-02-09 ENCOUNTER — Ambulatory Visit (INDEPENDENT_AMBULATORY_CARE_PROVIDER_SITE_OTHER): Payer: Medicare Other

## 2022-02-09 DIAGNOSIS — I441 Atrioventricular block, second degree: Secondary | ICD-10-CM | POA: Diagnosis not present

## 2022-02-09 LAB — CUP PACEART REMOTE DEVICE CHECK
Battery Remaining Longevity: 72 mo
Battery Remaining Percentage: 73 %
Brady Statistic RA Percent Paced: 2 %
Brady Statistic RV Percent Paced: 100 %
Date Time Interrogation Session: 20230630023300
Implantable Lead Implant Date: 20160810
Implantable Lead Implant Date: 20160810
Implantable Lead Location: 753859
Implantable Lead Location: 753860
Implantable Lead Model: 7740
Implantable Lead Model: 7741
Implantable Lead Serial Number: 640659
Implantable Lead Serial Number: 682646
Implantable Pulse Generator Implant Date: 20160810
Lead Channel Impedance Value: 588 Ohm
Lead Channel Impedance Value: 798 Ohm
Lead Channel Setting Pacing Amplitude: 2 V
Lead Channel Setting Pacing Amplitude: 2.4 V
Lead Channel Setting Pacing Pulse Width: 0.5 ms
Lead Channel Setting Sensing Sensitivity: 2.5 mV
Pulse Gen Serial Number: 722311

## 2022-02-22 NOTE — Progress Notes (Signed)
Remote pacemaker transmission.   

## 2022-03-06 ENCOUNTER — Inpatient Hospital Stay: Admission: RE | Admit: 2022-03-06 | Payer: Medicare Other | Source: Ambulatory Visit

## 2022-03-12 ENCOUNTER — Emergency Department (HOSPITAL_COMMUNITY): Payer: Medicare Other

## 2022-03-12 ENCOUNTER — Emergency Department (HOSPITAL_COMMUNITY)
Admission: EM | Admit: 2022-03-12 | Discharge: 2022-03-13 | Disposition: A | Discharge status: Home / Self Care | Payer: Medicare Other | Attending: Emergency Medicine | Admitting: Emergency Medicine

## 2022-03-12 ENCOUNTER — Encounter (HOSPITAL_COMMUNITY): Payer: Self-pay | Admitting: Emergency Medicine

## 2022-03-12 ENCOUNTER — Other Ambulatory Visit: Payer: Self-pay

## 2022-03-12 DIAGNOSIS — S8991XA Unspecified injury of right lower leg, initial encounter: Secondary | ICD-10-CM | POA: Diagnosis present

## 2022-03-12 DIAGNOSIS — M25561 Pain in right knee: Secondary | ICD-10-CM | POA: Diagnosis not present

## 2022-03-12 DIAGNOSIS — S0990XA Unspecified injury of head, initial encounter: Secondary | ICD-10-CM | POA: Diagnosis not present

## 2022-03-12 DIAGNOSIS — S82001A Unspecified fracture of right patella, initial encounter for closed fracture: Secondary | ICD-10-CM | POA: Insufficient documentation

## 2022-03-12 DIAGNOSIS — W19XXXA Unspecified fall, initial encounter: Secondary | ICD-10-CM | POA: Insufficient documentation

## 2022-03-12 DIAGNOSIS — Z794 Long term (current) use of insulin: Secondary | ICD-10-CM | POA: Insufficient documentation

## 2022-03-12 NOTE — ED Provider Triage Note (Signed)
Emergency Medicine Provider Triage Evaluation Note  Bethany Gillespie , a 71 y.o. female  was evaluated in triage.  Pt complains of fall yesterday.  She reports that she had a mechanical non syncopal fall.  Nose and right knee     Physical Exam  BP (!) 172/106 (BP Location: Right Arm)   Pulse 81   Temp 98.7 F (37.1 C)   Resp 18   Ht '5\' 2"'$  (1.575 m)   Wt 64.4 kg   SpO2 99%   BMI 25.97 kg/m  Gen:   Awake, no distress   Resp:  Normal effort  MSK:   Moves extremities without difficulty  Other:  Contusion over the nose. Right knee TTP   Medical Decision Making  Medically screening exam initiated at 8:14 PM.  Appropriate orders placed.  Bethany Gillespie was informed that the remainder of the evaluation will be completed by another provider, this initial triage assessment does not replace that evaluation, and the importance of remaining in the ED until their evaluation is complete.     Lorin Glass, [place] 03/12/22 2015

## 2022-03-12 NOTE — ED Triage Notes (Signed)
Patient reports falling yesterday and "face planting" on the ground.  Patient denies facial pain, endorses right knee pain.  Patient denies being on  blood thinners.  Patient denies LOC.

## 2022-03-13 ENCOUNTER — Encounter (HOSPITAL_COMMUNITY): Payer: Self-pay | Admitting: Emergency Medicine

## 2022-03-13 DIAGNOSIS — S82001A Unspecified fracture of right patella, initial encounter for closed fracture: Secondary | ICD-10-CM | POA: Diagnosis not present

## 2022-03-13 MED ORDER — KETOROLAC TROMETHAMINE 60 MG/2ML IM SOLN
30.0000 mg | Freq: Once | INTRAMUSCULAR | Status: AC
  Administered 2022-03-13: 30 mg via INTRAMUSCULAR
  Filled 2022-03-13: qty 2

## 2022-03-13 MED ORDER — DICLOFENAC SODIUM ER 100 MG PO TB24: 100.0000 mg | ORAL_TABLET | Freq: Every day | ORAL | 0 refills | Status: DC

## 2022-03-13 NOTE — ED Provider Notes (Signed)
[facility] DEPT Provider Note   CSN: [id number] Arrival date & time: 03/12/22  1849     History  Chief Complaint  Patient presents with   Bethany Gillespie is a 71 y.o. female.  The history is provided by the patient.  Fall This is a new problem. The current episode started yesterday. The problem occurs rarely. The problem has been resolved. Pertinent negatives include no chest pain, no abdominal pain and no shortness of breath. Nothing aggravates the symptoms. Nothing relieves the symptoms. She has tried nothing for the symptoms. The treatment provided no relief.  [place] more than 24 hours ago at vacation.  Tetanus UTD     Home Medications Prior to Admission medications   Medication Sig Start Date End Date Taking? Authorizing Provider  blood glucose meter kit and supplies by Other route as directed. Contour next Meter. Dispense based on patient and insurance preference. Use up to four times daily as directed. (FOR ICD-10 E10.9, E11.9).    [provider]  carvedilol (COREG) 3.125 MG tablet Take 1 tablet (3.125 mg total) by mouth 2 (two) times daily with a meal. 10/11/21   Furth, Cadence H, PA-C  Cholecalciferol 25 MCG (1000 UT) tablet Take 2,000 Units by mouth daily.    [provider]  Continuous Blood Gluc Receiver (FREESTYLE LIBRE 2 READER) DEVI 1 each by Does not apply route daily. 05/16/21   Philemon Kingdom, MD  Continuous Blood Gluc Sensor (FREESTYLE LIBRE 2 SENSOR) MISC 1 each by Does not apply route every 14 (fourteen) days. 02/10/21   Philemon Kingdom, MD  ferrous sulfate 325 (65 FE) MG tablet Take 325 mg by mouth daily with breakfast.    [provider]  Glucagon 3 MG/DOSE POWD Place 3 mg into the nose once as needed for up to 1 dose. 11/03/20   Philemon Kingdom, MD  insulin glargine (LANTUS SOLOSTAR) 100 UNIT/ML Solostar Pen Inject 20 Units into the skin daily. 12/15/21   Philemon Kingdom, MD  insulin lispro  (HUMALOG KWIKPEN) 100 UNIT/ML KwikPen Inject 4-8 Units into the skin 3 (three) times daily. Patient taking differently: Inject 2-6 Units into the skin 3 (three) times daily. Pt taking PRN with meals 08/17/21   Philemon Kingdom, MD  Insulin Pen Needle 31G X 6 MM MISC Use 4 times daily 08/11/21   Philemon Kingdom, MD  potassium chloride (KLOR-CON) 10 MEQ tablet Take 10 mEq by mouth daily. 02/27/22   [provider]  rosuvastatin (CRESTOR) 20 MG tablet Take 1 tablet (20 mg total) by mouth daily. 11/02/20 10/28/21  Loel Dubonnet, NP  sacubitril-valsartan (ENTRESTO) 24-26 MG Take 1 tablet by mouth 2 (two) times daily. PLEASE KEEP SCHEDULED APPOINTMENT FOR FURTHER REFILLS 10/05/21   Wellington Hampshire, MD  Semaglutide,0.25 or 0.5MG /DOS, (OZEMPIC, 0.25 OR 0.5 MG/DOSE,) 2 MG/1.5ML SOPN Inject 0.5 mg into the skin once a week. 08/17/21   Philemon Kingdom, MD  torsemide (DEMADEX) 20 MG tablet TAKE 1 TABLET(20 MG) BY MOUTH DAILY Patient taking differently: Take 20 mg by mouth daily. 05/15/21   Deboraha Sprang, MD      Allergies    Bee venom and Atorvastatin    Review of Systems   Review of Systems  Constitutional:  Negative for fever.  HENT:  Negative for facial swelling.   Eyes:  Negative for redness.  Respiratory:  Negative for shortness of breath.   Cardiovascular:  Negative for chest pain.  Gastrointestinal:  Negative for abdominal pain.  Musculoskeletal:  Positive for arthralgias.  All other systems reviewed and are negative.   Physical Exam Updated Vital Signs BP (!) 162/77   Pulse 77   Temp 98.1 F (36.7 C) (Oral)   Resp 16   Ht $R'5\' 2"'nK$  (1.575 m)   Wt 64.4 kg   SpO2 95%   BMI 25.97 kg/m  Physical Exam Vitals and nursing note reviewed.  Constitutional:      General: She is not in acute distress.    Appearance: Normal appearance.  HENT:     Head: Normocephalic.      Nose: Nose normal.  Eyes:     Extraocular Movements: Extraocular movements intact.     Pupils: Pupils are  equal, round, and reactive to light.  Cardiovascular:     Rate and Rhythm: Normal rate and regular rhythm.     Pulses: Normal pulses.     Heart sounds: Normal heart sounds.  Pulmonary:     Effort: Pulmonary effort is normal.     Breath sounds: Normal breath sounds.  Abdominal:     General: Abdomen is flat. Bowel sounds are normal.     Tenderness: There is no abdominal tenderness. There is no guarding.  Musculoskeletal:     Cervical back: Normal.     Right hip: Normal.     Left hip: Normal.     Right knee: Swelling and bony tenderness present. No effusion, ecchymosis, lacerations or crepitus. Tenderness present. No MCL laxity, ACL laxity or PCL laxity. Normal pulse.     Left knee: Normal. No LCL laxity, MCL laxity, ACL laxity or PCL laxity.Normal pulse.     Right ankle: Normal.     Right Achilles Tendon: Normal.     Left ankle: Normal.     Left Achilles Tendon: Normal.     Right foot: Normal.     Left foot: Normal.  Skin:    General: Skin is warm and dry.     Capillary Refill: Capillary refill takes less than 2 seconds.  Neurological:     General: No focal deficit present.     Mental Status: She is alert.     Deep Tendon Reflexes: Reflexes normal.  Psychiatric:        Mood and Affect: Mood normal.        Behavior: Behavior normal.     ED Results / Procedures / Treatments   Labs (all labs ordered are listed, but only abnormal results are displayed) Labs Reviewed - No data to display  EKG None  Radiology DG Knee Complete 4 Views Right  Result Date: 03/12/2022 CLINICAL DATA:  Recent fall yesterday with right knee pain, initial encounter EXAM: RIGHT KNEE - COMPLETE 4+ VIEW COMPARISON:  None Available. FINDINGS: There is an undisplaced fracture through the superior aspect of the patella with only minimal joint effusion identified. No other fracture is seen. No soft tissue abnormality is noted. IMPRESSION: Undisplaced superior patellar fracture Electronically Signed   By: Inez Catalina M.D.   On: 03/12/2022 20:49   CT Head Wo Contrast  Result Date: 03/12/2022 CLINICAL DATA:  Trauma. EXAM: CT HEAD WITHOUT CONTRAST CT MAXILLOFACIAL WITHOUT CONTRAST CT CERVICAL SPINE WITHOUT CONTRAST TECHNIQUE: Multidetector CT imaging of the head, cervical spine, and maxillofacial structures were performed using the standard protocol without intravenous contrast. Multiplanar CT image reconstructions of the cervical spine and maxillofacial structures were also generated. RADIATION DOSE REDUCTION: This exam was performed according to the departmental dose-optimization program which includes automated exposure control, adjustment of  the mA and/or kV according to patient size and/or use of iterative reconstruction technique. COMPARISON:  Head CT dated 10/21/2021. FINDINGS: CT HEAD FINDINGS Brain: Mild age-related atrophy and chronic microvascular ischemic changes. There is no acute intracranial hemorrhage. No mass effect or midline shift. No extra-axial fluid collection. Vascular: No hyperdense vessel or unexpected calcification. Skull: Normal. Negative for fracture or focal lesion. Other: None CT MAXILLOFACIAL FINDINGS Osseous: No fracture or mandibular dislocation. No destructive process. Orbits: The globes and retro-orbital fat are preserved. Sinuses: Clear. Soft tissues: Negative. CT CERVICAL SPINE FINDINGS Alignment: No acute subluxation. Skull base and vertebrae: No acute fracture.  Osteopenia. Soft tissues and spinal canal: No prevertebral fluid or swelling. No visible canal hematoma. Disc levels:  Degenerative changes. Upper chest: Negative. Other: Bilateral carotid bulb calcified plaques. IMPRESSION: 1. No acute intracranial pathology. 2. No acute facial bone fractures. 3. No acute/traumatic cervical spine pathology. Electronically Signed   By: Anner Crete M.D.   On: 03/12/2022 20:49   CT Cervical Spine Wo Contrast  Result Date: 03/12/2022 CLINICAL DATA:  Trauma. EXAM: CT HEAD WITHOUT  CONTRAST CT MAXILLOFACIAL WITHOUT CONTRAST CT CERVICAL SPINE WITHOUT CONTRAST TECHNIQUE: Multidetector CT imaging of the head, cervical spine, and maxillofacial structures were performed using the standard protocol without intravenous contrast. Multiplanar CT image reconstructions of the cervical spine and maxillofacial structures were also generated. RADIATION DOSE REDUCTION: This exam was performed according to the departmental dose-optimization program which includes automated exposure control, adjustment of the mA and/or kV according to patient size and/or use of iterative reconstruction technique. COMPARISON:  Head CT dated 10/21/2021. FINDINGS: CT HEAD FINDINGS Brain: Mild age-related atrophy and chronic microvascular ischemic changes. There is no acute intracranial hemorrhage. No mass effect or midline shift. No extra-axial fluid collection. Vascular: No hyperdense vessel or unexpected calcification. Skull: Normal. Negative for fracture or focal lesion. Other: None CT MAXILLOFACIAL FINDINGS Osseous: No fracture or mandibular dislocation. No destructive process. Orbits: The globes and retro-orbital fat are preserved. Sinuses: Clear. Soft tissues: Negative. CT CERVICAL SPINE FINDINGS Alignment: No acute subluxation. Skull base and vertebrae: No acute fracture.  Osteopenia. Soft tissues and spinal canal: No prevertebral fluid or swelling. No visible canal hematoma. Disc levels:  Degenerative changes. Upper chest: Negative. Other: Bilateral carotid bulb calcified plaques. IMPRESSION: 1. No acute intracranial pathology. 2. No acute facial bone fractures. 3. No acute/traumatic cervical spine pathology. Electronically Signed   By: Anner Crete M.D.   On: 03/12/2022 20:49   CT Maxillofacial WO CM  Result Date: 03/12/2022 CLINICAL DATA:  Trauma. EXAM: CT HEAD WITHOUT CONTRAST CT MAXILLOFACIAL WITHOUT CONTRAST CT CERVICAL SPINE WITHOUT CONTRAST TECHNIQUE: Multidetector CT imaging of the head, cervical spine,  and maxillofacial structures were performed using the standard protocol without intravenous contrast. Multiplanar CT image reconstructions of the cervical spine and maxillofacial structures were also generated. RADIATION DOSE REDUCTION: This exam was performed according to the departmental dose-optimization program which includes automated exposure control, adjustment of the mA and/or kV according to patient size and/or use of iterative reconstruction technique. COMPARISON:  Head CT dated 10/21/2021. FINDINGS: CT HEAD FINDINGS Brain: Mild age-related atrophy and chronic microvascular ischemic changes. There is no acute intracranial hemorrhage. No mass effect or midline shift. No extra-axial fluid collection. Vascular: No hyperdense vessel or unexpected calcification. Skull: Normal. Negative for fracture or focal lesion. Other: None CT MAXILLOFACIAL FINDINGS Osseous: No fracture or mandibular dislocation. No destructive process. Orbits: The globes and retro-orbital fat are preserved. Sinuses: Clear. Soft tissues: Negative. CT CERVICAL SPINE FINDINGS  Alignment: No acute subluxation. Skull base and vertebrae: No acute fracture.  Osteopenia. Soft tissues and spinal canal: No prevertebral fluid or swelling. No visible canal hematoma. Disc levels:  Degenerative changes. Upper chest: Negative. Other: Bilateral carotid bulb calcified plaques. IMPRESSION: 1. No acute intracranial pathology. 2. No acute facial bone fractures. 3. No acute/traumatic cervical spine pathology. Electronically Signed   By: Anner Crete M.D.   On: 03/12/2022 20:49    Procedures Procedures    Medications Ordered in ED Medications  ketorolac (TORADOL) injection 30 mg (30 mg Intramuscular Given 03/13/22 0053)    ED Course/ Medical Decision Making/ A&P                           Medical Decision Making Patient fell mechanically   Amount and/or Complexity of Data Reviewed External Data Reviewed: notes.    Details: previous notes  reviewed Radiology: ordered.    Details: negative head and C spine CT by me Discussion of management or test interpretation with external provider(s): Case d/w Dr. Mardelle Matte place in immobilizer and follow up   Risk Prescription drug management. Risk Details: Wear immobilizer at all times.  Use crutches call to be seen by orthopedics in am.  Strict return.      Final Clinical Impression(s) / ED Diagnoses Final diagnoses:  None   Return for intractable cough, coughing up blood, fevers > 100.4 unrelieved by medication, shortness of breath, intractable vomiting, chest pain, shortness of breath, weakness, numbness, changes in speech, facial asymmetry, abdominal pain, passing out, Inability to tolerate liquids or food, cough, altered mental status or any concerns. No signs of systemic illness or infection. The patient is nontoxic-appearing on exam and vital signs are within normal limits.  I have reviewed the triage vital signs and the nursing notes. Pertinent labs & imaging results that were available during my care of the patient were reviewed by me and considered in my medical decision making (see chart for details). After history, exam, and medical workup I feel the patient has been appropriately medically screened and is safe for discharge home. Pertinent diagnoses were discussed with the patient. Patient was given return precautions.  Rx / DC Orders ED Discharge Orders     None         Marty Uy, MD 03/13/22 0145

## 2022-04-17 ENCOUNTER — Encounter: Payer: Self-pay | Admitting: Cardiovascular Disease

## 2022-04-17 ENCOUNTER — Ambulatory Visit: Payer: Medicare Other | Attending: Cardiovascular Disease | Admitting: Cardiovascular Disease

## 2022-04-17 VITALS — BP 100/60 | HR 84 | Ht 62.0 in | Wt 136.1 lb

## 2022-04-17 DIAGNOSIS — I5022 Chronic systolic (congestive) heart failure: Secondary | ICD-10-CM

## 2022-04-17 DIAGNOSIS — E785 Hyperlipidemia, unspecified: Secondary | ICD-10-CM

## 2022-04-17 DIAGNOSIS — Z950 Presence of cardiac pacemaker: Secondary | ICD-10-CM | POA: Diagnosis not present

## 2022-04-17 MED ORDER — CARVEDILOL 3.125 MG PO TABS: 3.1250 mg | ORAL_TABLET | Freq: Two times a day (BID) | ORAL | 5 refills | Status: DC

## 2022-04-17 NOTE — Progress Notes (Signed)
Cardiology Office Note   Date:  04/17/2022   ID:  Bethany Gillespie, DOB 22-Apr-1951, MRN [id number]  PCP:  Bethany Holms, NP  Cardiologist:   Bethany Sacramento, MD   Chief Complaint  Patient presents with   Other    6 Month f/u no complaints today. Meds reviewed verbally with pt.      History of Present Illness: Bethany Gillespie is a 71 y.o. female who presents for regarding chronic systolic heart failure due to nonischemic cardiomyopathy. This was diagnosed in July 2017 with an ejection fraction of 25-30%. Cardiac catheterization showed small vessel coronary artery disease with no obstructive disease affecting the main arteries. She had previous dual-chamber pacemaker placement in August 2016 in [place]. She has other chronic medical conditions that include diabetes mellitus and essential hypertension. She had a gradual improvement in ejection fraction on medical therapy to 50-55%.  She does have chronic kidney disease followed by nephrology .  She fell recently and injured her right knee.  She did not have syncope.  She has known history of labile hypertension.  She was having issues with low blood pressure last year and both amlodipine and carvedilol were discontinued.  She was seen recently by Dr. Candiss Gillespie on amlodipine 2.5 mg once daily was resumed.  She has been doing well from a cardiac standpoint with no chest pain, shortness of breath or palpitations.   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 s/p Engineer, agricultural PPM in 03/2015   Bell's palsy    Bradycardia    CAD (coronary artery disease)    a. 02/2016 Cath: LM nl, LAD 30p, D1 small w/ severe ostial/mid dzs, LCX nl, RCA nl, EF <25%, glob HK.   Chronic combined systolic and diastolic CHF (congestive heart failure) ([place])    a. Echo in 03/2015: EF 55-60%, Grade 1 DD;  b. 02/2016 Echo: 25-30%, mod LVH, Gr1 DD, mild MR, mildly dil LA, prominent apical trabeculations.   Diabetes ([place])    Hernia of  abdominal wall    HTN (hypertension)    Hypertension    NICM (nonischemic cardiomyopathy) ([place])    a. 02/2016 relatively non-obstructive cath, EF 25-30% by echo.    Past Surgical History:  Procedure Laterality Date   CARDIAC CATHETERIZATION Bilateral 03/01/2016   Procedure: Left Heart Cath and Coronary Angiography;  Surgeon: Bethany Merritts, MD;  Location: [place] [facility];  Service: Cardiovascular;  Laterality: Bilateral;   COLONOSCOPY WITH PROPOFOL N/A 05/06/2019   Procedure: COLONOSCOPY WITH PROPOFOL;  Surgeon: Gillespie, Bethany Pike, MD;  Location: [facility];  Service: Gastroenterology;  Laterality: N/A;   ESOPHAGOGASTRODUODENOSCOPY (EGD) WITH PROPOFOL N/A 05/06/2019   Procedure: ESOPHAGOGASTRODUODENOSCOPY (EGD) WITH PROPOFOL;  Surgeon: Gillespie, Bethany Pike, MD;  Location: [facility];  Service: Gastroenterology;  Laterality: N/A;   HERNIA REPAIR     INSERT / REPLACE / REMOVE PACEMAKER     PACEMAKER INSERTION     PACEMAKER INSERTION       Current Outpatient Medications  Medication Sig Dispense Refill   blood glucose meter kit and supplies by Other route as directed. Contour next Meter. Dispense based on patient and insurance preference. Use up to four times daily as directed. (FOR ICD-10 E10.9, E11.9).     carvedilol (COREG) 3.125 MG tablet Take 1 tablet (3.125 mg total) by mouth 2 (two) times daily with a meal. 180 tablet 3   Cholecalciferol 25 MCG (1000 UT) tablet Take 2,000 Units  by mouth daily.     Continuous Blood Gluc Receiver (FREESTYLE LIBRE 2 READER) DEVI 1 each by Does not apply route daily. 1 each 0   Continuous Blood Gluc Sensor (FREESTYLE LIBRE 2 SENSOR) MISC 1 each by Does not apply route every 14 (fourteen) days. 6 each 3   Diclofenac Sodium CR 100 MG 24 hr tablet Take 1 tablet (100 mg total) by mouth daily. 10 tablet 0   ferrous sulfate 325 (65 FE) MG tablet Take 325 mg by mouth daily with breakfast.     Glucagon 3 MG/DOSE POWD Place 3 mg into the nose once as  needed for up to 1 dose. 1 each 11   insulin glargine (LANTUS SOLOSTAR) 100 UNIT/ML Solostar Pen Inject 20 Units into the skin daily. 30 mL 3   insulin lispro (HUMALOG KWIKPEN) 100 UNIT/ML KwikPen Inject 4-8 Units into the skin 3 (three) times daily. (Patient taking differently: Inject 2-6 Units into the skin 3 (three) times daily. Pt taking PRN with meals) 15 mL 3   Insulin Pen Needle 31G X 6 MM MISC Use 4 times daily 150 each 3   potassium chloride (KLOR-CON) 10 MEQ tablet Take 10 mEq by mouth daily.     rosuvastatin (CRESTOR) 20 MG tablet Take 1 tablet (20 mg total) by mouth daily. 90 tablet 3   sacubitril-valsartan (ENTRESTO) 24-26 MG Take 1 tablet by mouth 2 (two) times daily. PLEASE KEEP SCHEDULED APPOINTMENT FOR FURTHER REFILLS 60 tablet 0   Semaglutide,0.25 or 0.5MG /DOS, (OZEMPIC, 0.25 OR 0.5 MG/DOSE,) 2 MG/1.5ML SOPN Inject 0.5 mg into the skin once a week. 4.5 mL 3   torsemide (DEMADEX) 20 MG tablet TAKE 1 TABLET(20 MG) BY MOUTH DAILY (Patient taking differently: Take 20 mg by mouth daily.) 90 tablet 1   No current facility-administered medications for this visit.    Allergies:   Bee venom and Atorvastatin    Social History:  The patient  reports that she has never smoked. She has never used smokeless tobacco. She reports that she does not currently use alcohol. She reports that she does not use drugs.   Family History:  The patient's family history includes Brain cancer in her brother; Diabetes in her brother; Diabetes Mellitus I in her grandchild; Skin cancer in her mother; Stroke in her father.    ROS:  Please see the history of present illness.   Otherwise, review of systems are positive for none.   All other systems are reviewed and negative.    PHYSICAL EXAM: VS:  BP 100/60 (BP Location: Left Arm, Patient Position: Sitting, Cuff Size: Normal)   Pulse 84   Ht 5\' 2"  (1.575 m)   Wt 136 lb 2 oz (61.7 kg) Comment: without boot  SpO2 98%   BMI 24.90 kg/m  , BMI Body mass  index is 24.9 kg/m. GEN: Well nourished, well developed, in no acute distress  HEENT: normal  Neck: no JVD, carotid bruits, or masses Cardiac: RRR; no murmurs, rubs, or gallops,no edema  Respiratory:  clear to auscultation bilaterally, normal work of breathing GI: soft, nontender, nondistended, + BS MS: no deformity or atrophy  Skin: warm and dry, no rash Neuro:  Strength and sensation are intact Psych: euthymic mood, full affect   EKG:  EKG is ordered today. EKG showed atrial sensed ventricular paced rhythm.   Recent Labs: 10/21/2021: ALT 16; BUN 47; Creatinine, Ser 2.47; Hemoglobin 11.0; Platelets 161; Potassium 3.1; Sodium 138    Lipid Panel    Component  Value Date/Time   CHOL 164 07/13/2020 1428   TRIG 133 07/13/2020 1428   HDL 61 07/13/2020 1428   CHOLHDL 2.7 07/13/2020 1428   VLDL 27 07/13/2020 1428   LDLCALC 76 07/13/2020 1428      Wt Readings from Last 3 Encounters:  04/17/22 136 lb 2 oz (61.7 kg)  03/12/22 142 lb (64.4 kg)  12/15/21 153 lb (69.4 kg)           No data to display            ASSESSMENT AND PLAN:  1.  Chronic systolic heart failure: Most recent ejection fraction was 50-55%. She is currently New York Heart Association class II.    She appears to be euvolemic on small dose torsemide with stable renal function.  She is no longer on carvedilol due to low blood pressure.  Given her chronic systolic heart failure, beta-blockers are preferred over calcium channel blockers.  Thus, I discontinued amlodipine and resume carvedilol at 3.125 mg twice daily.  Continue Entresto. Consider repeat echocardiogram in the near future to evaluate ejection fraction given ventricular pacing.   2. History of second-degree AV block status post dual-chamber pacemaker placement: This is followed by our device clinic.  Most recent interrogation was normal.  3. Diabetes mellitus, managed by primary care physician.  4.  Hyperlipidemia: History of intolerance to  atorvastatin but she is doing well with rosuvastatin.  Most recent lipid profile showed an LDL of 76.   Disposition:   FU with me in 4 months.  Signed,  Bethany Sacramento, MD  04/17/2022 2:24 PM    [place]

## 2022-04-17 NOTE — Patient Instructions (Signed)
Medication Instructions:   Your physician has recommended you make the following change in your medication:    STOP taking your Amlodipine.  2.    START taking Carvedilol (Coreg) 3.125 MG twice a day.  *If you need a refill on your cardiac medications before your next appointment, please call your pharmacy*    Follow-Up: At [facility], you and your health needs are our priority.  As part of our continuing mission to provide you with exceptional heart care, we have created designated Provider Care Teams.  These Care Teams include your primary Cardiologist (physician) and Advanced Practice Providers (APPs -  Physician Assistants and Nurse Practitioners) who all work together to provide you with the care you need, when you need it.  We recommend signing up for the patient portal called "MyChart".  Sign up information is provided on this After Visit Summary.  MyChart is used to connect with patients for Virtual Visits (Telemedicine).  Patients are able to view lab/test results, encounter notes, upcoming appointments, etc.  Non-urgent messages can be sent to your provider as well.   To learn more about what you can do with MyChart, go to [email or web address].    Your next appointment:   4 month(s)  The format for your next appointment:   In Person  Provider:   You may see Kathlyn Sacramento, MD or one of the following Advanced Practice Providers on your designated Care Team:   Murray Hodgkins, NP Christell Faith, PA-C Cadence Kathlen Mody, PA-C Gerrie Nordmann, NP    Other Instructions   Important Information About Sugar

## 2022-04-18 ENCOUNTER — Ambulatory Visit: Payer: Medicare Other | Admitting: Internal Medicine

## 2022-04-18 NOTE — Progress Notes (Deleted)
Patient ID: Bethany Gillespie, female   DOB: 12/30/1950, 71 y.o.   MRN: [id number]   HPI: Bethany Gillespie is a 71 y.o.-year-old femalele, initially referred by her previous endocrinologist, Dr. Gabriel Carina, returning for follow-up for DM2, dx in 2000s, insulin-dependent  - started several years later, uncontrolled, with complications (CAD, nonischemic CHF, diabetic retinopathy, peripheral neuropathy, CKD stage IV with microalbuminuria).  She was previously seeing Dr. Gabriel Carina but cannot drive to her office anymore due to her worsening vision she had to establish care with an endocrinologist in town.  Our last visit was 4 months ago  Interim hx: No increased urination, nausea, chest pain. She has blurry vision (macular degeneration). She cannot drive. Since last visit, she was in the emergency room after a fall on 03/12/2022.  She fractured her right patella.  Reviewed HbA1c levels: Lab Results  Component Value Date   HGBA1C 7.1 (A) 12/15/2021   HGBA1C 6.8 (A) 08/17/2021   HGBA1C 9.7 (A) 05/16/2021   HGBA1C 9.4 (A) 02/10/2021   HGBA1C 8.9 (A) 11/03/2020   HGBA1C 12.9 (H) 02/28/2016   HGBA1C 12.0 (H) 02/27/2016   HGBA1C 9.7 (H) 02/25/2014  12/2019: HbA1c 8.9%  Pt is on a regimen of: - Ozempic 0.25 >> 0.5 mg weekly - added 02/2021 - tolerates it well  - Lantus 40 >> 35 >> 25 >> 20 units at bedtime  - Humalog 12 units 3x a day, right before meals >>  >> 2 to 4 units before meals Taking 8-12 >> 4-8 units before meal >> 4-8 units during meals, only of the sugars before the meals are >120 (takes 8 units for BG >150) Prev. On Metformin and Actos.  Pt checks her sugars more than 4 times a day with her freestyle libre CGM:  Previously:   Previously:   Lowest sugar was 45 (CGM) >> LO; she has hypoglycemia awareness at 75s.  Highest sugar was 300s >> 300 (gatorade).  Pt's meals are: - Breakfast: oatmeal + berries, yoghurt - Lunch: salad or cheese + crackers, soup - Dinner: meat + veggies or starch -  Snacks: 3: nuts, popcorn  - + CKD with microalbuminuria - Dr. Candiss Norse, last BUN/creatinine:  Lab Results  Component Value Date   BUN 47 (H) 10/21/2021   BUN 26 (H) 12/27/2016   CREATININE 2.47 (H) 10/21/2021   CREATININE 1.84 (H) 10/20/2018  05/16/2019: 39/217, GFR 23, glucose 161, protein to creatinine ratio 0.156 (0.024-0.184) 09/14/2020: 20/1.9, GFR 26, glucose 174 05/24/2020: 25/2.1 glucose 118 She is on Entresto twice a day.  - + HL; last set of lipids: 08/2021: In PCPs office-pending results 09/14/2020: 166/145/61.4/76 Lab Results  Component Value Date   CHOL 164 07/13/2020   HDL 61 07/13/2020   LDLCALC 76 07/13/2020   TRIG 133 07/13/2020   CHOLHDL 2.7 07/13/2020  She is on Crestor 10 mg daily.  Managed by cardiology (Dr. Fletcher Anon).  - last eye exam 10/2020.  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 + IO injections.  - + numbness and tingling in her feet.  Pt has FH of DM in brother and grandson (DM1).  Latest TSH was normal, at 1.074 on 08/29/2018. No results found for: "TSH" She is on iron sulfate for anemia. She has a history of adrenal adenoma in 04/2018 >> c/w benign adenoma. She saw nephrology for a renal cyst.  No h/o pancreatitis. No FH of MTC.  ROS: + See HPI Constitutional:  + nocturia, + poor sleep Eyes: + Blurry  vision (macular degeneration), no xerophthalmia  Past Medical History:  Diagnosis Date   AV block, Mobitz 2    a. s/p Engineer, agricultural PPM in 03/2015   Bell's palsy    Bradycardia    CAD (coronary artery disease)    a. 02/2016 Cath: LM nl, LAD 30p, D1 small w/ severe ostial/mid dzs, LCX nl, RCA nl, EF <25%, glob HK.   Chronic combined systolic and diastolic CHF (congestive heart failure) ([place])    a. Echo in 03/2015: EF 55-60%, Grade 1 DD;  b. 02/2016 Echo: 25-30%, mod LVH, Gr1 DD, mild MR, mildly dil LA, prominent apical trabeculations.   Diabetes ([place])    Hernia of abdominal wall    HTN  (hypertension)    Hypertension    NICM (nonischemic cardiomyopathy) ([place])    a. 02/2016 relatively non-obstructive cath, EF 25-30% by echo.   Past Surgical History:  Procedure Laterality Date   CARDIAC CATHETERIZATION Bilateral 03/01/2016   Procedure: Left Heart Cath and Coronary Angiography;  Surgeon: Minna Merritts, MD;  Location: [place] [facility];  Service: Cardiovascular;  Laterality: Bilateral;   COLONOSCOPY WITH PROPOFOL N/A 05/06/2019   Procedure: COLONOSCOPY WITH PROPOFOL;  Surgeon: Toledo, Benay Pike, MD;  Location: [facility];  Service: Gastroenterology;  Laterality: N/A;   ESOPHAGOGASTRODUODENOSCOPY (EGD) WITH PROPOFOL N/A 05/06/2019   Procedure: ESOPHAGOGASTRODUODENOSCOPY (EGD) WITH PROPOFOL;  Surgeon: Toledo, Benay Pike, MD;  Location: [facility];  Service: Gastroenterology;  Laterality: N/A;   HERNIA REPAIR     INSERT / REPLACE / REMOVE PACEMAKER     PACEMAKER INSERTION     PACEMAKER INSERTION     Social History   Socioeconomic History   Marital status: Widowed    Spouse name: Not on file   Number of children: 5   Years of education: Not on file   Highest education level: Not on file  Occupational History   Not on file  Tobacco Use   Smoking status: Never   Smokeless tobacco: Never  Vaping Use   Vaping Use: Never used  Substance and Sexual Activity   Alcohol use: Not Currently   Drug use: No   Sexual activity: Not on file  Other Topics Concern   Not on file  Social History Narrative   Not on file   Social Determinants of Health   Financial Resource Strain: Not on file  Food Insecurity: Not on file  Transportation Needs: Not on file  Physical Activity: Not on file  Stress: Not on file  Social Connections: Not on file  Intimate Partner Violence: Not on file   Current Outpatient Medications on File Prior to Visit  Medication Sig Dispense Refill   blood glucose meter kit and supplies by Other route as directed. Contour next Meter. Dispense based  on patient and insurance preference. Use up to four times daily as directed. (FOR ICD-10 E10.9, E11.9).     carvedilol (COREG) 3.125 MG tablet Take 1 tablet (3.125 mg total) by mouth 2 (two) times daily with a meal. 60 tablet 5   Cholecalciferol 25 MCG (1000 UT) tablet Take 2,000 Units by mouth daily.     Continuous Blood Gluc Receiver (FREESTYLE LIBRE 2 READER) DEVI 1 each by Does not apply route daily. 1 each 0   Continuous Blood Gluc Sensor (FREESTYLE LIBRE 2 SENSOR) MISC 1 each by Does not apply route every 14 (fourteen) days. 6 each 3   Diclofenac Sodium CR 100 MG 24 hr tablet Take 1 tablet (100 mg  total) by mouth daily. 10 tablet 0   ferrous sulfate 325 (65 FE) MG tablet Take 325 mg by mouth daily with breakfast.     Glucagon 3 MG/DOSE POWD Place 3 mg into the nose once as needed for up to 1 dose. 1 each 11   insulin glargine (LANTUS SOLOSTAR) 100 UNIT/ML Solostar Pen Inject 20 Units into the skin daily. 30 mL 3   insulin lispro (HUMALOG KWIKPEN) 100 UNIT/ML KwikPen Inject 4-8 Units into the skin 3 (three) times daily. (Patient taking differently: Inject 2-6 Units into the skin 3 (three) times daily. Pt taking PRN with meals) 15 mL 3   Insulin Pen Needle 31G X 6 MM MISC Use 4 times daily 150 each 3   potassium chloride (KLOR-CON) 10 MEQ tablet Take 10 mEq by mouth daily.     rosuvastatin (CRESTOR) 20 MG tablet Take 1 tablet (20 mg total) by mouth daily. 90 tablet 3   sacubitril-valsartan (ENTRESTO) 24-26 MG Take 1 tablet by mouth 2 (two) times daily. PLEASE KEEP SCHEDULED APPOINTMENT FOR FURTHER REFILLS 60 tablet 0   Semaglutide,0.25 or 0.5MG /DOS, (OZEMPIC, 0.25 OR 0.5 MG/DOSE,) 2 MG/1.5ML SOPN Inject 0.5 mg into the skin once a week. 4.5 mL 3   torsemide (DEMADEX) 20 MG tablet TAKE 1 TABLET(20 MG) BY MOUTH DAILY (Patient taking differently: Take 20 mg by mouth daily.) 90 tablet 1   No current facility-administered medications on file prior to visit.   Allergies  Allergen Reactions   Bee  Venom Anaphylaxis   Atorvastatin Other (See Comments)    Severe leg cramping   Family History  Problem Relation Age of Onset   Diabetes Mellitus I Grandchild    Stroke Father    Diabetes Brother    Skin cancer Mother    Brain cancer Brother    PE: There were no vitals taken for this visit. Wt Readings from Last 3 Encounters:  04/17/22 136 lb 2 oz (61.7 kg)  03/12/22 142 lb (64.4 kg)  12/15/21 153 lb (69.4 kg)   Constitutional: normal weight, in NAD Eyes: EOMI, no exophthalmos ENT: moist mucous membranes, no thyromegaly, no cervical lymphadenopathy Cardiovascular: RRR, No MRG Respiratory: CTA B Musculoskeletal: no deformities Skin: moist, warm, no rashes, coarse skin on knuckles Neurological: no tremor with outstretched hands  ASSESSMENT: 1. DM2, insulin-dependent, uncontrolled, with long-term complications - CAD - nonischemic CHF - diabetic retinopathy - peripheral neuropathy - CKD stage IV with microalbuminuria  No family history of medullary thyroid cancer or personal history of pancreatitis.  2. HL  PLAN:  1. Patient with longstanding, uncontrolled, type 2 diabetes, on basal/bolus insulin regimen to which we added a GLP-1 receptor agonist in 2020.  Sugars improved significantly afterwards to the point of lows.  Since then, we are decreasing the doses of her insulins.  At last visit, sugars were decreasing overnight and remained mostly in the target range with occasional lows between meals and after dinner.  She had occasional spikes above 180s after meals, but the majority of the sugars were at goal.  I advised her to reduce the Lantus dose and also, if the sugars continues to drop I advised her to take Humalog only with larger meals.  We discussed about not taking Humalog if she plans to be very active after a meal.  I also advised her to let me know if she has any more lows.  HbA1c at that time was 7.1%, increased. -In 10/2021 she had a slightly high lipase per review  of the chart.  At that time, she presented with near syncope.  I am not sure why the lipase was checked.  There was no abdominal pain, nausea, or vomiting.  For now, we discussed about continuing with the Ozempic. -Since last visit, she lost 17 pounds! CGM interpretation: -At today's visit, we reviewed her CGM downloads: It appears that *** of values are in target range (goal >70%), while *** are higher than 180 (goal <25%), and *** are lower than 70 (goal <4%).  The calculated average blood sugar is ***.  The projected HbA1c for the next 3 months (GMI) is ***. -Reviewing the CGM trends, ***  - I suggested to:  Patient Instructions  Please continue: - Ozempic 0.5 mg weekly - Lantus 20 units at bedtime - Humalog before a meal: 2-4 units    If you plan to be active after a meal, you may not need Humalog before the previous meal.  Please return in 3-4 months.   - we checked her HbA1c: 7%  - advised to check sugars at different times of the day - 4x a day, rotating check times - advised for yearly eye exams >> she is UTD - return to clinic in 3-4 months  2. HL -Reviewed latest lipid panel from 07/2020: LDL above target of <55 due to cardiovascular disease, the rest of the fractions at goal Lab Results  Component Value Date   CHOL 164 07/13/2020   HDL 61 07/13/2020   LDLCALC 76 07/13/2020   TRIG 133 07/13/2020   CHOLHDL 2.7 07/13/2020  -She had another lipid panel in 08/2021.  I do not have these records -She continues on Crestor 10 mg daily-no side effects.  Philemon Kingdom, MD PhD [facility] Endocrinology

## 2022-05-08 ENCOUNTER — Telehealth: Payer: Self-pay | Admitting: Internal Medicine

## 2022-05-08 MED ORDER — OZEMPIC (0.25 OR 0.5 MG/DOSE) 2 MG/1.5ML ~~LOC~~ SOPN: 0.5000 mg | PEN_INJECTOR | SUBCUTANEOUS | 0 refills | Status: DC

## 2022-05-08 NOTE — Telephone Encounter (Signed)
MEDICATION: Ozempic .'5MG'$    PHARMACY:  [facility] on [address] CONTACTED THEIR PHARMACY?  YES,  IS THIS A 90 DAY SUPPLY : YES  IS PATIENT OUT OF MEDICATION: YES  IF NOT; HOW MUCH IS LEFT:   LAST APPOINTMENT DATE: '@05'$ /12/2021  NEXT APPOINTMENT DATE:'@10'$ /20/23  DO WE HAVE YOUR PERMISSION TO LEAVE A DETAILED MESSAGE?:  OTHER COMMENTS:    **Let patient know to contact pharmacy at the end of the day to make sure medication is ready. **  ** Please notify patient to allow 48-72 hours to process**  **Encourage patient to contact the pharmacy for refills or they can request refills through [facility]**

## 2022-05-08 NOTE — Telephone Encounter (Signed)
Done

## 2022-05-10 LAB — CUP PACEART REMOTE DEVICE CHECK
Battery Remaining Longevity: 72 mo
Battery Remaining Percentage: 70 %
Brady Statistic RA Percent Paced: 3 %
Brady Statistic RV Percent Paced: 99 %
Date Time Interrogation Session: 20230928023100
Implantable Lead Implant Date: 20160810
Implantable Lead Implant Date: 20160810
Implantable Lead Location: 753859
Implantable Lead Location: 753860
Implantable Lead Model: 7740
Implantable Lead Model: 7741
Implantable Lead Serial Number: 640659
Implantable Lead Serial Number: 682646
Implantable Pulse Generator Implant Date: 20160810
Lead Channel Impedance Value: 597 Ohm
Lead Channel Impedance Value: 774 Ohm
Lead Channel Setting Pacing Amplitude: 2 V
Lead Channel Setting Pacing Amplitude: 2.4 V
Lead Channel Setting Pacing Pulse Width: 0.5 ms
Lead Channel Setting Sensing Sensitivity: 2.5 mV
Pulse Gen Serial Number: 722311

## 2022-05-11 ENCOUNTER — Ambulatory Visit (INDEPENDENT_AMBULATORY_CARE_PROVIDER_SITE_OTHER): Payer: Medicare Other

## 2022-05-11 DIAGNOSIS — I441 Atrioventricular block, second degree: Secondary | ICD-10-CM

## 2022-05-16 NOTE — Progress Notes (Signed)
Remote pacemaker transmission.   

## 2022-05-31 NOTE — Progress Notes (Signed)
Patient ID: Bethany Gillespie, female   DOB: 1951/06/07, 71 y.o.   MRN: [id number]   HPI: Bethany Gillespie is a 71 y.o.-year-old female, initially referred by her previous endocrinologist, Dr. Gabriel Carina, returning for follow-up for DM2, dx in 2000s, insulin-dependent  - started several years later, uncontrolled, with complications (CAD, nonischemic CHF, diabetic retinopathy, peripheral neuropathy, CKD stage IV with microalbuminuria).  She was previously seeing Dr. Gabriel Carina but cannot drive to her office anymore due to her worsening vision she had to establish care with an endocrinologist in town.  Our last visit was 5 months ago  Interim hx: No increased urination, nausea, chest pain. She has blurry vision (macular degeneration). She cannot drive.  She sees her ophthalmologist every month. At last visit, after several severe hypoglycemic episodes, she was afraid to take Humalog before eating, but taking it after meals. In 02/2022, she fractured her patella.  She did not have to have surgery.  Reviewed HbA1c levels: Lab Results  Component Value Date   HGBA1C 7.1 (A) 12/15/2021   HGBA1C 6.8 (A) 08/17/2021   HGBA1C 9.7 (A) 05/16/2021   HGBA1C 9.4 (A) 02/10/2021   HGBA1C 8.9 (A) 11/03/2020   HGBA1C 12.9 (H) 02/28/2016   HGBA1C 12.0 (H) 02/27/2016   HGBA1C 9.7 (H) 02/25/2014  12/2019: HbA1c 8.9%  Pt is on a regimen of: - Ozempic 0.25 >> 0.5 mg weekly - added 02/2021- out for 3 weeks  - Lantus 40 >> 35 >> 25 >> 20 units at bedtime - Humalog 12 units 3x a day, right before meals >> 15 minutes before: Taking 8-12 >> 4-8 units before meal >> 4-8 units >> 2-4 units only if sugars >160 before the meal; takes the same amount for correction! Prev. On Metformin and Actos.  Pt checks her sugars more than 4 times a day with her freestyle libre CGM:   Prev.   Previously:   Lowest sugar was LO (after lunch) >> 45 (CGM) >> LO >> 30s; she has hypoglycemia awareness at 60s.  Highest sugar was 225 (after a  low) >> 300s >> 300 (gatorade) >> 200s.  Pt's meals are: - Breakfast: oatmeal + berries, yoghurt - Lunch: salad or cheese + crackers, soup - Dinner: meat + veggies or starch - Snacks: 3: nuts, popcorn  - + CKD with microalbuminuria - Dr. Candiss Norse, last BUN/creatinine:   Ref Range & Units 11/16/2021  Glucose 65 - 139 mg/dL 229 High    BUN 7 - 25 mg/dL 38 High    Creatinine 0.50 - 1.05 mg/dL 2.36 High    eGFR CKD-EPI CR 2021 > OR = 60 mL/min/1.66m2 22 Low    BUN/Creatinine Ratio 6 - 22 (calc) 16   Sodium 135 - 146 mmol/L 140   Potassium 3.5 - 5.3 mmol/L 3.3 Low    Chloride 98 - 110 mmol/L 102   Bicarbonate (CO2) 20 - 32 mmol/L 26   Calcium 8.6 - 10.4 mg/dL 9.1   Phosphorus 2.1 - 4.3 mg/dL 5.2 High    Albumin 3.6 - 5.1 g/dL 4.2    Lab Results  Component Value Date   BUN 47 (H) 10/21/2021   BUN 26 (H) 12/27/2016   CREATININE 2.47 (H) 10/21/2021   CREATININE 1.84 (H) 10/20/2018  05/16/2019: 39/217, GFR 23, glucose 161, protein to creatinine ratio 0.156 (0.024-0.184) 09/14/2020: 20/1.9, GFR 26, glucose 174 05/24/2020: 25/2.1 glucose 118 She is on Entresto twice a day.  - + HL; last set of lipids: 08/2021: In PCPs office-pending results.  Has appointment  tomorrow. 09/14/2020: 166/145/61.4/76 Lab Results  Component Value Date   CHOL 164 07/13/2020   HDL 61 07/13/2020   LDLCALC 76 07/13/2020   TRIG 133 07/13/2020   CHOLHDL 2.7 07/13/2020  She is on Crestor 10 mg daily.  Managed by cardiology (Dr. Fletcher Anon).  - last eye exam 05/2022.  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 + IO injections.  - + numbness and tingling in her feet.  She is planning to establish care with podiatry-she has nail psoriasis.   Pt has FH of DM in brother and grandson (DM1).  Latest TSH was normal, at 1.074 on 08/29/2018. No results found for: "TSH" She is on iron sulfate for anemia. She has a history of adrenal adenoma in 04/2018 >> c/w benign adenoma. She saw  nephrology for a renal cyst.  No h/o pancreatitis. No FH of MTC.  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 s/p Engineer, agricultural PPM in 03/2015   Bell's palsy    Bradycardia    CAD (coronary artery disease)    a. 02/2016 Cath: LM nl, LAD 30p, D1 small w/ severe ostial/mid dzs, LCX nl, RCA nl, EF <25%, glob HK.   Chronic combined systolic and diastolic CHF (congestive heart failure) ([place])    a. Echo in 03/2015: EF 55-60%, Grade 1 DD;  b. 02/2016 Echo: 25-30%, mod LVH, Gr1 DD, mild MR, mildly dil LA, prominent apical trabeculations.   Diabetes ([place])    Hernia of abdominal wall    HTN (hypertension)    Hypertension    NICM (nonischemic cardiomyopathy) ([place])    a. 02/2016 relatively non-obstructive cath, EF 25-30% by echo.   Past Surgical History:  Procedure Laterality Date   CARDIAC CATHETERIZATION Bilateral 03/01/2016   Procedure: Left Heart Cath and Coronary Angiography;  Surgeon: Minna Merritts, MD;  Location: [place] [facility];  Service: Cardiovascular;  Laterality: Bilateral;   COLONOSCOPY WITH PROPOFOL N/A 05/06/2019   Procedure: COLONOSCOPY WITH PROPOFOL;  Surgeon: Toledo, Benay Pike, MD;  Location: [facility];  Service: Gastroenterology;  Laterality: N/A;   ESOPHAGOGASTRODUODENOSCOPY (EGD) WITH PROPOFOL N/A 05/06/2019   Procedure: ESOPHAGOGASTRODUODENOSCOPY (EGD) WITH PROPOFOL;  Surgeon: Toledo, Benay Pike, MD;  Location: [facility];  Service: Gastroenterology;  Laterality: N/A;   HERNIA REPAIR     INSERT / REPLACE / REMOVE PACEMAKER     PACEMAKER INSERTION     PACEMAKER INSERTION     Social History   Socioeconomic History   Marital status: Widowed    Spouse name: Not on file   Number of children: 5   Years of education: Not on file   Highest education level: Not on file  Occupational History   Not on  file  Tobacco Use   Smoking status: Never   Smokeless tobacco: Never  Vaping Use   Vaping Use: Never used  Substance and Sexual Activity   Alcohol use: Not Currently   Drug use: No   Sexual activity: Not on file  Other Topics Concern   Not on file  Social History Narrative   Not on file   Social Determinants of Health   Financial Resource Strain: Not on file  Food Insecurity: Not on file  Transportation Needs: Not on file  Physical Activity: Not on file  Stress: Not on file  Social Connections: Not on file  Intimate Partner Violence: Not on file   Current Outpatient Medications on File Prior to Visit  Medication Sig Dispense Refill   blood glucose meter kit and supplies by Other route as directed. Contour next Meter. Dispense based on patient and insurance preference. Use up to four times daily as directed. (FOR ICD-10 E10.9, E11.9).     carvedilol (COREG) 3.125 MG tablet Take 1 tablet (3.125 mg total) by mouth 2 (two) times daily with a meal. 60 tablet 5   Cholecalciferol 25 MCG (1000 UT) tablet Take 2,000 Units by mouth daily.     Continuous Blood Gluc Receiver (FREESTYLE LIBRE 2 READER) DEVI 1 each by Does not apply route daily. 1 each 0   Continuous Blood Gluc Sensor (FREESTYLE LIBRE 2 SENSOR) MISC 1 each by Does not apply route every 14 (fourteen) days. 6 each 3   Diclofenac Sodium CR 100 MG 24 hr tablet Take 1 tablet (100 mg total) by mouth daily. 10 tablet 0   ferrous sulfate 325 (65 FE) MG tablet Take 325 mg by mouth daily with breakfast.     Glucagon 3 MG/DOSE POWD Place 3 mg into the nose once as needed for up to 1 dose. 1 each 11   insulin glargine (LANTUS SOLOSTAR) 100 UNIT/ML Solostar Pen Inject 20 Units into the skin daily. 30 mL 3   insulin lispro (HUMALOG KWIKPEN) 100 UNIT/ML KwikPen Inject 4-8 Units into the skin 3 (three) times daily. (Patient taking differently: Inject 2-6 Units into the skin 3 (three) times daily. Pt taking PRN with meals) 15 mL 3   Insulin  Pen Needle 31G X 6 MM MISC Use 4 times daily 150 each 3   potassium chloride (KLOR-CON) 10 MEQ tablet Take 10 mEq by mouth daily.     rosuvastatin (CRESTOR) 20 MG tablet Take 1 tablet (20 mg total) by mouth daily. 90 tablet 3   sacubitril-valsartan (ENTRESTO) 24-26 MG Take 1 tablet by mouth 2 (two) times daily. PLEASE KEEP SCHEDULED APPOINTMENT FOR FURTHER REFILLS 60 tablet 0   Semaglutide,0.25 or 0.5MG /DOS, (OZEMPIC, 0.25 OR 0.5 MG/DOSE,) 2 MG/1.5ML SOPN Inject 0.5 mg into the skin once a week. 4.5 mL 0   torsemide (DEMADEX) 20 MG tablet TAKE 1 TABLET(20 MG) BY MOUTH DAILY (Patient taking differently: Take 20 mg by mouth daily.) 90 tablet 1   No current facility-administered medications on file prior to visit.   Allergies  Allergen Reactions   Bee Venom Anaphylaxis   Atorvastatin Other (See Comments)    Severe leg cramping   Family History  Problem Relation Age of Onset   Diabetes Mellitus I Grandchild    Stroke Father    Diabetes Brother    Skin cancer Mother    Brain cancer Brother    PE: BP 120/72 (BP Location: Right Arm, Patient Position: Sitting, Cuff Size: Normal)   Pulse 75   Ht 5\' 2"  (1.575 m)   Wt 142 lb 6.4 oz (64.6 kg)   SpO2 97%   BMI 26.05 kg/m  Wt Readings from Last 3 Encounters:  06/01/22 142 lb 6.4 oz (64.6 kg)  04/17/22 136 lb 2 oz (61.7 kg)  03/12/22 142 lb (64.4 kg)   Constitutional: normal weight, in NAD Eyes:  EOMI, no exophthalmos ENT: no neck masses, no cervical lymphadenopathy Cardiovascular: RRR, No MRG Respiratory: CTA B Musculoskeletal: no deformities Skin:no rashes Neurological: no tremor with outstretched hands  ASSESSMENT: 1. DM2, insulin-dependent, uncontrolled, with long-term complications - CAD -  nonischemic CHF - diabetic retinopathy - peripheral neuropathy - CKD stage IV with microalbuminuria  No family history of medullary thyroid cancer or personal history of pancreatitis.  2. HL  PLAN:  1. Patient with longstanding,  uncontrolled, type 2 diabetes, on basal/bolus insulin regimen to which we added a GLP-1 receptor agonist in 02/2019.  Sugars improved significantly afterwards, to the point of lows.  At that time, we reduce her insulin doses.  However, before last visit, she had 2 severe low blood sugars detected by CGM.  She lost consciousness.  After the second episode, she was taken to the emergency room and given Gatorade.  Sugars increased to 300s.  Since then, she was afraid to take Humalog before meals and she was taking it after meals at last visit.  Reviewing her CGM tracings at that time, it appears that her sugars were decreasing overnight and they were remaining mostly in the target range, with only occasional lows between meals and after dinner.  The majority of the blood sugars were at goal.  I advised her to reduce both the Lantus and the Humalog doses and we discussed that if the sugars continue to drop, to take Humalog only with larger meals. -She has a glucagon kit at home CGM interpretation: -At today's visit, we reviewed her CGM downloads: It appears that 71% of values are in target range (goal >70%), while 12% are higher than 180 (goal <25%), and 17% are lower than 70 (goal <4%).  The calculated average blood sugar is 117.  The projected HbA1c for the next 3 months (GMI) is 6.1%. -Reviewing the CGM trends, sugars are very low overnight, frequently under 70, and they increase after every meal, but mostly within the target range, with occasional sugars about 200.  Sugars after dinner are dropping fairly abruptly. -To avoid low blood sugars overnight, I advised her to be less strict with correction of high blood sugars after meals , to only correct if sugars are >200 and, in days, do not take more than 1 to 2 units of Humalog.  Also, we can decrease the Lantus dose at night.  She is currently using Humalog only before also when sugars are 160 before the meal.  We discussed that she can also use 2 to 4 units if  she is larger meals, especially with the holidays coming up.  We will continue Ozempic, which she tolerates well. - I suggested to:  Patient Instructions  Please continue: - Ozempic 0.5 mg weekly - Humalog 2-4 units before a larger meal  Decrease: - Lantus 14 units at bedtime - Humalog for correction to 1-2 units    If you plan to be active after a meal, you may not need Humalog before the previous meal.  Please return in 3-4 months.   - we checked her HbA1c: 6.7%  (lower) - advised to check sugars at different times of the day - 4x a day, rotating check times - advised for yearly eye exams >> she is UTD - return to clinic in 3-4 months  2. HL -Reviewed her latest available lipid panel from 07/2020: LDL above target of less than 55 due to cardiovascular disease, the rest the fractions at goal: Lab Results  Component Value Date   CHOL 164 07/13/2020   HDL 61 07/13/2020   LDLCALC 76 07/13/2020   TRIG 133 07/13/2020   CHOLHDL 2.7 07/13/2020  -She had another lipid panel in 08/2021.  She has an appointment coming up with PCP tomorrow.  We will try to obtain the results. -She continues on Crestor 10 mg daily without side effects  Philemon Kingdom, MD PhD [facility] Endocrinology

## 2022-06-01 ENCOUNTER — Ambulatory Visit (INDEPENDENT_AMBULATORY_CARE_PROVIDER_SITE_OTHER): Payer: Medicare Other | Admitting: Internal Medicine

## 2022-06-01 ENCOUNTER — Encounter: Payer: Self-pay | Admitting: Internal Medicine

## 2022-06-01 VITALS — BP 120/72 | HR 75 | Ht 62.0 in | Wt 142.4 lb

## 2022-06-01 DIAGNOSIS — E1165 Type 2 diabetes mellitus with hyperglycemia: Secondary | ICD-10-CM

## 2022-06-01 DIAGNOSIS — E785 Hyperlipidemia, unspecified: Secondary | ICD-10-CM | POA: Diagnosis not present

## 2022-06-01 DIAGNOSIS — E1159 Type 2 diabetes mellitus with other circulatory complications: Secondary | ICD-10-CM

## 2022-06-01 LAB — POCT GLYCOSYLATED HEMOGLOBIN (HGB A1C): Hemoglobin A1C: 6.7 % — AB (ref 4.0–5.6)

## 2022-06-01 MED ORDER — LANTUS SOLOSTAR 100 UNIT/ML ~~LOC~~ SOPN: 14.0000 [IU] | PEN_INJECTOR | Freq: Every day | SUBCUTANEOUS | 3 refills | Status: DC

## 2022-06-01 NOTE — Patient Instructions (Signed)
Please continue: - Ozempic 0.5 mg weekly - Humalog 2-4 units before a larger meal  Decrease: - Lantus 14 units at bedtime - Humalog for correction to 1-2 units    If you plan to be active after a meal, you may not need Humalog before the previous meal.  Please return in 3-4 months.

## 2022-06-05 ENCOUNTER — Other Ambulatory Visit: Payer: Self-pay | Admitting: Registered Nurse

## 2022-06-05 DIAGNOSIS — E2839 Other primary ovarian failure: Secondary | ICD-10-CM

## 2022-07-04 ENCOUNTER — Other Ambulatory Visit: Payer: Self-pay | Admitting: Nephrology

## 2022-07-04 DIAGNOSIS — N184 Chronic kidney disease, stage 4 (severe): Secondary | ICD-10-CM

## 2022-07-04 DIAGNOSIS — E1122 Type 2 diabetes mellitus with diabetic chronic kidney disease: Secondary | ICD-10-CM

## 2022-07-04 DIAGNOSIS — N281 Cyst of kidney, acquired: Secondary | ICD-10-CM

## 2022-07-12 ENCOUNTER — Other Ambulatory Visit: Payer: Self-pay

## 2022-07-12 ENCOUNTER — Telehealth: Payer: Self-pay | Admitting: Cardiovascular Disease

## 2022-07-12 ENCOUNTER — Other Ambulatory Visit: Payer: Self-pay | Admitting: Cardiovascular Disease

## 2022-07-12 DIAGNOSIS — I5022 Chronic systolic (congestive) heart failure: Secondary | ICD-10-CM

## 2022-07-12 MED ORDER — ENTRESTO 24-26 MG PO TABS: 1.0000 | ORAL_TABLET | Freq: Two times a day (BID) | ORAL | 2 refills | Status: DC

## 2022-07-12 NOTE — Telephone Encounter (Signed)
 *  STAT* If patient is at the pharmacy, call can be transferred to refill team.   1. Which medications need to be refilled? (please list name of each medication and dose if known) sacubitril-valsartan (ENTRESTO) 24-26 MG   2. Which pharmacy/location (including street and city if local pharmacy) is medication to be sent to?[id number] - [place],  - [place]   3. Do they need a 30 day or 90 day supply? 90 days

## 2022-08-10 LAB — CUP PACEART REMOTE DEVICE CHECK
Battery Remaining Longevity: 66 mo
Battery Remaining Percentage: 65 %
Brady Statistic RA Percent Paced: 2 %
Brady Statistic RV Percent Paced: 99 %
Date Time Interrogation Session: 20231229023100
Implantable Lead Connection Status: 753985
Implantable Lead Connection Status: 753985
Implantable Lead Implant Date: 20160810
Implantable Lead Implant Date: 20160810
Implantable Lead Location: 753859
Implantable Lead Location: 753860
Implantable Lead Model: 7740
Implantable Lead Model: 7741
Implantable Lead Serial Number: 640659
Implantable Lead Serial Number: 682646
Implantable Pulse Generator Implant Date: 20160810
Lead Channel Impedance Value: 627 Ohm
Lead Channel Impedance Value: 752 Ohm
Lead Channel Setting Pacing Amplitude: 2 V
Lead Channel Setting Pacing Amplitude: 2.4 V
Lead Channel Setting Pacing Pulse Width: 0.5 ms
Lead Channel Setting Sensing Sensitivity: 2.5 mV
Pulse Gen Serial Number: 722311
Zone Setting Status: 755011

## 2022-08-20 ENCOUNTER — Ambulatory Visit (HOSPITAL_COMMUNITY)
Admission: RE | Admit: 2022-08-20 | Discharge: 2022-08-20 | Disposition: A | Discharge status: Home / Self Care | Payer: Medicare Other | Source: Ambulatory Visit | Attending: Nephrology | Admitting: Nephrology

## 2022-08-20 DIAGNOSIS — N281 Cyst of kidney, acquired: Secondary | ICD-10-CM | POA: Diagnosis present

## 2022-08-20 DIAGNOSIS — E1122 Type 2 diabetes mellitus with diabetic chronic kidney disease: Secondary | ICD-10-CM | POA: Diagnosis not present

## 2022-08-20 DIAGNOSIS — N184 Chronic kidney disease, stage 4 (severe): Secondary | ICD-10-CM

## 2022-08-20 MED ORDER — GADOBUTROL 1 MMOL/ML IV SOLN
6.4000 mL | Freq: Once | INTRAVENOUS | Status: AC | PRN
  Administered 2022-08-20: 6.4 mL via INTRAVENOUS

## 2022-08-20 NOTE — Progress Notes (Signed)
With Fajardo, Roderic Palau, and cardiology PA aware, patient is programmed to MRI protection mode at DOO 90 bpm. Will monitor patient during MRI. Will reprogram patient back to regular settings following scan. Nothing further needed at this time.

## 2022-08-22 ENCOUNTER — Other Ambulatory Visit: Payer: Self-pay | Admitting: Internal Medicine

## 2022-08-24 ENCOUNTER — Encounter: Payer: Self-pay | Admitting: Cardiovascular Disease

## 2022-08-24 ENCOUNTER — Ambulatory Visit: Payer: Medicare Other | Attending: Cardiovascular Disease | Admitting: Cardiovascular Disease

## 2022-08-24 VITALS — BP 148/76 | HR 68 | Ht 62.0 in | Wt 137.1 lb

## 2022-08-24 DIAGNOSIS — E785 Hyperlipidemia, unspecified: Secondary | ICD-10-CM | POA: Diagnosis not present

## 2022-08-24 DIAGNOSIS — R0989 Other specified symptoms and signs involving the circulatory and respiratory systems: Secondary | ICD-10-CM

## 2022-08-24 DIAGNOSIS — I441 Atrioventricular block, second degree: Secondary | ICD-10-CM | POA: Diagnosis not present

## 2022-08-24 DIAGNOSIS — I5022 Chronic systolic (congestive) heart failure: Secondary | ICD-10-CM

## 2022-08-24 MED ORDER — CARVEDILOL 6.25 MG PO TABS: 6.2500 mg | ORAL_TABLET | Freq: Two times a day (BID) | ORAL | 1 refills | Status: DC

## 2022-08-24 NOTE — Progress Notes (Signed)
Cardiology Office Note   Date:  08/24/2022   ID:  Bethany Gillespie, DOB 08-Oct-1950, MRN [id number]  PCP:  Arthur Holms, NP  Cardiologist:   Kathlyn Sacramento, MD   Chief Complaint  Patient presents with   Other    4 month f/u no complaints today. Meds reviewed verbally with pt.      History of Present Illness: Bethany Gillespie is a 72 y.o. female who presents for regarding chronic systolic heart failure due to nonischemic cardiomyopathy. This was diagnosed in July 2017 with an ejection fraction of 25-30%. Cardiac catheterization showed small vessel coronary artery disease with no obstructive disease affecting the main arteries. She had previous dual-chamber pacemaker placement in August 2016 in [place]. She has other chronic medical conditions that include diabetes mellitus and essential hypertension. She had a gradual improvement in ejection fraction on medical therapy to 50-55%.  She does have chronic kidney disease followed by nephrology .  During last visit, I switched her from amlodipine to small dose carvedilol.  Torsemide was subsequently discontinued by nephrology and she seems to be doing fine without it.  She has been doing well with no recent chest pain, shortness of breath or palpitations.  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 s/p Engineer, agricultural PPM in 03/2015   Bell's palsy    Bradycardia    CAD (coronary artery disease)    a. 02/2016 Cath: LM nl, LAD 30p, D1 small w/ severe ostial/mid dzs, LCX nl, RCA nl, EF <25%, glob HK.   Chronic combined systolic and diastolic CHF (congestive heart failure) ([place])    a. Echo in 03/2015: EF 55-60%, Grade 1 DD;  b. 02/2016 Echo: 25-30%, mod LVH, Gr1 DD, mild MR, mildly dil LA, prominent apical trabeculations.   Diabetes ([place])    Hernia of abdominal wall    HTN (hypertension)    Hypertension    NICM (nonischemic cardiomyopathy) ([place])    a. 02/2016 relatively non-obstructive cath, EF 25-30%  by echo.    Past Surgical History:  Procedure Laterality Date   CARDIAC CATHETERIZATION Bilateral 03/01/2016   Procedure: Left Heart Cath and Coronary Angiography;  Surgeon: Minna Merritts, MD;  Location: [place] [facility];  Service: Cardiovascular;  Laterality: Bilateral;   COLONOSCOPY WITH PROPOFOL N/A 05/06/2019   Procedure: COLONOSCOPY WITH PROPOFOL;  Surgeon: Toledo, Benay Pike, MD;  Location: [facility];  Service: Gastroenterology;  Laterality: N/A;   ESOPHAGOGASTRODUODENOSCOPY (EGD) WITH PROPOFOL N/A 05/06/2019   Procedure: ESOPHAGOGASTRODUODENOSCOPY (EGD) WITH PROPOFOL;  Surgeon: Toledo, Benay Pike, MD;  Location: [facility];  Service: Gastroenterology;  Laterality: N/A;   HERNIA REPAIR     INSERT / REPLACE / REMOVE PACEMAKER     PACEMAKER INSERTION     PACEMAKER INSERTION       Current Outpatient Medications  Medication Sig Dispense Refill   blood glucose meter kit and supplies by Other route as directed. Contour next Meter. Dispense based on patient and insurance preference. Use up to four times daily as directed. (FOR ICD-10 E10.9, E11.9).     Cholecalciferol 25 MCG (1000 UT) tablet Take 2,000 Units by mouth daily.     Continuous Blood Gluc Receiver (FREESTYLE LIBRE 2 READER) DEVI 1 each by Does not apply route daily. 1 each 0   Continuous Blood Gluc Sensor (FREESTYLE LIBRE 2 SENSOR) MISC 1 each by Does not apply route every 14 (fourteen) days. 6 each 3   Diclofenac Sodium CR  100 MG 24 hr tablet Take 1 tablet (100 mg total) by mouth daily. 10 tablet 0   ferrous sulfate 325 (65 FE) MG tablet Take 325 mg by mouth daily with breakfast.     Glucagon 3 MG/DOSE POWD Place 3 mg into the nose once as needed for up to 1 dose. 1 each 11   insulin glargine (LANTUS SOLOSTAR) 100 UNIT/ML Solostar Pen Inject 14 Units into the skin daily. 30 mL 3   insulin lispro (HUMALOG KWIKPEN) 100 UNIT/ML KwikPen Inject 4-8 Units into the skin 3 (three) times daily. (Patient taking differently:  Inject 2-6 Units into the skin 3 (three) times daily. Pt taking PRN with meals) 15 mL 3   Insulin Pen Needle 31G X 6 MM MISC Use 4 times daily 150 each 3   potassium chloride (KLOR-CON) 10 MEQ tablet Take 10 mEq by mouth daily.     sacubitril-valsartan (ENTRESTO) 24-26 MG Take 1 tablet by mouth 2 (two) times daily. PLEASE KEEP SCHEDULED APPOINTMENT FOR FURTHER REFILLS 60 tablet 2   Semaglutide,0.25 or 0.'5MG'$ /DOS, (OZEMPIC, 0.25 OR 0.5 MG/DOSE,) 2 MG/3ML SOPN INJECT 0.5 MG UNDER THE SKIN ONCE A WEEK 9 mL 1   carvedilol (COREG) 6.25 MG tablet Take 1 tablet (6.25 mg total) by mouth 2 (two) times daily with a meal. 180 tablet 1   No current facility-administered medications for this visit.    Allergies:   Bee venom and Atorvastatin    Social History:  The patient  reports that she has never smoked. She has never used smokeless tobacco. She reports that she does not currently use alcohol. She reports that she does not use drugs.   Family History:  The patient's family history includes Brain cancer in her brother; Diabetes in her brother; Diabetes Mellitus I in her grandchild; Skin cancer in her mother; Stroke in her father.    ROS:  Please see the history of present illness.   Otherwise, review of systems are positive for none.   All other systems are reviewed and negative.    PHYSICAL EXAM: VS:  BP (!) 148/80 (BP Location: Left Arm, Patient Position: Sitting, Cuff Size: Normal)   Pulse 68   Ht '5\' 2"'$  (1.575 m)   Wt 137 lb 2 oz (62.2 kg)   SpO2 98%   BMI 25.08 kg/m  , BMI Body mass index is 25.08 kg/m. GEN: Well nourished, well developed, in no acute distress  HEENT: normal  Neck: no JVD, or masses.  Right carotid bruit Cardiac: RRR; no murmurs, rubs, or gallops, trace bilateral leg edema Respiratory:  clear to auscultation bilaterally, normal work of breathing GI: soft, nontender, nondistended, + BS MS: no deformity or atrophy  Skin: warm and dry, no rash Neuro:  Strength and  sensation are intact Psych: euthymic mood, full affect   EKG:  EKG is not ordered today.   Recent Labs: 10/21/2021: ALT 16; BUN 47; Creatinine, Ser 2.47; Hemoglobin 11.0; Platelets 161; Potassium 3.1; Sodium 138    Lipid Panel    Component Value Date/Time   CHOL 164 07/13/2020 1428   TRIG 133 07/13/2020 1428   HDL 61 07/13/2020 1428   CHOLHDL 2.7 07/13/2020 1428   VLDL 27 07/13/2020 1428   LDLCALC 76 07/13/2020 1428      Wt Readings from Last 3 Encounters:  08/24/22 137 lb 2 oz (62.2 kg)  06/01/22 142 lb 6.4 oz (64.6 kg)  04/17/22 136 lb 2 oz (61.7 kg)  No data to display            ASSESSMENT AND PLAN:  1.  Chronic systolic heart failure: Most recent ejection fraction was 50-55%. She is currently New York Heart Association class II.   She continues to be euvolemic even after she was taken off torsemide.  Continue current dose of Entresto.  I elected to increase Coreg to 6.25 mg twice daily.  No spironolactone or SGLT2 inhibitor given chronic kidney disease with GFR around 25. I requested a repeat echocardiogram to evaluate ejection fraction especially in the setting of ventricular pacing.  2. History of second-degree AV block status post dual-chamber pacemaker placement: This is followed by our device clinic.  Most recent interrogation was normal.  3. Diabetes mellitus, managed by primary care physician.  4.  Hyperlipidemia: Rosuvastatin was discontinued due to myalgia.  5.  Right carotid bruit: I requested carotid Doppler.  6.  Essential hypertension: Blood pressure is elevated and was repeated.  I increased carvedilol as outlined above.   Disposition:   FU with me in 6 months.  Signed,  Kathlyn Sacramento, MD  08/24/2022 10:32 AM    [place]

## 2022-08-24 NOTE — Patient Instructions (Signed)
Medication Instructions:  INCREASE the Carvedilol to 6.'25mg'$  twice daily  *If you need a refill on your cardiac medications before your next appointment, please call your pharmacy*   Lab Work: None ordered If you have labs (blood work) drawn today and your tests are completely normal, you will receive your results only by: [place] (if you have MyChart) OR A paper copy in the mail If you have any lab test that is abnormal or we need to change your treatment, we will call you to review the results.   Testing/Procedures: Your physician has requested that you have a carotid duplex. This test is an ultrasound of the carotid arteries in your neck. It looks at blood flow through these arteries that supply the brain with blood.   Allow one hour for this exam.  There are no restrictions or special instructions.  This will take place at [facility] ([place]) [phone], [place]  Your physician has requested that you have an echocardiogram. Echocardiography is a painless test that uses sound waves to create images of your heart. It provides your doctor with information about the size and shape of your heart and how well your heart's chambers and valves are working.   You may receive an ultrasound enhancing agent through an IV if needed to better visualize your heart during the echo. This procedure takes approximately one hour.  There are no restrictions for this procedure.  This will take place at [facility] ([place]) [id number], [place]    Follow-Up: At [facility], you and your health needs are our priority.  As part of our continuing mission to provide you with exceptional heart care, we have created designated Provider Care Teams.  These Care Teams include your primary Cardiologist (physician) and Advanced Practice Providers (APPs -  Physician Assistants and Nurse Practitioners) who all work together to provide you with the  care you need, when you need it.  We recommend signing up for the patient portal called "MyChart".  Sign up information is provided on this After Visit Summary.  MyChart is used to connect with patients for Virtual Visits (Telemedicine).  Patients are able to view lab/test results, encounter notes, upcoming appointments, etc.  Non-urgent messages can be sent to your provider as well.   To learn more about what you can do with MyChart, go to [email or web address].    Your next appointment:   6 month(s)  Provider:   You may see Kathlyn Sacramento, MD or one of the following Advanced Practice Providers on your designated Care Team:   Murray Hodgkins, NP Christell Faith, PA-C Cadence Kathlen Mody, PA-C Gerrie Nordmann, NP

## 2022-09-10 ENCOUNTER — Ambulatory Visit: Payer: Medicare Other | Admitting: Internal Medicine

## 2022-09-10 NOTE — Progress Notes (Deleted)
Patient ID: Bethany Gillespie, female   DOB: 08/26/1950, 72 y.o.   MRN: [id number]   HPI: Bethany Gillespie is a 72 y.o.-year-old female, initially referred by her previous endocrinologist, Dr. Gabriel Carina, returning for follow-up for DM2, dx in 2000s, insulin-dependent  - started several years later, uncontrolled, with complications (CAD, nonischemic CHF, diabetic retinopathy, peripheral neuropathy, CKD stage IV with microalbuminuria).  She was previously seeing Dr. Gabriel Carina but cannot drive to her office anymore due to her worsening vision she had to establish care with an endocrinologist in town.  Our last visit was 5 months ago  Interim hx: No increased urination, nausea, chest pain. She has blurry vision (macular degeneration). She cannot drive.  She sees her ophthalmologist every month. At last visit, after several severe hypoglycemic episodes, she was afraid to take Humalog before eating, but taking it after meals.  Reviewed HbA1c levels: Lab Results  Component Value Date   HGBA1C 6.7 (A) 06/01/2022   HGBA1C 7.1 (A) 12/15/2021   HGBA1C 6.8 (A) 08/17/2021   HGBA1C 9.7 (A) 05/16/2021   HGBA1C 9.4 (A) 02/10/2021   HGBA1C 8.9 (A) 11/03/2020   HGBA1C 12.9 (H) 02/28/2016   HGBA1C 12.0 (H) 02/27/2016   HGBA1C 9.7 (H) 02/25/2014  12/2019: HbA1c 8.9%  Pt is on a regimen of: - Ozempic 0.25 >> 0.5 mg weekly - added 02/2021 - Lantus 40 >> 35 >> 25 >> 20 >> 14 units at bedtime - Humalog 12 units 3x a day, right before meals >> 15 minutes before: 4-8 units >> 2-4 units before meals Prev. On Metformin and Actos.  Pt checks her sugars more than 4 times a day with her freestyle libre CGM:  Previously:   Prev.   Lowest sugar was LO >> 30s; she has hypoglycemia awareness at 60s.  Highest sugar was 300 (gatorade) >> 200s.  Pt's meals are: - Breakfast: oatmeal + berries, yoghurt - Lunch: salad or cheese + crackers, soup - Dinner: meat + veggies or starch - Snacks: 3: nuts, popcorn  - + CKD with  microalbuminuria - Dr. Candiss Norse, last BUN/creatinine:  06/27/2022: 26/2.12, GFR 25, glucose 261 11/16/2021: 38/2.76, GFR 22, glucose 229 Lab Results  Component Value Date   BUN 47 (H) 10/21/2021   BUN 26 (H) 12/27/2016   CREATININE 2.47 (H) 10/21/2021   CREATININE 1.84 (H) 10/20/2018  05/16/2019: 39/217, GFR 23, glucose 161, protein to creatinine ratio 0.156 (0.024-0.184) 09/14/2020: 20/1.9, GFR 26, glucose 174 05/24/2020: 25/2.1 glucose 118 She is on Entresto twice a day.  - + HL; last set of lipids: 08/2021: In PCPs office-pending results.  Has appointment tomorrow. 09/14/2020: 166/145/61.4/76 Lab Results  Component Value Date   CHOL 164 07/13/2020   HDL 61 07/13/2020   LDLCALC 76 07/13/2020   TRIG 133 07/13/2020   CHOLHDL 2.7 07/13/2020  She is on Crestor 10 mg daily.  Managed by cardiology (Dr. Fletcher Anon).  - last eye exam 05/2022.  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 + IO injections.  - + numbness and tingling in her feet.  She is planning to establish care with podiatry-she has nail psoriasis.   Pt has FH of DM in brother and grandson (DM1).  Latest TSH was normal, at 1.074 on 08/29/2018. No results found for: "TSH" She is on iron sulfate for anemia. She has a history of adrenal adenoma in 04/2018 >> c/w benign adenoma. She saw nephrology for a renal cyst. In 02/2022, she fractured her patella.  She did not  have to have surgery.  No h/o pancreatitis. No FH of MTC.  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 s/p Engineer, agricultural PPM in 03/2015   Bell's palsy    Bradycardia    CAD (coronary artery disease)    a. 02/2016 Cath: LM nl, LAD 30p, D1 small w/ severe ostial/mid dzs, LCX nl, RCA nl, EF <25%, glob HK.   Chronic combined systolic and diastolic CHF (congestive  heart failure) ([place])    a. Echo in 03/2015: EF 55-60%, Grade 1 DD;  b. 02/2016 Echo: 25-30%, mod LVH, Gr1 DD, mild MR, mildly dil LA, prominent apical trabeculations.   Diabetes ([place])    Hernia of abdominal wall    HTN (hypertension)    Hypertension    NICM (nonischemic cardiomyopathy) ([place])    a. 02/2016 relatively non-obstructive cath, EF 25-30% by echo.   Past Surgical History:  Procedure Laterality Date   CARDIAC CATHETERIZATION Bilateral 03/01/2016   Procedure: Left Heart Cath and Coronary Angiography;  Surgeon: Minna Merritts, MD;  Location: [facility];  Service: Cardiovascular;  Laterality: Bilateral;   COLONOSCOPY WITH PROPOFOL N/A 05/06/2019   Procedure: COLONOSCOPY WITH PROPOFOL;  Surgeon: Toledo, Benay Pike, MD;  Location: [facility];  Service: Gastroenterology;  Laterality: N/A;   ESOPHAGOGASTRODUODENOSCOPY (EGD) WITH PROPOFOL N/A 05/06/2019   Procedure: ESOPHAGOGASTRODUODENOSCOPY (EGD) WITH PROPOFOL;  Surgeon: Toledo, Benay Pike, MD;  Location: [facility];  Service: Gastroenterology;  Laterality: N/A;   HERNIA REPAIR     INSERT / REPLACE / REMOVE PACEMAKER     PACEMAKER INSERTION     PACEMAKER INSERTION     Social History   Socioeconomic History   Marital status: Widowed    Spouse name: Not on file   Number of children: 5   Years of education: Not on file   Highest education level: Not on file  Occupational History   Not on file  Tobacco Use   Smoking status: Never   Smokeless tobacco: Never  Vaping Use   Vaping Use: Never used  Substance and Sexual Activity   Alcohol use: Not Currently   Drug use: No   Sexual activity: Not on file  Other Topics Concern   Not on file  Social History Narrative   Not on file   Social Determinants of Health   Financial Resource Strain: Not on file  Food Insecurity: Not on file  Transportation Needs: Not on file  Physical Activity: Not on file  Stress: Not on file  Social Connections: Not on file  Intimate  Partner Violence: Not on file   Current Outpatient Medications on File Prior to Visit  Medication Sig Dispense Refill   blood glucose meter kit and supplies by Other route as directed. Contour next Meter. Dispense based on patient and insurance preference. Use up to four times daily as directed. (FOR ICD-10 E10.9, E11.9).     carvedilol (COREG) 6.25 MG tablet Take 1 tablet (6.25 mg total) by mouth 2 (two) times daily with a meal. 180 tablet 1   Cholecalciferol 25 MCG (1000 UT) tablet Take 2,000 Units by mouth daily.     Continuous Blood Gluc Receiver (FREESTYLE LIBRE 2 READER) DEVI 1 each by Does not apply route daily. 1 each 0   Continuous Blood Gluc Sensor (FREESTYLE LIBRE 2 SENSOR) MISC 1 each  by Does not apply route every 14 (fourteen) days. 6 each 3   Diclofenac Sodium CR 100 MG 24 hr tablet Take 1 tablet (100 mg total) by mouth daily. 10 tablet 0   ferrous sulfate 325 (65 FE) MG tablet Take 325 mg by mouth daily with breakfast.     Glucagon 3 MG/DOSE POWD Place 3 mg into the nose once as needed for up to 1 dose. 1 each 11   insulin glargine (LANTUS SOLOSTAR) 100 UNIT/ML Solostar Pen Inject 14 Units into the skin daily. 30 mL 3   insulin lispro (HUMALOG KWIKPEN) 100 UNIT/ML KwikPen Inject 4-8 Units into the skin 3 (three) times daily. (Patient taking differently: Inject 2-6 Units into the skin 3 (three) times daily. Pt taking PRN with meals) 15 mL 3   Insulin Pen Needle 31G X 6 MM MISC Use 4 times daily 150 each 3   potassium chloride (KLOR-CON) 10 MEQ tablet Take 10 mEq by mouth daily.     sacubitril-valsartan (ENTRESTO) 24-26 MG Take 1 tablet by mouth 2 (two) times daily. PLEASE KEEP SCHEDULED APPOINTMENT FOR FURTHER REFILLS 60 tablet 2   Semaglutide,0.25 or 0.'5MG'$ /DOS, (OZEMPIC, 0.25 OR 0.5 MG/DOSE,) 2 MG/3ML SOPN INJECT 0.5 MG UNDER THE SKIN ONCE A WEEK 9 mL 1   No current facility-administered medications on file prior to visit.   Allergies  Allergen Reactions   Bee Venom Anaphylaxis    Atorvastatin Other (See Comments)    Severe leg cramping   Family History  Problem Relation Age of Onset   Diabetes Mellitus I Grandchild    Stroke Father    Diabetes Brother    Skin cancer Mother    Brain cancer Brother    PE: There were no vitals taken for this visit. Wt Readings from Last 3 Encounters:  08/24/22 137 lb 2 oz (62.2 kg)  06/01/22 142 lb 6.4 oz (64.6 kg)  04/17/22 136 lb 2 oz (61.7 kg)   Constitutional: normal weight, in NAD Eyes:  EOMI, no exophthalmos ENT: no neck masses, no cervical lymphadenopathy Cardiovascular: RRR, No MRG Respiratory: CTA B Musculoskeletal: no deformities Skin:no rashes Neurological: no tremor with outstretched hands  ASSESSMENT: 1. DM2, insulin-dependent, uncontrolled, with long-term complications - CAD - nonischemic CHF - diabetic retinopathy - peripheral neuropathy - CKD stage IV with microalbuminuria  No family history of medullary thyroid cancer or personal history of pancreatitis.  2. HL  PLAN:  1. Patient with longstanding, uncontrolled, type 2 diabetes, on basal/bolus insulin regimen, to which we added a GLP-1 receptor agonist in 02/2019.  Sugars improved significantly afterwards, to the point of lows.  We reduced her insulin doses afterwards.  At last visit, sugars were quite low overnight, frequently under 70 and they were increasing after every meal, but mostly within the target range, with only occasional sugars above 200.  Sugars after dinner were dropping fairly abruptly.  I advised her to be less strict with correction of high blood sugars after meals and only correct if sugars are higher than 200 and only take 1 to 2 units of Humalog for correction.  We also decreased the Lantus dose at night and I advised her to use 2 to 4 units of Humalog for larger meals and also when sugars are at or close to goal before the meal as she was only using Humalog before meals when sugars were 160 or above.  We continued Ozempic. CGM  interpretation: -At today's visit, we reviewed her CGM downloads: It appears that ***  of values are in target range (goal >70%), while *** are higher than 180 (goal <25%), and *** are lower than 70 (goal <4%).  The calculated average blood sugar is ***.  The projected HbA1c for the next 3 months (GMI) is ***. -Reviewing the CGM trends, *** - I suggested to:  Patient Instructions  Please continue: - Ozempic 0.5 mg weekly - Humalog 2-4 units before a larger meal and  1-2 units for correction  If you plan to be active after a meal, you may not need Humalog before the previous meal. - Lantus 14 units at bedtime  Please return in 3-4 months.   - we checked her HbA1c: 7%  - advised to check sugars at different times of the day - 4x a day, rotating check times - advised for yearly eye exams >> she is UTD - return to clinic in 3-4 months  2. HL -Reviewed latest lipid panel from 07/2020: Fractions at goal with the exception of an LDL higher than 65, which is our target due to history of cardiovascular disease: Lab Results  Component Value Date   CHOL 164 07/13/2020   HDL 61 07/13/2020   LDLCALC 76 07/13/2020   TRIG 133 07/13/2020   CHOLHDL 2.7 07/13/2020  -She had another lipid panel with PCP in 08/2021 but I do not have these results -Continues on Crestor 10 mg daily side effects  Philemon Kingdom, MD PhD [facility] End[facility]

## 2022-09-20 ENCOUNTER — Other Ambulatory Visit: Payer: Self-pay

## 2022-09-20 DIAGNOSIS — E1159 Type 2 diabetes mellitus with other circulatory complications: Secondary | ICD-10-CM

## 2022-09-20 MED ORDER — LANTUS SOLOSTAR 100 UNIT/ML ~~LOC~~ SOPN: 14.0000 [IU] | PEN_INJECTOR | Freq: Every day | SUBCUTANEOUS | 0 refills | Status: DC

## 2022-10-05 ENCOUNTER — Ambulatory Visit (INDEPENDENT_AMBULATORY_CARE_PROVIDER_SITE_OTHER): Payer: 59

## 2022-10-05 ENCOUNTER — Other Ambulatory Visit
Admission: RE | Admit: 2022-10-05 | Discharge: 2022-10-05 | Disposition: A | Discharge status: Home / Self Care | Payer: 59 | Attending: Nephrology | Admitting: Nephrology

## 2022-10-05 ENCOUNTER — Ambulatory Visit: Payer: 59 | Attending: Cardiovascular Disease

## 2022-10-05 DIAGNOSIS — E1122 Type 2 diabetes mellitus with diabetic chronic kidney disease: Secondary | ICD-10-CM | POA: Diagnosis present

## 2022-10-05 DIAGNOSIS — N184 Chronic kidney disease, stage 4 (severe): Secondary | ICD-10-CM | POA: Insufficient documentation

## 2022-10-05 DIAGNOSIS — I5022 Chronic systolic (congestive) heart failure: Secondary | ICD-10-CM | POA: Diagnosis not present

## 2022-10-05 DIAGNOSIS — R0989 Other specified symptoms and signs involving the circulatory and respiratory systems: Secondary | ICD-10-CM | POA: Diagnosis not present

## 2022-10-05 DIAGNOSIS — N2581 Secondary hyperparathyroidism of renal origin: Secondary | ICD-10-CM | POA: Diagnosis not present

## 2022-10-05 DIAGNOSIS — N281 Cyst of kidney, acquired: Secondary | ICD-10-CM | POA: Diagnosis not present

## 2022-10-05 DIAGNOSIS — I129 Hypertensive chronic kidney disease with stage 1 through stage 4 chronic kidney disease, or unspecified chronic kidney disease: Secondary | ICD-10-CM | POA: Insufficient documentation

## 2022-10-05 DIAGNOSIS — R600 Localized edema: Secondary | ICD-10-CM | POA: Diagnosis not present

## 2022-10-05 DIAGNOSIS — N2889 Other specified disorders of kidney and ureter: Secondary | ICD-10-CM | POA: Diagnosis not present

## 2022-10-05 LAB — PROTEIN / CREATININE RATIO, URINE
Creatinine, Urine: 119 mg/dL
Protein Creatinine Ratio: 0.66 mg/mg{Cre} — ABNORMAL HIGH (ref 0.00–0.15)
Total Protein, Urine: 78 mg/dL

## 2022-10-05 LAB — CBC WITH DIFFERENTIAL/PLATELET
Abs Immature Granulocytes: 0.01 10*3/uL (ref 0.00–0.07)
Basophils Absolute: 0.1 10*3/uL (ref 0.0–0.1)
Basophils Relative: 1 %
Eosinophils Absolute: 0.2 10*3/uL (ref 0.0–0.5)
Eosinophils Relative: 3 %
HCT: 33.1 % — ABNORMAL LOW (ref 36.0–46.0)
Hemoglobin: 10.9 g/dL — ABNORMAL LOW (ref 12.0–15.0)
Immature Granulocytes: 0 %
Lymphocytes Relative: 33 %
Lymphs Abs: 2.5 10*3/uL (ref 0.7–4.0)
MCH: 28.4 pg (ref 26.0–34.0)
MCHC: 32.9 g/dL (ref 30.0–36.0)
MCV: 86.2 fL (ref 80.0–100.0)
Monocytes Absolute: 0.4 10*3/uL (ref 0.1–1.0)
Monocytes Relative: 6 %
Neutro Abs: 4.2 10*3/uL (ref 1.7–7.7)
Neutrophils Relative %: 57 %
Platelets: 196 10*3/uL (ref 150–400)
RBC: 3.84 MIL/uL — ABNORMAL LOW (ref 3.87–5.11)
RDW: 14.2 % (ref 11.5–15.5)
WBC: 7.4 10*3/uL (ref 4.0–10.5)
nRBC: 0 % (ref 0.0–0.2)

## 2022-10-05 LAB — URINALYSIS, COMPLETE (UACMP) WITH MICROSCOPIC
Bilirubin Urine: NEGATIVE
Glucose, UA: NEGATIVE mg/dL
Hgb urine dipstick: NEGATIVE
Ketones, ur: NEGATIVE mg/dL
Nitrite: NEGATIVE
Protein, ur: 100 mg/dL — AB
Specific Gravity, Urine: 1.014 (ref 1.005–1.030)
pH: 5 (ref 5.0–8.0)

## 2022-10-05 LAB — RENAL FUNCTION PANEL
Albumin: 3.8 g/dL (ref 3.5–5.0)
Anion gap: 11 (ref 5–15)
BUN: 24 mg/dL — ABNORMAL HIGH (ref 8–23)
CO2: 21 mmol/L — ABNORMAL LOW (ref 22–32)
Calcium: 9 mg/dL (ref 8.9–10.3)
Chloride: 108 mmol/L (ref 98–111)
Creatinine, Ser: 1.85 mg/dL — ABNORMAL HIGH (ref 0.44–1.00)
GFR, Estimated: 29 mL/min — ABNORMAL LOW (ref >60)
Glucose, Bld: 116 mg/dL — ABNORMAL HIGH (ref 70–99)
Phosphorus: 3.7 mg/dL (ref 2.5–4.6)
Potassium: 3.7 mmol/L (ref 3.5–5.1)
Sodium: 140 mmol/L (ref 135–145)

## 2022-10-06 LAB — ECHOCARDIOGRAM COMPLETE
Area-P 1/2: 6.43 cm2
Calc EF: 49.5 %
S' Lateral: 3.8 cm
Single Plane A2C EF: 54.5 %
Single Plane A4C EF: 46.4 %

## 2022-10-10 ENCOUNTER — Telehealth: Payer: Self-pay | Admitting: Physician Assistant

## 2022-10-10 ENCOUNTER — Encounter: Payer: Self-pay | Admitting: Physician Assistant

## 2022-10-10 ENCOUNTER — Telehealth: Payer: Self-pay | Admitting: Gastroenterology

## 2022-10-10 ENCOUNTER — Other Ambulatory Visit: Payer: Self-pay

## 2022-10-10 ENCOUNTER — Inpatient Hospital Stay: Payer: 59 | Attending: Physician Assistant | Admitting: Physician Assistant

## 2022-10-10 ENCOUNTER — Inpatient Hospital Stay: Payer: 59

## 2022-10-10 VITALS — BP 159/82 | HR 69 | Temp 98.1°F | Resp 15 | Ht 62.0 in | Wt 133.5 lb

## 2022-10-10 DIAGNOSIS — Z79899 Other long term (current) drug therapy: Secondary | ICD-10-CM | POA: Insufficient documentation

## 2022-10-10 DIAGNOSIS — R110 Nausea: Secondary | ICD-10-CM | POA: Diagnosis not present

## 2022-10-10 DIAGNOSIS — Z833 Family history of diabetes mellitus: Secondary | ICD-10-CM | POA: Diagnosis not present

## 2022-10-10 DIAGNOSIS — R9389 Abnormal findings on diagnostic imaging of other specified body structures: Secondary | ICD-10-CM | POA: Insufficient documentation

## 2022-10-10 DIAGNOSIS — K8689 Other specified diseases of pancreas: Secondary | ICD-10-CM | POA: Diagnosis present

## 2022-10-10 DIAGNOSIS — E119 Type 2 diabetes mellitus without complications: Secondary | ICD-10-CM | POA: Insufficient documentation

## 2022-10-10 DIAGNOSIS — Z794 Long term (current) use of insulin: Secondary | ICD-10-CM | POA: Diagnosis not present

## 2022-10-10 DIAGNOSIS — Z803 Family history of malignant neoplasm of breast: Secondary | ICD-10-CM | POA: Insufficient documentation

## 2022-10-10 DIAGNOSIS — R0602 Shortness of breath: Secondary | ICD-10-CM | POA: Insufficient documentation

## 2022-10-10 DIAGNOSIS — I110 Hypertensive heart disease with heart failure: Secondary | ICD-10-CM | POA: Diagnosis not present

## 2022-10-10 DIAGNOSIS — R634 Abnormal weight loss: Secondary | ICD-10-CM | POA: Diagnosis not present

## 2022-10-10 DIAGNOSIS — Z91041 Radiographic dye allergy status: Secondary | ICD-10-CM | POA: Diagnosis not present

## 2022-10-10 DIAGNOSIS — Z91030 Bee allergy status: Secondary | ICD-10-CM | POA: Insufficient documentation

## 2022-10-10 DIAGNOSIS — I5042 Chronic combined systolic (congestive) and diastolic (congestive) heart failure: Secondary | ICD-10-CM | POA: Insufficient documentation

## 2022-10-10 DIAGNOSIS — K862 Cyst of pancreas: Secondary | ICD-10-CM | POA: Insufficient documentation

## 2022-10-10 DIAGNOSIS — Z823 Family history of stroke: Secondary | ICD-10-CM | POA: Insufficient documentation

## 2022-10-10 DIAGNOSIS — Z808 Family history of malignant neoplasm of other organs or systems: Secondary | ICD-10-CM | POA: Insufficient documentation

## 2022-10-10 LAB — CBC WITH DIFFERENTIAL (CANCER CENTER ONLY)
Abs Immature Granulocytes: 0.02 10*3/uL (ref 0.00–0.07)
Basophils Absolute: 0.1 10*3/uL (ref 0.0–0.1)
Basophils Relative: 1 %
Eosinophils Absolute: 0.2 10*3/uL (ref 0.0–0.5)
Eosinophils Relative: 3 %
HCT: 31.8 % — ABNORMAL LOW (ref 36.0–46.0)
Hemoglobin: 10.7 g/dL — ABNORMAL LOW (ref 12.0–15.0)
Immature Granulocytes: 0 %
Lymphocytes Relative: 34 %
Lymphs Abs: 2.3 10*3/uL (ref 0.7–4.0)
MCH: 28.9 pg (ref 26.0–34.0)
MCHC: 33.6 g/dL (ref 30.0–36.0)
MCV: 85.9 fL (ref 80.0–100.0)
Monocytes Absolute: 0.5 10*3/uL (ref 0.1–1.0)
Monocytes Relative: 7 %
Neutro Abs: 3.6 10*3/uL (ref 1.7–7.7)
Neutrophils Relative %: 55 %
Platelet Count: 184 10*3/uL (ref 150–400)
RBC: 3.7 MIL/uL — ABNORMAL LOW (ref 3.87–5.11)
RDW: 14.1 % (ref 11.5–15.5)
WBC Count: 6.7 10*3/uL (ref 4.0–10.5)
nRBC: 0 % (ref 0.0–0.2)

## 2022-10-10 LAB — CMP (CANCER CENTER ONLY)
ALT: 12 U/L (ref 0–44)
AST: 10 U/L — ABNORMAL LOW (ref 15–41)
Albumin: 4.1 g/dL (ref 3.5–5.0)
Alkaline Phosphatase: 49 U/L (ref 38–126)
Anion gap: 7 (ref 5–15)
BUN: 24 mg/dL — ABNORMAL HIGH (ref 8–23)
CO2: 25 mmol/L (ref 22–32)
Calcium: 8.7 mg/dL — ABNORMAL LOW (ref 8.9–10.3)
Chloride: 110 mmol/L (ref 98–111)
Creatinine: 1.57 mg/dL — ABNORMAL HIGH (ref 0.44–1.00)
GFR, Estimated: 35 mL/min — ABNORMAL LOW (ref >60)
Glucose, Bld: 125 mg/dL — ABNORMAL HIGH (ref 70–99)
Potassium: 3.7 mmol/L (ref 3.5–5.1)
Sodium: 142 mmol/L (ref 135–145)
Total Bilirubin: 0.6 mg/dL (ref 0.3–1.2)
Total Protein: 6.1 g/dL — ABNORMAL LOW (ref 6.5–8.1)

## 2022-10-10 LAB — CEA (ACCESS): CEA (CHCC): 3.81 ng/mL (ref 0.00–5.00)

## 2022-10-10 NOTE — Telephone Encounter (Signed)
Scheduled appt per 2/27 referral. Pt is aware of appt date and time. Pt is aware to arrive 15 mins prior to appt time and to bring and updated insurance card. Pt is aware of appt location.   °

## 2022-10-10 NOTE — Telephone Encounter (Signed)
Dr. Rush Landmark,  Urgent referral in [place] from oncology for a EUS for a pancreatic mass.  Records in EPIC.  Please review and advise scheduling.  Thanks

## 2022-10-10 NOTE — Progress Notes (Signed)
[place] [phone]   [phone]  INITIAL CONSULTATION:  Patient Care Team: Arthur Holms, NP as PCP - General (Nurse Practitioner) Wellington Hampshire, MD as PCP - Cardiology (Cardiology) Alisa Graff, FNP as Nurse Practitioner (Family Medicine) Gabriel Carina Betsey Holiday, MD as Physician Assistant (Internal Medicine) Clent Jacks, RN as Oncology Nurse Navigator  CHIEF COMPLAINTS/PURPOSE OF CONSULTATION:  Pancreatic mass  HISTORY OF PRESENTING ILLNESS:  Verbie Boutros 72 y.o. female with medical history significant for CHF, diabetes and hypertension presents to the diagnostic clinic for evaluation of pancreatic mass. She is unaccompanied for this visit.   On review of the previous records, Ms. Glauner underwent MR of the abdomen on 08/16/2022 to follow multilobulatar cystic lesion of the pancreatic head/uncinate process and right renal lesion seen on prior exams. Findings showed  substantial enlargement of multilocular cystic lesion in the pancreatic head and uncinate process, concerning for neoplasm.  On exam today, Ms. Reichel reports that her energy levels are stable. She goes for walks daily and is able to complete all her ADLs on her own. She reports a 5-6 lb weight loss since January 2024 but contributes this to possible GI infection with nausea and dry heaves. She is now feeling better with her appetite back to baseline. She denies any nausea, vomiting or abdominal pain. Her bowel habits are unchanged with occasional episodes of diarrhea or constipation. She denies easy bruising or signs of active bleeding. She denies fevers, chills, sweats, shortness of breath, chest pain or cough. She has no other complaints. Rest of the 10 point ROS is below.   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 s/p Engineer, agricultural PPM in 03/2015   Bell's palsy    Bradycardia    CAD  (coronary artery disease)    a. 02/2016 Cath: LM nl, LAD 30p, D1 small w/ severe ostial/mid dzs, LCX nl, RCA nl, EF <25%, glob HK.   Chronic combined systolic and diastolic CHF (congestive heart failure) ([place])    a. Echo in 03/2015: EF 55-60%, Grade 1 DD;  b. 02/2016 Echo: 25-30%, mod LVH, Gr1 DD, mild MR, mildly dil LA, prominent apical trabeculations.   Diabetes ([place])    Hernia of abdominal wall    HTN (hypertension)    Hypertension    NICM (nonischemic cardiomyopathy) ([place])    a. 02/2016 relatively non-obstructive cath, EF 25-30% by echo.    SURGICAL HISTORY: Past Surgical History:  Procedure Laterality Date   CARDIAC CATHETERIZATION Bilateral 03/01/2016   Procedure: Left Heart Cath and Coronary Angiography;  Surgeon: Minna Merritts, MD;  Location: [facility];  Service: Cardiovascular;  Laterality: Bilateral;   COLONOSCOPY WITH PROPOFOL N/A 05/06/2019   Procedure: COLONOSCOPY WITH PROPOFOL;  Surgeon: Toledo, Benay Pike, MD;  Location: [facility];  Service: Gastroenterology;  Laterality: N/A;   ESOPHAGOGASTRODUODENOSCOPY (EGD) WITH PROPOFOL N/A 05/06/2019   Procedure: ESOPHAGOGASTRODUODENOSCOPY (EGD) WITH PROPOFOL;  Surgeon: Toledo, Benay Pike, MD;  Location: [facility];  Service: Gastroenterology;  Laterality: N/A;   HERNIA REPAIR     INSERT / REPLACE / REMOVE PACEMAKER     PACEMAKER INSERTION     PACEMAKER INSERTION      SOCIAL HISTORY: Social History   Socioeconomic History   Marital status: Widowed    Spouse name: Not on file   Number of children: 5   Years of education: Not on file  Highest education level: Not on file  Occupational History   Not on file  Tobacco Use   Smoking status: Never   Smokeless tobacco: Never  Vaping Use   Vaping Use: Never used  Substance and Sexual Activity   Alcohol use: Not Currently   Drug use: No   Sexual activity: Not on file  Other Topics Concern   Not on file  Social History Narrative   Not on file   Social  Determinants of Health   Financial Resource Strain: Not on file  Food Insecurity: Not on file  Transportation Needs: Not on file  Physical Activity: Not on file  Stress: Not on file  Social Connections: Not on file  Intimate Partner Violence: Not on file    FAMILY HISTORY: Family History  Problem Relation Age of Onset   Skin cancer Mother    Stroke Father    Diabetes Brother    Brain cancer Brother    Diabetes Mellitus I Grandchild    Breast cancer Daughter 93    ALLERGIES:  is allergic to bee venom, iodinated contrast media, crestor [rosuvastatin], and lipitor [atorvastatin].  MEDICATIONS:  Current Outpatient Medications  Medication Sig Dispense Refill   blood glucose meter kit and supplies by Other route as directed. Contour next Meter. Dispense based on patient and insurance preference. Use up to four times daily as directed. (FOR ICD-10 E10.9, E11.9).     carvedilol (COREG) 6.25 MG tablet Take 1 tablet (6.25 mg total) by mouth 2 (two) times daily with a meal. 180 tablet 1   Cholecalciferol 25 MCG (1000 UT) tablet Take 2,000 Units by mouth daily.     Continuous Blood Gluc Receiver (FREESTYLE LIBRE 2 READER) DEVI 1 each by Does not apply route daily. 1 each 0   Continuous Blood Gluc Sensor (FREESTYLE LIBRE 2 SENSOR) MISC 1 each by Does not apply route every 14 (fourteen) days. 6 each 3   ferrous sulfate 325 (65 FE) MG tablet Take 325 mg by mouth daily with breakfast.     Glucagon 3 MG/DOSE POWD Place 3 mg into the nose once as needed for up to 1 dose. 1 each 11   insulin glargine (LANTUS SOLOSTAR) 100 UNIT/ML Solostar Pen Inject 14 Units into the skin daily. 15 mL 0   insulin lispro (HUMALOG KWIKPEN) 100 UNIT/ML KwikPen Inject 4-8 Units into the skin 3 (three) times daily. (Patient taking differently: Inject 2-6 Units into the skin 3 (three) times daily as needed (high blood sugar). Pt taking PRN with meals) 15 mL 3   Insulin Pen Needle 31G X 6 MM MISC Use 4 times daily 150 each  3   potassium chloride (KLOR-CON) 10 MEQ tablet Take 10 mEq by mouth daily.     Semaglutide,0.25 or 0.'5MG'$ /DOS, (OZEMPIC, 0.25 OR 0.5 MG/DOSE,) 2 MG/3ML SOPN INJECT 0.5 MG UNDER THE SKIN ONCE A WEEK (Patient taking differently: Inject 0.5 mg into the skin every Sunday.) 9 mL 1   Multiple Vitamins-Minerals (MULTIVITAMIN WITH MINERALS) tablet Take 1 tablet by mouth daily.     Multiple Vitamins-Minerals (OCUVITE ADULT 50+ PO) Take 1 tablet by mouth daily.     sacubitril-valsartan (ENTRESTO) 24-26 MG TAKE 1 TABLET BY MOUTH TWICE DAILY 60 tablet 4   No current facility-administered medications for this visit.    REVIEW OF SYSTEMS:   Constitutional: ( - ) fevers, ( - )  chills , ( - ) night sweats Eyes: ( - ) blurriness of vision, ( - ) double vision, ( - )  watery eyes Ears, nose, mouth, throat, and face: ( - ) mucositis, ( - ) sore throat Respiratory: ( - ) cough, ( - ) dyspnea, ( - ) wheezes Cardiovascular: ( - ) palpitation, ( - ) chest discomfort, ( - ) lower extremity swelling Gastrointestinal:  ( - ) nausea, ( - ) heartburn, ( - ) change in bowel habits Skin: ( - ) abnormal skin rashes Lymphatics: ( - ) new lymphadenopathy, ( - ) easy bruising Neurological: ( - ) numbness, ( - ) tingling, ( - ) new weaknesses Behavioral/Psych: ( - ) mood change, ( - ) new changes  All other systems were reviewed with the patient and are negative.  PHYSICAL EXAMINATION: ECOG PERFORMANCE STATUS: 0 - Asymptomatic  Vitals:   10/10/22 1138  BP: (!) 159/82  Pulse: 69  Resp: 15  Temp: 98.1 F (36.7 C)  SpO2: 98%   Filed Weights   10/10/22 1138  Weight: 133 lb 8 oz (60.6 kg)    GENERAL: well appearing female in NAD  SKIN: skin color, texture, turgor are normal, no rashes or significant lesions EYES: conjunctiva are pink and non-injected, sclera clear OROPHARYNX: no exudate, no erythema; lips, buccal mucosa, and tongue normal  NECK: supple, non-tender LYMPH:  no palpable lymphadenopathy in the  cervical or supraclavicular lymph nodes.  LUNGS: clear to auscultation and percussion with normal breathing effort HEART: regular rate & rhythm and no murmurs and no lower extremity edema ABDOMEN: soft, non-tender, non-distended, normal bowel sounds Musculoskeletal: no cyanosis of digits and no clubbing  PSYCH: alert & oriented x 3, fluent speech NEURO: no focal motor/sensory deficits  LABORATORY DATA:  I have reviewed the data as listed    Latest Ref Rng & Units 10/10/2022   12:33 PM 10/05/2022   11:58 AM 10/21/2021    5:54 PM  CBC  WBC 4.0 - 10.5 K/uL 6.7  7.4  9.1   Hemoglobin 12.0 - 15.0 g/dL 10.7  10.9  11.0   Hematocrit 36.0 - 46.0 % 31.8  33.1  32.7   Platelets 150 - 400 K/uL 184  196  161        Latest Ref Rng & Units 10/10/2022   12:33 PM 10/05/2022   11:58 AM 10/21/2021    5:54 PM  CMP  Glucose 70 - 99 mg/dL 125  116  127   BUN 8 - 23 mg/dL 24  24  47   Creatinine 0.44 - 1.00 mg/dL 1.57  1.85  2.47   Sodium 135 - 145 mmol/L 142  140  138   Potassium 3.5 - 5.1 mmol/L 3.7  3.7  3.1   Chloride 98 - 111 mmol/L 110  108  106   CO2 22 - 32 mmol/L '25  21  23   '$ Calcium 8.9 - 10.3 mg/dL 8.7  9.0  8.8   Total Protein 6.5 - 8.1 g/dL 6.1   7.2   Total Bilirubin 0.3 - 1.2 mg/dL 0.6   0.6   Alkaline Phos 38 - 126 U/L 49   70   AST 15 - 41 U/L 10   11   ALT 0 - 44 U/L 12   16      RADIOGRAPHIC STUDIES: I have personally reviewed the radiological images as listed and agreed with the findings in the report. VAS US CAROTID  Result Date: 10/06/2022 Carotid Arterial Duplex Study Patient Name:  PERCIE MOUNTFORD  Date of Exam:   10/05/2022 Medical Rec #: [id number]  Accession #:    [id number] Date of Birth: 01-30-1951        Patient Gender: F Patient Age:   22 years Exam Location:  [facility] Procedure:      VAS US CAROTID Referring Phys: Rogue Jury ARIDA --------------------------------------------------------------------------------  Indications:   Right carotid bruit [R09.89  (ICD-10-CM)]. Risk Factors:  Hypertension, hyperlipidemia, Diabetes. Other Factors: Chronic systolic heart failure, Shortness of breath. Performing Technologist: Luane School RDCS  Examination Guidelines: A complete evaluation includes B-mode imaging, spectral Doppler, color Doppler, and power Doppler as needed of all accessible portions of each vessel. Bilateral testing is considered an integral part of a complete examination. Limited examinations for reoccurring indications may be performed as noted.  Right Carotid Findings: +----------+--------+--------+--------+------------------+--------+           PSV cm/sEDV cm/sStenosisPlaque DescriptionComments +----------+--------+--------+--------+------------------+--------+ CCA Prox  62      16                                         +----------+--------+--------+--------+------------------+--------+ CCA Distal65      18                                         +----------+--------+--------+--------+------------------+--------+ ICA Prox  44      11                                         +----------+--------+--------+--------+------------------+--------+ ICA Mid   54      14                                         +----------+--------+--------+--------+------------------+--------+ ICA Distal115     37      1-39%   homogeneous       tortuous +----------+--------+--------+--------+------------------+--------+ ECA       55      8                                          +----------+--------+--------+--------+------------------+--------+ +----------+--------+-------+----------------+-------------------+           PSV cm/sEDV cmsDescribe        Arm Pressure (mmHG) +----------+--------+-------+----------------+-------------------+ [id number]             Multiphasic, [id number]                 +----------+--------+-------+----------------+-------------------+ +---------+--------+--+--------+--+---------+ VertebralPSV  cm/s70EDV cm/s14Antegrade +---------+--------+--+--------+--+---------+  Left Carotid Findings: +----------+--------+--------+--------+------------------+--------+           PSV cm/sEDV cm/sStenosisPlaque DescriptionComments +----------+--------+--------+--------+------------------+--------+ CCA Prox  65      12                                         +----------+--------+--------+--------+------------------+--------+ CCA Distal63      21                                         +----------+--------+--------+--------+------------------+--------+  ICA Prox  71      22                                         +----------+--------+--------+--------+------------------+--------+ ICA Mid   73      26                                         +----------+--------+--------+--------+------------------+--------+ ICA Distal122     39      1-39%   homogeneous       tortuous +----------+--------+--------+--------+------------------+--------+ ECA       80      12                                         +----------+--------+--------+--------+------------------+--------+ +----------+--------+--------+----------------+-------------------+           PSV cm/sEDV cm/sDescribe        Arm Pressure (mmHG) +----------+--------+--------+----------------+-------------------+ [id number]             Multiphasic, [id number]                 +----------+--------+--------+----------------+-------------------+ +---------+--------+--+--------+--+---------+ VertebralPSV cm/s79EDV cm/s17Antegrade +---------+--------+--+--------+--+---------+   Summary: Right Carotid: Velocities in the right ICA are consistent with a 1-39% stenosis. Left Carotid: Velocities in the left ICA are consistent with a 1-39% stenosis. Vertebrals:  Bilateral vertebral arteries demonstrate antegrade flow. Subclavians: Normal flow hemodynamics were seen in bilateral subclavian              arteries. *See table(s)  above for measurements and observations.  Electronically signed by Larae Grooms MD on 10/06/2022 at 10:40:34 PM.    Final    ECHOCARDIOGRAM COMPLETE  Result Date: 10/06/2022    ECHOCARDIOGRAM REPORT   Patient Name:   JAMERIAH CARSWELL Date of Exam: 10/05/2022 Medical Rec #:  [id number]       Height:       62.0 in Accession #:    [id number]      Weight:       137.1 lb Date of Birth:  07/17/1951       BSA:          1.628 m Patient Age:    78 years        BP:           145/74 mmHg Patient Gender: F               HR:           73 bpm. Exam Location:  [facility] Procedure: 2D Echo, Cardiac Doppler, Color Doppler and Strain Analysis Indications:    Chronic systolic heart failure (HCC) [I50.22 (ICD-10-CM)]  History:        Patient has prior history of Echocardiogram examinations, most                 recent 02/20/2017. Signs/Symptoms:Dyspnea; Risk                 Factors:Hypertension and Dyslipidemia.  Sonographer:    Luane School RDCS Referring Phys: [place]  1. Left ventricular ejection fraction, by estimation, is 45 to 50%. Left ventricular ejection fraction by PLAX is 48 %. The left ventricle has mildly decreased function. The left  ventricle demonstrates global hypokinesis. There is moderate left ventricular hypertrophy. Left ventricular diastolic parameters are consistent with Grade II diastolic dysfunction (pseudonormalization). The average left ventricular global longitudinal strain is -12.6 %. The global longitudinal strain is abnormal.  2. Right ventricular systolic function is normal. The right ventricular size is normal. Mildly increased right ventricular wall thickness. There is normal pulmonary artery systolic pressure. The estimated right ventricular systolic pressure is 123456 mmHg.  3. Left atrial size was mildly dilated.  4. The mitral valve is normal in structure. Mild mitral valve regurgitation. No evidence of mitral stenosis.  5. Tricuspid valve regurgitation is mild to  moderate.  6. The aortic valve has an indeterminant number of cusps. Aortic valve regurgitation is not visualized. No aortic stenosis is present.  7. The inferior vena cava is normal in size with greater than 50% respiratory variability, suggesting right atrial pressure of 3 mmHg. FINDINGS  Left Ventricle: Left ventricular ejection fraction, by estimation, is 45 to 50%. Left ventricular ejection fraction by PLAX is 48 %. The left ventricle has mildly decreased function. The left ventricle demonstrates global hypokinesis. The average left ventricular global longitudinal strain is -12.6 %. The global longitudinal strain is abnormal. The left ventricular internal cavity size was normal in size. There is moderate left ventricular hypertrophy. Left ventricular diastolic parameters are consistent with Grade II diastolic dysfunction (pseudonormalization). Right Ventricle: The right ventricular size is normal. Mildly increased right ventricular wall thickness. Right ventricular systolic function is normal. There is normal pulmonary artery systolic pressure. The tricuspid regurgitant velocity is 2.74 m/s, and with an assumed right atrial pressure of 3 mmHg, the estimated right ventricular systolic pressure is 123456 mmHg. Left Atrium: Left atrial size was mildly dilated. Right Atrium: Right atrial size was normal in size. Pericardium: There is no evidence of pericardial effusion. Mitral Valve: The mitral valve is normal in structure. Mild mitral annular calcification. Mild mitral valve regurgitation. No evidence of mitral valve stenosis. Tricuspid Valve: The tricuspid valve is normal in structure. Tricuspid valve regurgitation is mild to moderate. No evidence of tricuspid stenosis. Aortic Valve: The aortic valve has an indeterminant number of cusps. Aortic valve regurgitation is not visualized. No aortic stenosis is present. Pulmonic Valve: The pulmonic valve was normal in structure. Pulmonic valve regurgitation is not  visualized. No evidence of pulmonic stenosis. Aorta: The aortic root is normal in size and structure. Venous: The inferior vena cava is normal in size with greater than 50% respiratory variability, suggesting right atrial pressure of 3 mmHg. IAS/Shunts: No atrial level shunt detected by color flow Doppler.  LEFT VENTRICLE PLAX 2D LV EF:         Left            Diastology                ventricular     LV e' medial:    7.72 cm/s                ejection        LV E/e' medial:  17.2                fraction by     LV e' lateral:   7.62 cm/s                PLAX is 48      LV E/e' lateral: 17.5                %. LVIDd:  5.00 cm         2D LVIDs:         3.80 cm         Longitudinal LV PW:         1.40 cm         Strain LV IVS:        1.50 cm         2D Strain GLS  -12.6 % LVOT diam:     2.00 cm         Avg: LV SV:         67 LV SV Index:   41 LVOT Area:     3.14 cm  LV Volumes (MOD) LV vol d, MOD    73.0 ml A2C: LV vol d, MOD    81.1 ml A4C: LV vol s, MOD    33.2 ml A2C: LV vol s, MOD    43.5 ml A4C: LV SV MOD A2C:   39.8 ml LV SV MOD A4C:   81.1 ml LV SV MOD BP:    39.0 ml RIGHT VENTRICLE             IVC RV S prime:     12.00 cm/s  IVC diam: 1.10 cm TAPSE (M-mode): 2.1 cm LEFT ATRIUM             Index        RIGHT ATRIUM           Index LA diam:        4.50 cm 2.76 cm/m   RA Area:     12.00 cm LA Vol (A2C):   59.3 ml 36.42 ml/m  RA Volume:   29.50 ml  18.12 ml/m LA Vol (A4C):   60.1 ml 36.91 ml/m LA Biplane Vol: 61.0 ml 37.46 ml/m  AORTIC VALVE LVOT Vmax:   93.80 cm/s LVOT Vmean:  67.800 cm/s LVOT VTI:    0.214 m  AORTA Ao Root diam: 2.80 cm Ao Asc diam:  3.40 cm Ao Desc diam: 2.00 cm MITRAL VALVE                TRICUSPID VALVE MV Area (PHT): 6.43 cm     TR Peak grad:   30.0 mmHg MV Decel Time: 118 msec     TR Vmax:        274.00 cm/s MV E velocity: 133.00 cm/s                             SHUNTS                             Systemic VTI:  0.21 m                             Systemic Diam: 2.00 cm Ida Rogue MD Electronically signed by Ida Rogue MD Signature Date/Time: 10/06/2022/12:02:59 PM    Final     ASSESSMENT & PLAN Jaszmine Linhart is a 72 y.o. female who presents to the diagnostic clinic for evaluation of pancreatic mass. We reviewed the most recent MRI images in detailed that showed the increasing cystic lesion in the pancreas. We discussed possible etiologies including benign cystic mass versus malignancy. Recommend laboratory evaluation today followed by evaluation for EUS with FNA of pancreatic mass. Patient agreed with the plan and is willing  to move forward with the workup.   #Enlarging cystic pancreatic mass: --Labs today to check CBC, CMP, CEA and CA19-9 --Need EUS with biopsy for tissue diagnosis. Sent urgent message to Dr. Rush Landmark.  --RTC 2-3 days after biopsy to review diagnosis.   Orders Placed This Encounter  Procedures   CBC with Differential ([facility] Only)    Standing Status:   Future    Number of Occurrences:   1    Standing Expiration Date:   10/11/2023   CMP ([facility] only)    Standing Status:   Future    Number of Occurrences:   1    Standing Expiration Date:   10/11/2023   CEA ([facility])-[facility] ONLY    Standing Status:   Future    Number of Occurrences:   1    Standing Expiration Date:   10/11/2023   CA 19.9    Standing Status:   Future    Number of Occurrences:   1    Standing Expiration Date:   10/10/2023   Ambulatory referral to Gastroenterology    Referral Priority:   Urgent    Referral Type:   Consultation    Referral Reason:   Specialty Services Required    Number of Visits Requested:   1    All questions were answered. The patient knows to call the clinic with any problems, questions or concerns.  I have spent a total of 60 minutes minutes of face-to-face and non-face-to-face time, preparing to see the patient, obtaining and/or reviewing separately obtained history, performing a medically appropriate examination, counseling and  educating the patient, ordering tests/procedures, referring and communicating with other health care professionals, documenting clinical information in the electronic health record, and care coordination.   Dede Query, PA-C Department of Hematology/Oncology [place] at [facility] Phone: [phone]  Patient was seen with Dr. Lorenso Courier  I have read the above note and personally examined the patient. I agree with the assessment and plan as noted above.  Briefly Mrs. Sisak is a 72 year old female who presents for evaluation of a pancreatic mass.  Most recently the patient underwent an MRI of the head of the pancreas on 08/16/2022 which showed enlargement of multilocular cystic lesion in the pancreatic head and uncinate process.  Due to concern for these findings she was referred to oncology for further evaluation and management.  At this time is unclear if this lesion represents a malignancy.  We made a referral to Dr. Rush Landmark in gastroenterology for consideration of an upper EUS to evaluate and possibly biopsy.  Additionally we will order tumor markers with CEA, CA 19-9.  The patient voiced understanding of our findings and plans moving forward.   Ledell Peoples, MD Department of Hematology/Oncology [place] at [facility] Phone: [phone] Pager: [phone] Email: [email or web address]

## 2022-10-11 ENCOUNTER — Other Ambulatory Visit: Payer: Self-pay

## 2022-10-11 DIAGNOSIS — K8689 Other specified diseases of pancreas: Secondary | ICD-10-CM

## 2022-10-11 NOTE — Telephone Encounter (Signed)
Mansouraty, Telford Nab., MD  Cordelia Poche; Chassell, Marthenia Rolling, RN; Orson Slick, MD ITT and JTD, I looked at the imaging, and I do not have as much of a concern that this is overtly a malignant lesion but since you all have seen her and there has been a significant enlargement, I do agree that an endoscopic ultrasound makes sense.  I am going to try to get her scheduled for a few weeks from now in the slot that is being made.  Separate telephone note will be in the chart. Thanks. GM  Stepheny Canal, Look for a separate telephone note that was sent to me.  It will be sent back to you.       Previous Messages    ----- Message ----- From: Cordelia Poche Sent: 10/10/2022  12:27 PM EST To: Irving Copas., MD; * Subject: EUS request                                    Dr. Rush Landmark,  Dr. Lorenso Courier and I saw Ms. Lanahan today to enlarging pancreatic mass that needs tissue biopsy to rule out malignancy. Can you review the MRI images and see if you can get her in for endoscopic ultrasound?  Thanks, Murray Hodgkins

## 2022-10-11 NOTE — Telephone Encounter (Signed)
Patty, Please schedule this patient for upper EUS.  Please use my hospital morning at [facility] long on 3/14 to follow the other case. We will evaluate the pancreatic cystic lesion that has enlarged and ensure that there is no evidence of a malignancy and proceed with aspiration sampling if safe. Please let PA Thayil and Dr. Lorenso Courier know when the patient has been scheduled. Thanks. GM

## 2022-10-11 NOTE — Telephone Encounter (Signed)
EUS has been scheduled for 10/25/22 at 830 am at [facility] with GM   Pt on Ozempic for DM will hold for 7 days prior   EUS scheduled, pt instructed and medications reviewed.  Patient instructions mailed to home.  Patient to call with any questions or concerns.

## 2022-10-12 LAB — CANCER ANTIGEN 19-9: CA 19-9: 11 U/mL (ref 0–35)

## 2022-10-18 ENCOUNTER — Encounter (HOSPITAL_COMMUNITY): Payer: Self-pay | Admitting: Gastroenterology

## 2022-10-20 ENCOUNTER — Other Ambulatory Visit: Payer: Self-pay | Admitting: Cardiovascular Disease

## 2022-10-20 DIAGNOSIS — I5022 Chronic systolic (congestive) heart failure: Secondary | ICD-10-CM

## 2022-10-23 ENCOUNTER — Ambulatory Visit (INDEPENDENT_AMBULATORY_CARE_PROVIDER_SITE_OTHER): Payer: 59 | Admitting: Internal Medicine

## 2022-10-23 ENCOUNTER — Encounter: Payer: Self-pay | Admitting: Internal Medicine

## 2022-10-23 VITALS — BP 158/82 | HR 69 | Ht 62.0 in | Wt 135.8 lb

## 2022-10-23 DIAGNOSIS — E785 Hyperlipidemia, unspecified: Secondary | ICD-10-CM

## 2022-10-23 DIAGNOSIS — E1159 Type 2 diabetes mellitus with other circulatory complications: Secondary | ICD-10-CM | POA: Diagnosis not present

## 2022-10-23 DIAGNOSIS — E1165 Type 2 diabetes mellitus with hyperglycemia: Secondary | ICD-10-CM | POA: Diagnosis not present

## 2022-10-23 LAB — LIPID PANEL
Cholesterol: 251 mg/dL — ABNORMAL HIGH (ref 0–200)
HDL: 68 mg/dL (ref >39.00)
LDL Cholesterol: 159 mg/dL — ABNORMAL HIGH (ref 0–99)
NonHDL: 182.81
Total CHOL/HDL Ratio: 4
Triglycerides: 117 mg/dL (ref 0.0–149.0)
VLDL: 23.4 mg/dL (ref 0.0–40.0)

## 2022-10-23 LAB — POCT GLYCOSYLATED HEMOGLOBIN (HGB A1C): Hemoglobin A1C: 6.8 % — AB (ref 4.0–5.6)

## 2022-10-23 NOTE — Progress Notes (Signed)
Patient ID: Bethany Gillespie, female   DOB: [birth year], 72 y.o.   MRN: [id number]   HPI: Bethany Gillespie is a 72 y.o.-year-old female, initially referred by her previous endocrinologist, Dr. Gabriel Carina, returning for follow-up for DM2, dx in 2000s, insulin-dependent  - started several years later, uncontrolled, with complications (CAD, nonischemic CHF, diabetic retinopathy, peripheral neuropathy, CKD stage IV with microalbuminuria).  She was previously seeing Dr. Gabriel Carina but cannot drive to her office anymore due to her worsening vision she had to establish care with an endocrinologist in town.  Our last visit was 5 months ago.  Interim hx: No increased urination, nausea, chest pain. She has blurry vision (macular degeneration). She cannot drive.   She was recently found to have an enlarging pancreatic mass, suspicious of neoplasm.  She has EUS scheduled in 2 days. She had the flu 09/2022 >> for 3 weeks >> sugars higher.  Reviewed HbA1c levels: Lab Results  Component Value Date   HGBA1C 6.7 (A) 06/01/2022   HGBA1C 7.1 (A) 12/15/2021   HGBA1C 6.8 (A) 08/17/2021   HGBA1C 9.7 (A) 05/16/2021   HGBA1C 9.4 (A) 02/10/2021   HGBA1C 8.9 (A) 11/03/2020   HGBA1C 12.9 (H) 02/28/2016   HGBA1C 12.0 (H) 02/27/2016   HGBA1C 9.7 (H) 02/25/2014  12/2019: HbA1c 8.9%  Pt is on a regimen of: - Ozempic 0.25 >> 0.5 mg weekly - added 02/2021- out for 3 weeks at last visit >> restarted (now off for 2 weeks per GI) - Lantus 40 >> 35 >> 25 >> 20 >> 17 >> 14 units at bedtime - Humalog 12 >> 8-12 >> 4-8 units before meal >> 4-8 >> 2-4 >> 1-2 >> increased this by herself to 2-6 units for correction only Prev. On Metformin and Actos.  Pt checks her sugars more than 4 times a day with her freestyle libre CGM:  Prev.:   Prev.   Lowest sugar was LO >> 30s; she has hypoglycemia awareness at 60s.  Highest sugar was 300 (gatorade) >> 200s.  Pt's meals are: - Breakfast: oatmeal + berries, yoghurt - Lunch: salad or cheese  + crackers, soup - Dinner: meat + veggies or starch - Snacks: 3: nuts, popcorn  - + CKD with microalbuminuria - Dr. Candiss Norse, last BUN/creatinine:  Lab Results  Component Value Date   BUN 24 (H) 10/10/2022   BUN 24 (H) 10/05/2022   CREATININE 1.57 (H) 10/10/2022   CREATININE 1.85 (H) 10/05/2022  05/16/2019: 39/217, GFR 23, glucose 161, protein to creatinine ratio 0.156 (0.024-0.184) 09/14/2020: 20/1.9, GFR 26, glucose 174 05/24/2020: 25/2.1 glucose 118 She is on Entresto twice a day.  - + HL; last set of lipids: 08/2021: In PCPs office-I do not have these results 09/14/2020: 166/145/61.4/76 Lab Results  Component Value Date   CHOL 164 07/13/2020   HDL 61 07/13/2020   LDLCALC 76 07/13/2020   TRIG 133 07/13/2020   CHOLHDL 2.7 07/13/2020  She was on Crestor 10 mg daily >> off 2/2 leg cramps.  Managed by cardiology (Dr. Fletcher Anon).   - last eye exam 10/22/2022.  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 + IO injections.  - resolved numbness and tingling in her feet.  She is planning to establish care with podiatry-she has nail psoriasis.   Pt has FH of DM in brother and grandson (DM1).  Latest TSH was normal, at 1.074 on 08/29/2018. No results found for: "TSH" She is on iron sulfate for anemia. 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 She saw nephrology for a renal cyst.  No h/o pancreatitis. No FH of MTC.  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 s/p Engineer, agricultural PPM in 03/2015   Bell's palsy    Bradycardia    CAD (coronary artery disease)    a. 02/2016 Cath: LM nl, LAD 30p, D1 small w/ severe ostial/mid dzs, LCX nl, RCA nl, EF <25%, glob HK.   Chronic combined systolic and diastolic CHF (congestive heart failure) ([place])    a. Echo in 03/2015: EF 55-60%, Grade 1 DD;  b. 02/2016 Echo: 25-30%, mod LVH, Gr1 DD,  mild MR, mildly dil LA, prominent apical trabeculations.   Diabetes ([place])    Hernia of abdominal wall    HTN (hypertension)    Hypertension    NICM (nonischemic cardiomyopathy) ([place])    a. 02/2016 relatively non-obstructive cath, EF 25-30% by echo.   Past Surgical History:  Procedure Laterality Date   CARDIAC CATHETERIZATION Bilateral 03/01/2016   Procedure: Left Heart Cath and Coronary Angiography;  Surgeon: Minna Merritts, MD;  Location: [place] [facility];  Service: Cardiovascular;  Laterality: Bilateral;   COLONOSCOPY WITH PROPOFOL N/A 05/06/2019   Procedure: COLONOSCOPY WITH PROPOFOL;  Surgeon: Toledo, Benay Pike, MD;  Location: [facility] END[facility];  Service: Gastroenterology;  Laterality: N/A;   ESOPHAGOGASTRODUODENOSCOPY (EGD) WITH PROPOFOL N/A 05/06/2019   Procedure: ESOPHAGOGASTRODUODENOSCOPY (EGD) WITH PROPOFOL;  Surgeon: Toledo, Benay Pike, MD;  Location: [facility] ENDOS[facility];  Service: Gastroenterology;  Laterality: N/A;   HERNIA REPAIR     INSERT / REPLACE / REMOVE PACEMAKER     PACEMAKER INSERTION     PACEMAKER INSERTION     Social History   Socioeconomic History   Marital status: Widowed    Spouse name: Not on file   Number of children: 5   Years of education: Not on file   Highest education level: Not on file  Occupational History   Not on file  Tobacco Use   Smoking status: Never   Smokeless tobacco: Never  Vaping Use   Vaping Use: Never used  Substance and Sexual Activity   Alcohol use: Not Currently   Drug use: No   Sexual activity: Not on file  Other Topics Concern   Not on file  Social History Narrative   Not on file   Social Determinants of Health   Financial Resource Strain: Not on file  Food Insecurity: Not on file  Transportation Needs: Not on file  Physical Activity: Not on file  Stress: Not on file  Social Connections: Not on file  Intimate Partner Violence: Not on file   Current Outpatient Medications on File Prior to Visit  Medication Sig  Dispense Refill   blood glucose meter kit and supplies by Other route as directed. Contour next Meter. Dispense based on patient and insurance preference. Use up to four times daily as directed. (FOR ICD-10 E10.9, E11.9).     carvedilol (COREG) 6.25 MG tablet Take 1 tablet (6.25 mg total) by mouth 2 (two) times daily with a meal. 180 tablet 1   Cholecalciferol 25 MCG (1000 UT) tablet Take 2,000 Units by mouth daily.     Continuous Blood Gluc Receiver (FREESTYLE LIBRE 2 READER) DEVI 1 each by Does not apply route daily. 1 each 0   Continuous Blood Gluc Sensor (FREESTYLE LIBRE 2 SENSOR) MISC 1  each by Does not apply route every 14 (fourteen) days. 6 each 3   Diclofenac Sodium CR 100 MG 24 hr tablet Take 1 tablet (100 mg total) by mouth daily. 10 tablet 0   ferrous sulfate 325 (65 FE) MG tablet Take 325 mg by mouth daily with breakfast.     Glucagon 3 MG/DOSE POWD Place 3 mg into the nose once as needed for up to 1 dose. 1 each 11   insulin glargine (LANTUS SOLOSTAR) 100 UNIT/ML Solostar Pen Inject 14 Units into the skin daily. 15 mL 0   insulin lispro (HUMALOG KWIKPEN) 100 UNIT/ML KwikPen Inject 4-8 Units into the skin 3 (three) times daily. (Patient taking differently: Inject 2-6 Units into the skin 3 (three) times daily. Pt taking PRN with meals) 15 mL 3   Insulin Pen Needle 31G X 6 MM MISC Use 4 times daily 150 each 3   potassium chloride (KLOR-CON) 10 MEQ tablet Take 10 mEq by mouth daily.     sacubitril-valsartan (ENTRESTO) 24-26 MG TAKE 1 TABLET BY MOUTH TWICE DAILY 60 tablet 4   Semaglutide,0.25 or 0.'5MG'$ /DOS, (OZEMPIC, 0.25 OR 0.5 MG/DOSE,) 2 MG/3ML SOPN INJECT 0.5 MG UNDER THE SKIN ONCE A WEEK 9 mL 1   No current facility-administered medications on file prior to visit.   Allergies  Allergen Reactions   Bee Venom Anaphylaxis   Atorvastatin Other (See Comments)    Severe leg cramping   Crestor [Rosuvastatin] Other (See Comments)    Leg cramps   Iodinated Contrast Media    Family  History  Problem Relation Age of Onset   Skin cancer Mother    Stroke Father    Diabetes Brother    Brain cancer Brother    Diabetes Mellitus I Grandchild    Breast cancer Daughter 16   PE: There were no vitals taken for this visit. Wt Readings from Last 3 Encounters:  10/10/22 133 lb 8 oz (60.6 kg)  08/24/22 137 lb 2 oz (62.2 kg)  06/01/22 142 lb 6.4 oz (64.6 kg)   Constitutional: normal weight, in NAD Eyes:  EOMI, no exophthalmos ENT: no neck masses, no cervical lymphadenopathy Cardiovascular: RRR, No MRG Respiratory: CTA B Musculoskeletal: no deformities Skin:no rashes Neurological: no tremor with outstretched hands  ASSESSMENT: 1. DM2, insulin-dependent, uncontrolled, with long-term complications - CAD - nonischemic CHF - diabetic retinopathy - peripheral neuropathy - CKD stage IV with microalbuminuria  No family history of medullary thyroid cancer or personal history of pancreatitis.  2. HL  PLAN:  1. Patient with longstanding, uncontrolled, type 2 diabetes, on basal insulin and GLP-1 receptor agonist, and previously on mealtime insulin, now only on correction rapid acting insulin.  At last visit, sugars were dropping very low overnight, frequently on the 70s and they were increasing after every meal, but mostly within the target range with only occasional higher blood sugars, at 200.  Sugars after dinner were dropping fairly abruptly.  I advised her to reduce the dose of Lantus to and to take a lower dose of Humalog only for correction.  -She has a glucagon kit at home CGM interpretation: -At today's visit, we reviewed her CGM downloads: It appears that 81% of values are in target range (goal >70%), while 17% are higher than 180 (goal <25%), and 2% are lower than 70 (goal <4%).  The calculated average blood sugar is 136.  The projected HbA1c for the next 3 months (GMI) is 6.6%. -Reviewing the CGM trends, sugars are still dropping overnight but  not as low as before.   She occasionally still drops under 70s, and sugars are more fluctuating during the day, increasing after meals and then dropping abruptly.  For now, we will try to limit her lows.  I advised her to move Lantus to the morning to use only 2 to 4 units of Humalog for correction. The high blood sugars possibly related to her being sick and it is likely that they will improve after she gets better. - I suggested to:  Patient Instructions  Please continue: - Ozempic 0.5 mg weekly  Change: - Lantus 14 units to am - Humalog for correction: 2-4 units   Please return in 3-4 months.   - we checked her HbA1c: 6.8% (only slightly higher) - advised to check sugars at different times of the day - 4x a day, rotating check times - advised for yearly eye exams >> she is UTD - return to clinic in 3-4 months  2. HL -Reviewed the latest available lipid panel from 12/21: LDL above target, OTW fractions at goal: Lab Results  Component Value Date   CHOL 164 07/13/2020   HDL 61 07/13/2020   LDLCALC 76 07/13/2020   TRIG 133 07/13/2020   CHOLHDL 2.7 07/13/2020  -She was on Crestor 10 mg daily but stopped due to leg cramps -Will check another lipid panel today  Component     Latest Ref Rng 10/23/2022  Cholesterol     0 - 200 mg/dL 251 (H)   Triglycerides     0.0 - 149.0 mg/dL 117.0   HDL Cholesterol     >39.00 mg/dL 68.00   Total CHOL/HDL Ratio 4   VLDL     0.0 - 40.0 mg/dL 23.4   LDL (calc)     0 - 99 mg/dL 159 (H)   Hemoglobin A1C     4.0 - 5.6 % 6.8 !   NonHDL 182.81   LDL is much worse after coming off of Crestor.  I will suggest another statin, maybe pravastatin, which is less likely to cause muscle cramps.  Will check with the patient.  Philemon Kingdom, MD PhD [facility] Endocrinology

## 2022-10-23 NOTE — Patient Instructions (Addendum)
Please continue: - Ozempic 0.5 mg weekly  Change: - Lantus 14 units to am - Humalog for correction: 2-4 units   Please return in 3-4 months.

## 2022-10-25 ENCOUNTER — Encounter (HOSPITAL_COMMUNITY): Payer: Self-pay | Admitting: Gastroenterology

## 2022-10-25 ENCOUNTER — Ambulatory Visit (HOSPITAL_COMMUNITY)
Admission: RE | Admit: 2022-10-25 | Discharge: 2022-10-25 | Disposition: A | Discharge status: Home / Self Care | Payer: 59 | Attending: Gastroenterology | Admitting: Gastroenterology

## 2022-10-25 ENCOUNTER — Encounter (HOSPITAL_COMMUNITY)
Admission: RE | Disposition: A | Discharge status: Home / Self Care | Payer: Self-pay | Source: Home / Self Care | Attending: Gastroenterology

## 2022-10-25 ENCOUNTER — Ambulatory Visit (HOSPITAL_COMMUNITY): Payer: 59 | Admitting: Registered Nurse

## 2022-10-25 ENCOUNTER — Other Ambulatory Visit: Payer: Self-pay

## 2022-10-25 ENCOUNTER — Ambulatory Visit (HOSPITAL_BASED_OUTPATIENT_CLINIC_OR_DEPARTMENT_OTHER): Payer: 59 | Admitting: Registered Nurse

## 2022-10-25 DIAGNOSIS — K3189 Other diseases of stomach and duodenum: Secondary | ICD-10-CM | POA: Diagnosis not present

## 2022-10-25 DIAGNOSIS — K2091 Esophagitis, unspecified with bleeding: Secondary | ICD-10-CM

## 2022-10-25 DIAGNOSIS — I899 Noninfective disorder of lymphatic vessels and lymph nodes, unspecified: Secondary | ICD-10-CM

## 2022-10-25 DIAGNOSIS — K449 Diaphragmatic hernia without obstruction or gangrene: Secondary | ICD-10-CM

## 2022-10-25 DIAGNOSIS — K2950 Unspecified chronic gastritis without bleeding: Secondary | ICD-10-CM | POA: Diagnosis not present

## 2022-10-25 DIAGNOSIS — Z794 Long term (current) use of insulin: Secondary | ICD-10-CM | POA: Insufficient documentation

## 2022-10-25 DIAGNOSIS — K2289 Other specified disease of esophagus: Secondary | ICD-10-CM

## 2022-10-25 DIAGNOSIS — K869 Disease of pancreas, unspecified: Secondary | ICD-10-CM

## 2022-10-25 DIAGNOSIS — E119 Type 2 diabetes mellitus without complications: Secondary | ICD-10-CM | POA: Diagnosis not present

## 2022-10-25 DIAGNOSIS — I517 Cardiomegaly: Secondary | ICD-10-CM | POA: Diagnosis not present

## 2022-10-25 DIAGNOSIS — K209 Esophagitis, unspecified without bleeding: Secondary | ICD-10-CM | POA: Insufficient documentation

## 2022-10-25 DIAGNOSIS — I110 Hypertensive heart disease with heart failure: Secondary | ICD-10-CM | POA: Insufficient documentation

## 2022-10-25 DIAGNOSIS — K862 Cyst of pancreas: Secondary | ICD-10-CM

## 2022-10-25 DIAGNOSIS — I081 Rheumatic disorders of both mitral and tricuspid valves: Secondary | ICD-10-CM | POA: Insufficient documentation

## 2022-10-25 DIAGNOSIS — I2510 Atherosclerotic heart disease of native coronary artery without angina pectoris: Secondary | ICD-10-CM | POA: Insufficient documentation

## 2022-10-25 DIAGNOSIS — I5042 Chronic combined systolic (congestive) and diastolic (congestive) heart failure: Secondary | ICD-10-CM | POA: Insufficient documentation

## 2022-10-25 DIAGNOSIS — N289 Disorder of kidney and ureter, unspecified: Secondary | ICD-10-CM

## 2022-10-25 DIAGNOSIS — K8689 Other specified diseases of pancreas: Secondary | ICD-10-CM

## 2022-10-25 DIAGNOSIS — Z950 Presence of cardiac pacemaker: Secondary | ICD-10-CM | POA: Insufficient documentation

## 2022-10-25 DIAGNOSIS — E1165 Type 2 diabetes mellitus with hyperglycemia: Secondary | ICD-10-CM

## 2022-10-25 HISTORY — PX: EUS: SHX5427

## 2022-10-25 HISTORY — PX: ESOPHAGOGASTRODUODENOSCOPY (EGD) WITH PROPOFOL: SHX5813

## 2022-10-25 HISTORY — PX: FINE NEEDLE ASPIRATION: SHX5430

## 2022-10-25 HISTORY — PX: HEMOSTASIS CLIP PLACEMENT: SHX6857

## 2022-10-25 HISTORY — PX: BIOPSY: SHX5522

## 2022-10-25 LAB — GLUCOSE, CAPILLARY: Glucose-Capillary: 124 mg/dL — ABNORMAL HIGH (ref 70–99)

## 2022-10-25 SURGERY — UPPER ENDOSCOPIC ULTRASOUND (EUS) RADIAL
Anesthesia: Monitor Anesthesia Care

## 2022-10-25 MED ORDER — LIDOCAINE 2% (20 MG/ML) 5 ML SYRINGE
INTRAMUSCULAR | Status: DC | PRN
  Administered 2022-10-25: 60 mg via INTRAVENOUS

## 2022-10-25 MED ORDER — CIPROFLOXACIN IN D5W 400 MG/200ML IV SOLN
INTRAVENOUS | Status: AC
  Filled 2022-10-25: qty 200

## 2022-10-25 MED ORDER — PROPOFOL 1000 MG/100ML IV EMUL
INTRAVENOUS | Status: AC
  Filled 2022-10-25: qty 100

## 2022-10-25 MED ORDER — LABETALOL HCL 5 MG/ML IV SOLN
INTRAVENOUS | Status: AC
  Filled 2022-10-25: qty 4

## 2022-10-25 MED ORDER — SUCRALFATE 1 GM/10ML PO SUSP: 1.0000 g | Freq: Four times a day (QID) | ORAL | 6 refills | Status: DC

## 2022-10-25 MED ORDER — INSULIN LISPRO (1 UNIT DIAL) 100 UNIT/ML (KWIKPEN): 4.0000 [IU] | PEN_INJECTOR | Freq: Three times a day (TID) | SUBCUTANEOUS | 3 refills | Status: DC

## 2022-10-25 MED ORDER — PROPOFOL 10 MG/ML IV BOLUS
INTRAVENOUS | Status: DC | PRN
  Administered 2022-10-25 (×3): 10 mg via INTRAVENOUS

## 2022-10-25 MED ORDER — PROPOFOL 500 MG/50ML IV EMUL
INTRAVENOUS | Status: DC | PRN
  Administered 2022-10-25: 140 ug/kg/min via INTRAVENOUS

## 2022-10-25 MED ORDER — LABETALOL HCL 5 MG/ML IV SOLN
10.0000 mg | Freq: Once | INTRAVENOUS | Status: AC
  Administered 2022-10-25: 10 mg via INTRAVENOUS

## 2022-10-25 MED ORDER — CIPROFLOXACIN HCL 500 MG PO TABS: 500.0000 mg | ORAL_TABLET | Freq: Two times a day (BID) | ORAL | 0 refills | Status: AC

## 2022-10-25 MED ORDER — OMEPRAZOLE 40 MG PO CPDR: 40.0000 mg | DELAYED_RELEASE_CAPSULE | Freq: Two times a day (BID) | ORAL | 6 refills | Status: DC

## 2022-10-25 MED ORDER — SODIUM CHLORIDE 0.9 % IV SOLN: INTRAVENOUS | Status: DC

## 2022-10-25 MED ORDER — CIPROFLOXACIN IN D5W 400 MG/200ML IV SOLN
INTRAVENOUS | Status: DC | PRN
  Administered 2022-10-25: 400 mg via INTRAVENOUS

## 2022-10-25 MED ORDER — PROPOFOL 10 MG/ML IV BOLUS
INTRAVENOUS | Status: AC
  Filled 2022-10-25: qty 20

## 2022-10-25 MED ORDER — CIPROFLOXACIN HCL 500 MG PO TABS: 500.0000 mg | ORAL_TABLET | Freq: Two times a day (BID) | ORAL | 0 refills | Status: DC

## 2022-10-25 SURGICAL SUPPLY — 15 items

## 2022-10-25 NOTE — Transfer of Care (Signed)
Immediate Anesthesia Transfer of Care Note  Patient: Bethany Gillespie  Procedure(s) Performed: UPPER ENDOSCOPIC ULTRASOUND (EUS) RADIAL ESOPHAGOGASTRODUODENOSCOPY (EGD) WITH PROPOFOL BIOPSY FINE NEEDLE ASPIRATION (FNA) LINEAR HEMOSTASIS CLIP PLACEMENT  Patient Location: PACU and Endoscopy Unit  Anesthesia Type:MAC  Level of Consciousness: awake, alert , oriented, and patient cooperative  Airway & Oxygen Therapy: Patient Spontanous Breathing and Patient connected to face mask oxygen  Post-op Assessment: Report given to RN, Post -op Vital signs reviewed and stable, and Patient moving all extremities  Post vital signs: Reviewed and stable  Last Vitals:  Vitals Value Taken Time  BP    Temp    Pulse 64 10/25/22 1029  Resp 19 10/25/22 1029  SpO2 100 % 10/25/22 1029  Vitals shown include unvalidated device data.  Last Pain:  Vitals:   10/25/22 0739  TempSrc: Temporal  PainSc: 0-No pain         Complications: No notable events documented.

## 2022-10-25 NOTE — Op Note (Signed)
[facility] Patient Name: Bethany Gillespie Procedure Date: 10/25/2022 MRN: [id number] Attending MD: Justice Britain , MD, [id number] Date of Birth: Dec 21, 1950 CSN: [id number] Age: 72 Admit Type: Outpatient Procedure:                Upper EUS Indications:              Pancreatic cyst on CT scan, Pancreatic cyst on MRCP Providers:                Justice Britain, MD, Jeanella Cara, RN,                            William Dalton, Technician Referring MD:             Ledell Peoples Iv, Arthur Holms, Benay Pike Toledo                            MD, MD Medicines:                Monitored Anesthesia Care Complications:            No immediate complications. Estimated Blood Loss:     Estimated blood loss was minimal. Procedure:                Pre-Anesthesia Assessment:                           - Prior to the procedure, a History and Physical                            was performed, and patient medications and                            allergies were reviewed. The patient's tolerance of                            previous anesthesia was also reviewed. The risks                            and benefits of the procedure and the sedation                            options and risks were discussed with the patient.                            All questions were answered, and informed consent                            was obtained. Prior Anticoagulants: The patient has                            taken no anticoagulant or antiplatelet agents. ASA                            Grade Assessment: III - A patient with severe  systemic disease. After reviewing the risks and                            benefits, the patient was deemed in satisfactory                            condition to undergo the procedure.                           After obtaining informed consent, the endoscope was                            passed under direct vision. Throughout  the                            procedure, the patient's blood pressure, pulse, and                            oxygen saturations were monitored continuously. The                            [id number]-[id number]) Olympus endoscope was introduced                            through the mouth, and advanced to the second part                            of duodenum. The [id number]-[id number] Olympus                            duodenoscope was introduced through the mouth, and                            advanced to the area of papilla. The [id number]                            [id number]) Olympus linear ultrasound scope was                            introduced through the mouth, and advanced to the                            duodenum for ultrasound examination from the                            stomach and duodenum. The upper EUS was                            accomplished without difficulty. The patient                            tolerated the procedure. Scope In: Scope Out: Findings:      ENDOSCOPIC FINDING: :      No gross lesions were noted in the  proximal esophagus and in the mid       esophagus.      Red blood from recent oozing was found in the distal esophagus.       Suctioned via endoscope and lavaged the area.      After the lavage, LA Grade C (one or more mucosal breaks continuous       between tops of 2 or more mucosal folds, less than 75% circumference)       esophagitis with bleeding was found in the distal esophagus. Biopsies       were taken with a cold forceps for histology. For hemostasis, one       hemostatic clip was successfully placed (MR conditional). Clip       manufacturer: [employer]. There was no bleeding at the end of the       procedure.      The Z-line was irregular and was found 35 cm from the incisors.      A 6 cm hiatal hernia was present.      An angulation deformity was found in the gastric body.      Patchy mildly erythematous mucosa without bleeding was  found in the       entire examined stomach. Biopsies were taken with a cold forceps for       histology and Helicobacter pylori testing.      No gross lesions were noted in the duodenal bulb, in the first portion       of the duodenum and in the second portion of the duodenum.      The major papilla was normal but angulated and hidden under a hood.      ENDOSONOGRAPHIC FINDING: :      Anechoic lesions suggestive of multiple cysts were identified in the       pancreatic head, pancreatic body and pancreatic tail. The largest cyst       measured 25 mm by 18 mm and was found in the head/uncinate region. The       second cyst measured 4 mm by 4 mm in the body/tail transition. The third       cyst measured 4 mm by 4 mm in the tail region. There was no associated       mass. As there had been concern of an enlarging cyst on imaging and as       oncology wanted a diagnostic evaluation, diagnostic needle aspiration       for fluid was performed. Color Doppler imaging was utilized prior to       needle puncture to confirm a lack of significant vascular structures       within the needle path. One pass was made with the expect 22 gauge       needle using a transduodenal approach. A stylet was used. The amount of       fluid collected was 1 0.2 mL. The fluid was clear, white and watery.       Sample(s) were sent for glucose, amylase concentration, cytology, CEA,       (GNAS mutation analysis and KRAS mutation analysis will be performed       pending CEA and glucose levels).      Pancreatic parenchymal abnormalities were noted in the pancreatic head       (PD = 1.9 mm), genu of the pancreas (PD = 1.4 mm), pancreatic body (PD =       0.9 mm) and pancreatic tail (  PD = 1.2 mm). These consisted of       hyperechoic strands.      There was no sign of significant endosonographic abnormality in the       common bile duct (3.6 mm) and in the common hepatic duct (6.3 mm). No       stones and no biliary sludge  were identified.      Endosonographic imaging in the visualized portion of the liver showed no       mass.      No malignant-appearing lymph nodes were visualized in the celiac region       (level 20), peripancreatic region and porta hepatis region.      The celiac region was visualized. Impression:               EGD impression:                           - No gross lesions in the proximal esophagus and in                            the mid esophagus.                           - Red blood in the distal esophagus. After being                            suction, this revealed LA Grade C esophagitis with                            mild oozing. Biopsied. 1 clip was placed for                            additional hemostasis.                           - Z-line irregular, 35 cm from the incisors.                           - 6 cm hiatal hernia.                           - Acquired angulation deformity in the gastric body.                           - Erythematous mucosa in the stomach. Biopsied.                           - No gross lesions in the duodenal bulb, in the                            first portion of the duodenum and in the second                            portion of the duodenum.                           -  Normal major papilla (angulated and hidden under                            a hood).                           EUS impression:                           - Multiple cystic lesions were seen in the                            pancreatic head, pancreatic body and pancreatic                            tail. The largest cyst was noted in the pancreatic                            head/uncinate region. Cytology results are pending.                            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 Fine needle aspiration for fluid                            performed of the  largest cyst. This has been sent                            for CEA/amylase/glucose/cytology (GNAS/KRAS testing                            based on results of CEA and glucose)                           - Pancreatic parenchymal abnormalities consisting                            of hyperechoic strands were noted in the pancreatic                            head, genu of the pancreas, pancreatic body and                            pancreatic tail.                           - There was no sign of significant pathology in the                            common bile duct and in the common  hepatic duct.                           - No malignant-appearing lymph nodes were                            visualized in the celiac region (level 20),                            peripancreatic region and porta hepatis region. Moderate Sedation:      Not Applicable - Patient had care per Anesthesia. Recommendation:           - The patient will be observed post-procedure,                            until all discharge criteria are met.                           - Discharge patient to home.                           - Patient has a contact number available for                            emergencies. The signs and symptoms of potential                            delayed complications were discussed with the                            patient. Return to normal activities tomorrow.                            Written discharge instructions were provided to the                            patient.                           - Low fat diet.                           - Observe patient's clinical course.                           - Await cytology results and await path results.                           - Patient to be initiated on PPI 40 mg twice daily.                           - Patient to be initiated on Carafate 4 times daily                            for 1 month and then twice daily.                           -  Pending pathology does not show dysplastic                            changes, recommend patient follow-up with primary                            gastroenterologist for repeat endoscopy in the next                            3 to 4 months.                           - Pending final cytology/pathology/fluid analysis                            from the pancreatic cyst, this is very suggestive                            of an IPMN, and unless the patient were to have                            high risk features or other changes on the fluid                            analysis, I would recommend likely repeat MRI/MRCP                            in 1 year.                           - The findings and recommendations were discussed                            with the patient.                           - The findings and recommendations were discussed                            with the patient's family. Procedure Code(s):        --- Professional ---                           [phone], Esophagogastroduodenoscopy, flexible,                            transoral; with transendoscopic ultrasound-guided                            intramural or transmural fine needle                            aspiration/biopsy(s), (includes endoscopic                            ultrasound examination limited to the  esophagus,                            stomach or duodenum, and adjacent structures)                           43255, 59, Esophagogastroduodenoscopy, flexible,                            transoral; with control of bleeding, any method                           43239, 59, Esophagogastroduodenoscopy, flexible,                            transoral; with biopsy, single or multiple Diagnosis Code(s):        --- Professional ---                           K22.89, Other specified disease of esophagus                           K20.91, Esophagitis, unspecified with bleeding                           K44.9,  Diaphragmatic hernia without obstruction or                            gangrene                           K31.89, Other diseases of stomach and duodenum                           K86.2, Cyst of pancreas                           K86.9, Disease of pancreas, unspecified                           I89.9, Noninfective disorder of lymphatic vessels                            and lymph nodes, unspecified CPT copyright 2022 American Medical Association. All rights reserved. The codes documented in this report are preliminary and upon coder review may  be revised to meet current compliance requirements. Justice Britain, MD 10/25/2022 10:35:29 AM Number of Addenda: 0

## 2022-10-25 NOTE — Anesthesia Postprocedure Evaluation (Signed)
Anesthesia Post Note  Patient: Aadya Vaness  Procedure(s) Performed: UPPER ENDOSCOPIC ULTRASOUND (EUS) RADIAL ESOPHAGOGASTRODUODENOSCOPY (EGD) WITH PROPOFOL BIOPSY FINE NEEDLE ASPIRATION (FNA) LINEAR HEMOSTASIS CLIP PLACEMENT     Patient location during evaluation: Endoscopy Anesthesia Type: MAC Level of consciousness: oriented, awake and alert and awake Pain management: pain level controlled Vital Signs Assessment: post-procedure vital signs reviewed and stable Respiratory status: spontaneous breathing, nonlabored ventilation, respiratory function stable and patient connected to nasal cannula oxygen Cardiovascular status: blood pressure returned to baseline and stable Postop Assessment: no headache, no backache and no apparent nausea or vomiting Anesthetic complications: no   No notable events documented.  Last Vitals:  Vitals:   10/25/22 1040 10/25/22 1050  BP: (!) 163/69 (!) 176/63  Pulse: 65 63  Resp: 12 12  Temp:    SpO2: 99%     Last Pain:  Vitals:   10/25/22 1050  TempSrc:   PainSc: 0-No pain                 Santa Lighter

## 2022-10-25 NOTE — Anesthesia Preprocedure Evaluation (Addendum)
Anesthesia Evaluation  Patient identified by MRN, date of birth, ID band Patient awake    Reviewed: Allergy & Precautions, NPO status , Patient's Chart, lab work & pertinent test results, reviewed documented beta blocker date and time   Airway Mallampati: II  TM Distance: >3 FB Neck ROM: Full    Dental  (+) Dental Advisory Given, Poor Dentition, Missing, Upper Dentures, Partial Lower   Pulmonary neg pulmonary ROS   Pulmonary exam normal breath sounds clear to auscultation       Cardiovascular hypertension, Pt. on home beta blockers and Pt. on medications (-) angina + CAD and +CHF  Normal cardiovascular exam+ dysrhythmias + pacemaker  Rhythm:Regular Rate:Normal  Echo 10/05/22: 1. Left ventricular ejection fraction, by estimation, is 45 to 50%. Left  ventricular ejection fraction by PLAX is 48 %. The left ventricle has  mildly decreased function. The left ventricle demonstrates global  hypokinesis. There is moderate left  ventricular hypertrophy. Left ventricular diastolic parameters are  consistent with Grade II diastolic dysfunction (pseudonormalization). The  average left ventricular global longitudinal strain is -12.6 %. The global  longitudinal strain is abnormal.   2. Right ventricular systolic function is normal. The right ventricular  size is normal. Mildly increased right ventricular wall thickness. There  is normal pulmonary artery systolic pressure. The estimated right  ventricular systolic pressure is 123456  mmHg.   3. Left atrial size was mildly dilated.   4. The mitral valve is normal in structure. Mild mitral valve  regurgitation. No evidence of mitral stenosis.   5. Tricuspid valve regurgitation is mild to moderate.   6. The aortic valve has an indeterminant number of cusps. Aortic valve  regurgitation is not visualized. No aortic stenosis is present.   7. The inferior vena cava is normal in size with greater than  50%  respiratory variability, suggesting right atrial pressure of 3 mmHg.     Neuro/Psych  Neuromuscular disease  negative psych ROS   GI/Hepatic negative GI ROS, Neg liver ROS,,,Pancreatic mass   Endo/Other  diabetes, Poorly Controlled, Type 2, Insulin Dependent    Renal/GU Renal InsufficiencyRenal disease     Musculoskeletal negative musculoskeletal ROS (+)    Abdominal   Peds  Hematology negative hematology ROS (+)   Anesthesia Other Findings Day of surgery medications reviewed with the patient.  Reproductive/Obstetrics                             Anesthesia Physical Anesthesia Plan  ASA: 3  Anesthesia Plan: MAC   Post-op Pain Management: Minimal or no pain anticipated   Induction: Intravenous  PONV Risk Score and Plan: 2 and TIVA and Treatment may vary due to age or medical condition  Airway Management Planned: Natural Airway and Simple Face Mask  Additional Equipment:   Intra-op Plan:   Post-operative Plan:   Informed Consent: I have reviewed the patients History and Physical, chart, labs and discussed the procedure including the risks, benefits and alternatives for the proposed anesthesia with the patient or authorized representative who has indicated his/her understanding and acceptance.     Dental advisory given  Plan Discussed with: CRNA and Anesthesiologist  Anesthesia Plan Comments:         Anesthesia Quick Evaluation

## 2022-10-25 NOTE — H&P (Signed)
GASTROENTEROLOGY PROCEDURE H&P NOTE   Primary Care Physician: Arthur Holms, NP  HPI: Bethany Gillespie is a 72 y.o. female who presents for EGD/EUS to evaluate pancreatic lesion/cyst that has been enlarging (normal CA19-9).  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 s/p Engineer, agricultural PPM in 03/2015   Bell's palsy    Bradycardia    CAD (coronary artery disease)    a. 02/2016 Cath: LM nl, LAD 30p, D1 small w/ severe ostial/mid dzs, LCX nl, RCA nl, EF <25%, glob HK.   Chronic combined systolic and diastolic CHF (congestive heart failure) ([place])    a. Echo in 03/2015: EF 55-60%, Grade 1 DD;  b. 02/2016 Echo: 25-30%, mod LVH, Gr1 DD, mild MR, mildly dil LA, prominent apical trabeculations.   Diabetes ([place])    Hernia of abdominal wall    HTN (hypertension)    Hypertension    NICM (nonischemic cardiomyopathy) ([place])    a. 02/2016 relatively non-obstructive cath, EF 25-30% by echo.   Past Surgical History:  Procedure Laterality Date   CARDIAC CATHETERIZATION Bilateral 03/01/2016   Procedure: Left Heart Cath and Coronary Angiography;  Surgeon: Minna Merritts, MD;  Location: [facility];  Service: Cardiovascular;  Laterality: Bilateral;   COLONOSCOPY WITH PROPOFOL N/A 05/06/2019   Procedure: COLONOSCOPY WITH PROPOFOL;  Surgeon: Toledo, Benay Pike, MD;  Location: [facility];  Service: Gastroenterology;  Laterality: N/A;   ESOPHAGOGASTRODUODENOSCOPY (EGD) WITH PROPOFOL N/A 05/06/2019   Procedure: ESOPHAGOGASTRODUODENOSCOPY (EGD) WITH PROPOFOL;  Surgeon: Toledo, Benay Pike, MD;  Location: [facility];  Service: Gastroenterology;  Laterality: N/A;   HERNIA REPAIR     INSERT / REPLACE / REMOVE PACEMAKER     PACEMAKER INSERTION     PACEMAKER INSERTION     Current Facility-Administered Medications  Medication Dose Route Frequency Provider Last Rate Last Admin   0.9 %  sodium chloride infusion   Intravenous Continuous Mansouraty, Telford Nab., MD         Current Facility-Administered Medications:    0.9 %  sodium chloride infusion, , Intravenous, Continuous, Mansouraty, Telford Nab., MD Allergies  Allergen Reactions   Bee Venom Anaphylaxis   Iodinated Contrast Media Shortness Of Breath    Eye pain    Crestor [Rosuvastatin] Other (See Comments)    Leg cramps   Lipitor [Atorvastatin] Other (See Comments)    Severe leg cramping   Family History  Problem Relation Age of Onset   Skin cancer Mother    Stroke Father    Diabetes Brother    Brain cancer Brother    Diabetes Mellitus I Grandchild    Breast cancer Daughter 35   Social History   Socioeconomic History   Marital status: Widowed    Spouse name: Not on file   Number of children: 5   Years of education: Not on file   Highest education level: Not on file  Occupational History   Not on file  Tobacco Use   Smoking status: Never   Smokeless tobacco: Never  Vaping Use   Vaping Use: Never used  Substance and Sexual Activity   Alcohol use: Not Currently   Drug use: No   Sexual activity: Not on file  Other Topics Concern   Not on file  Social History Narrative   Not on file   Social Determinants of Health   Financial Resource Strain: Not on file  Food Insecurity: Not on file  Transportation Needs: Not on  file  Physical Activity: Not on file  Stress: Not on file  Social Connections: Not on file  Intimate Partner Violence: Not on file    Physical Exam: Today's Vitals   10/25/22 0739  Weight: 60.3 kg  Height: '5\' 2"'$  (1.575 m)  PainSc: 0-No pain   Body mass index is 24.33 kg/m. GEN: NAD EYE: Sclerae anicteric ENT: MMM CV: Non-tachycardic GI: Soft, NT/ND NEURO:  Alert & Oriented x 3  Lab Results: No results for input(s): "WBC", "HGB", "HCT", "PLT" in the last 72 hours. BMET No results for input(s): "NA", "K", "CL", "CO2", "GLUCOSE", "BUN", "CREATININE", "CALCIUM" in the last 72 hours. LFT No results for input(s): "PROT", "ALBUMIN", "AST", "ALT",  "ALKPHOS", "BILITOT", "BILIDIR", "IBILI" in the last 72 hours. PT/INR No results for input(s): "LABPROT", "INR" in the last 72 hours.   Impression / Plan: This is a 72 y.o.female  who presents for EGD/EUS to evaluate pancreatic lesion/cyst that has been enlarging (normal CA19-9).  The risks of an EUS including intestinal perforation, bleeding, infection, aspiration, and medication effects were discussed as was the possibility it may not give a definitive diagnosis if a biopsy is performed.  When a biopsy of the pancreas is done as part of the EUS, there is an additional risk of pancreatitis at the rate of about 1-2%.  It was explained that procedure related pancreatitis is typically mild, although it can be severe and even life threatening, which is why we do not perform random pancreatic biopsies and only biopsy a lesion/area we feel is concerning enough to warrant the risk.   The risks and benefits of endoscopic evaluation/treatment were discussed with the patient and/or family; these include but are not limited to the risk of perforation, infection, bleeding, missed lesions, lack of diagnosis, severe illness requiring hospitalization, as well as anesthesia and sedation related illnesses.  The patient's history has been reviewed, patient examined, no change in status, and deemed stable for procedure.  The patient and/or family is agreeable to proceed.    Justice Britain, MD [facility] # [id number]

## 2022-10-25 NOTE — Discharge Instructions (Signed)

## 2022-10-25 NOTE — Anesthesia Procedure Notes (Signed)
Procedure Name: MAC Date/Time: 10/25/2022 9:32 AM  Performed by: Victoriano Lain, CRNAPre-anesthesia Checklist: Patient identified, Emergency Drugs available, Suction available, Patient being monitored and Timeout performed Patient Re-evaluated:Patient Re-evaluated prior to induction Oxygen Delivery Method: Simple face mask Placement Confirmation: positive ETCO2 Dental Injury: Teeth and Oropharynx as per pre-operative assessment

## 2022-10-26 ENCOUNTER — Telehealth: Payer: Self-pay | Admitting: Cardiovascular Disease

## 2022-10-26 ENCOUNTER — Encounter (HOSPITAL_COMMUNITY): Payer: Self-pay | Admitting: Gastroenterology

## 2022-10-26 LAB — CYTOLOGY - NON PAP

## 2022-10-26 LAB — SURGICAL PATHOLOGY

## 2022-10-26 NOTE — Telephone Encounter (Signed)
Called pt: she said she was told to go to the ER to be evaluated; does not know why she is having so many problems with her BP and why she is having problems getting BP medications? She said she is worried about a Stroke; but then said she does not want to go to the ER because she was just there yesterday, she doesn't drive, it is raining.  I told her that if she was told to go to the ER, then she should go. And if she is worried about a Stroke, then she needs to call 911 and an ambulance bring her to the ER.   She verbalized understanding.

## 2022-10-26 NOTE — Telephone Encounter (Signed)
Pt calling back concerning BP. Please address

## 2022-10-26 NOTE — Telephone Encounter (Signed)
Pt c/o medication issue:  1. Name of Medication: amLODipine (NORVASC) 2.5 MG tablet   2. How are you currently taking this medication (dosage and times per day)?    3. Are you having a reaction (difficulty breathing--STAT)? no  4. What is your medication issue? Patient wants to go back on the this medication. She states on yesterday her bp was 216/94. Please advise

## 2022-10-26 NOTE — Telephone Encounter (Signed)
Pt c/o BP issue: STAT if pt c/o blurred vision, one-sided weakness or slurred speech  1. What are your last 5 BP readings?  160/90 today after she took her carvedilol 216/94 yesterday.  208/80 yestertday, after they gave her a shot of blood pressure med at [facility], she had a procedure done. 160/85 Monday, 10/22/22  2. Are you having any other symptoms (ex. Dizziness, headache, blurred vision, passed out)? Patient states she is having dizziness.   3. What is your BP issue? Elevated BP.  Patient wants to know if amlodipine can be prescribed to her again.

## 2022-10-26 NOTE — Telephone Encounter (Signed)
Pt called stating for the past 2 weeks, she's noticed an increase in her blood pressure (readings listed below). Pt also report, yesterday after her procedure, her blood pressure was significantly elevated and required IV medication.   3/11- 160/85 3/14- 216/94           208/80 (after IV mediation) 3/15-160/90 (after carvedilol)          138/80 (currently)  Pt stated she currently feel dizzy and experiencing pressure in her right eye. She also stated if feels like her right eye can't focus.  Based on current symptoms, nurse recommended pt report to ER for further evaluations. Pt verbalized understanding

## 2022-10-26 NOTE — Telephone Encounter (Signed)
Called pt to discuss BP readings; no answer, left message with call back number

## 2022-10-27 ENCOUNTER — Encounter: Payer: Self-pay | Admitting: Gastroenterology

## 2022-10-29 ENCOUNTER — Other Ambulatory Visit: Payer: Self-pay | Admitting: Physician Assistant

## 2022-11-09 ENCOUNTER — Ambulatory Visit (INDEPENDENT_AMBULATORY_CARE_PROVIDER_SITE_OTHER): Payer: 59

## 2022-11-09 DIAGNOSIS — I441 Atrioventricular block, second degree: Secondary | ICD-10-CM

## 2022-11-09 LAB — CUP PACEART REMOTE DEVICE CHECK
Battery Remaining Longevity: 60 mo
Battery Remaining Percentage: 62 %
Brady Statistic RA Percent Paced: 1 %
Brady Statistic RV Percent Paced: 100 %
Date Time Interrogation Session: 20240329023100
Implantable Lead Connection Status: 753985
Implantable Lead Connection Status: 753985
Implantable Lead Implant Date: 20160810
Implantable Lead Implant Date: 20160810
Implantable Lead Location: 753859
Implantable Lead Location: 753860
Implantable Lead Model: 7740
Implantable Lead Model: 7741
Implantable Lead Serial Number: 640659
Implantable Lead Serial Number: 682646
Implantable Pulse Generator Implant Date: 20160810
Lead Channel Impedance Value: 603 Ohm
Lead Channel Impedance Value: 765 Ohm
Lead Channel Setting Pacing Amplitude: 2 V
Lead Channel Setting Pacing Amplitude: 2.4 V
Lead Channel Setting Pacing Pulse Width: 0.5 ms
Lead Channel Setting Sensing Sensitivity: 2.5 mV
Pulse Gen Serial Number: 722311
Zone Setting Status: 755011

## 2022-11-13 ENCOUNTER — Encounter: Payer: Self-pay | Admitting: Gastroenterology

## 2022-11-13 NOTE — Progress Notes (Signed)
Left message on machine to call back  

## 2022-11-13 NOTE — Progress Notes (Signed)
[facility] laboratories  CEA 4000 569 ng/mL Amylase 312 units/L Glucose less than 4.3 mg/dL  Pancreatic head cyst fluid Epithelial atypia suggestive of reactive/degenerative changes.  PancreaGEN analysis is to be completed but not back as of yet.  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  I think that once we get thePancreaGEN analysis we can determine what the follow-up will need to be, as the cyst has increased in size but so far is not showing high-grade changes.  We will update the patient to these partial results to know once the final analysis comes back we will determine, potentially after an Remy discussion, the overall surveillance and follow-up.  Will update the referring providers as well.   Justice Britain, MD [facility] Office # [phone]

## 2022-11-14 NOTE — Progress Notes (Signed)
The pt has been advised of the preliminary result.  She will be contacted as soon as we have the final.

## 2022-11-18 NOTE — Progress Notes (Signed)
PancraGEN results  CEA 4569 ng/mL Amylase 312 units/L Glucose<4.3 mg/dL  Cytology = hypocellular DNA quantity = moderate quantity DNA quality = good-quality KRAS = low clonality G12V GNAS = no mutation detected Tumor suppressor gene (LOH) = not detected  Based on the analysis, it is felt that this lesion has a 97% probability of benign disease over the next 3 years.  She has molecular alteration but lacks concerning clinical features otherwise.  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  The plan will be for MRI/MRCP in 1 year.  I will forward the referring providers these recommendations.  We will scan these results into the chart and also send a copy to the patient for her records.  I will have my team reach out to the patient   Bethany Parish, MD [facility] # [phone]

## 2022-11-19 NOTE — Progress Notes (Signed)
I have spoken to the pt and advised her of the results of IPMN She is agreeable to proceed with MRI in 1 year- staff message has been sent to myself to call pt  She also has been set up for follow up with Dr Meridee Score on 02/27/23 at 850 am EGD recall has been entered

## 2022-12-12 NOTE — Progress Notes (Signed)
Remote pacemaker transmission.   

## 2022-12-29 ENCOUNTER — Other Ambulatory Visit: Payer: Self-pay | Admitting: Internal Medicine

## 2022-12-29 DIAGNOSIS — E1159 Type 2 diabetes mellitus with other circulatory complications: Secondary | ICD-10-CM

## 2023-02-01 ENCOUNTER — Other Ambulatory Visit: Payer: Self-pay

## 2023-02-01 DIAGNOSIS — E1165 Type 2 diabetes mellitus with hyperglycemia: Secondary | ICD-10-CM

## 2023-02-01 MED ORDER — FREESTYLE LIBRE 2 SENSOR MISC: 1.0000 | 0 refills | Status: DC

## 2023-02-01 NOTE — Telephone Encounter (Signed)
1 sensor sent, so she can get it from the DME company.

## 2023-02-08 ENCOUNTER — Ambulatory Visit: Payer: 59 | Attending: Internal Medicine

## 2023-02-08 DIAGNOSIS — I441 Atrioventricular block, second degree: Secondary | ICD-10-CM

## 2023-02-10 LAB — CUP PACEART REMOTE DEVICE CHECK
Battery Remaining Longevity: 60 mo
Battery Remaining Percentage: 57 %
Brady Statistic RA Percent Paced: 1 %
Brady Statistic RV Percent Paced: 100 %
Date Time Interrogation Session: 20240628023100
Implantable Lead Connection Status: 753985
Implantable Lead Connection Status: 753985
Implantable Lead Implant Date: 20160810
Implantable Lead Implant Date: 20160810
Implantable Lead Location: 753859
Implantable Lead Location: 753860
Implantable Lead Model: 7740
Implantable Lead Model: 7741
Implantable Lead Serial Number: 640659
Implantable Lead Serial Number: 682646
Implantable Pulse Generator Implant Date: 20160810
Lead Channel Impedance Value: 615 Ohm
Lead Channel Impedance Value: 756 Ohm
Lead Channel Setting Pacing Amplitude: 2 V
Lead Channel Setting Pacing Amplitude: 2.4 V
Lead Channel Setting Pacing Pulse Width: 0.5 ms
Lead Channel Setting Sensing Sensitivity: 2.5 mV
Pulse Gen Serial Number: 722311
Zone Setting Status: 755011

## 2023-02-21 NOTE — Progress Notes (Signed)
Remote pacemaker transmission.   

## 2023-02-22 ENCOUNTER — Encounter: Payer: Self-pay | Admitting: Internal Medicine

## 2023-02-22 ENCOUNTER — Ambulatory Visit (INDEPENDENT_AMBULATORY_CARE_PROVIDER_SITE_OTHER): Payer: 59 | Admitting: Internal Medicine

## 2023-02-22 VITALS — BP 144/70 | HR 77 | Ht 62.0 in | Wt 140.2 lb

## 2023-02-22 DIAGNOSIS — E119 Type 2 diabetes mellitus without complications: Secondary | ICD-10-CM | POA: Diagnosis not present

## 2023-02-22 DIAGNOSIS — E1159 Type 2 diabetes mellitus with other circulatory complications: Secondary | ICD-10-CM

## 2023-02-22 DIAGNOSIS — E785 Hyperlipidemia, unspecified: Secondary | ICD-10-CM | POA: Diagnosis not present

## 2023-02-22 DIAGNOSIS — Z7985 Long-term (current) use of injectable non-insulin antidiabetic drugs: Secondary | ICD-10-CM

## 2023-02-22 DIAGNOSIS — E1165 Type 2 diabetes mellitus with hyperglycemia: Secondary | ICD-10-CM

## 2023-02-22 DIAGNOSIS — Z794 Long term (current) use of insulin: Secondary | ICD-10-CM

## 2023-02-22 LAB — HEMOGLOBIN A1C: Hemoglobin A1C: 7

## 2023-02-22 MED ORDER — OZEMPIC (1 MG/DOSE) 4 MG/3ML ~~LOC~~ SOPN
1.0000 mg | PEN_INJECTOR | SUBCUTANEOUS | 3 refills | Status: DC
Start: 2023-02-22 — End: 2023-06-27

## 2023-02-22 NOTE — Patient Instructions (Addendum)
Please increase: - Ozempic 1 mg weekly  Continue: - Lantus 14 units in am - Humalog for correction: 2-4 units    Please return in 3-4 months.

## 2023-02-22 NOTE — Progress Notes (Signed)
Patient ID: Bethany Gillespie, female   DOB: 1951-03-22, 72 y.o.   MRN: [id number]   HPI: Bethany Gillespie is a 72 y.o.-year-old female, initially referred by her previous endocrinologist, Dr. Tedd Sias, returning for follow-up for DM2, dx in 2000s, insulin-dependent  - started several years later, uncontrolled, with complications (CAD, nonischemic CHF, diabetic retinopathy, peripheral neuropathy, CKD stage IV with microalbuminuria).  She was previously seeing Dr. Tedd Sias but cannot drive to her office anymore due to her worsening vision she had to establish care with an endocrinologist in town.  Our last visit was 4 months ago.  Interim hx: No increased urination, nausea, chest pain. She has blurry vision (macular degeneration). She had eye sx. Right eye >> now she can read. Still cannot drive. Before last visit, she was found to have an enlarging pancreatic mass, suspicious of neoplasm.  She had a EUS with biopsy and this was read as statistically indolent mass (molecular alterations but without concerning clinical features), with a 97% probability of benign disease for the next 3 years. Since last visit, she had major stress with her daughter was living with her being shot (deemed a suicide, but patient doubts this) and she has declined custody for her children.  Also, her other daughter has metastatic breast cancer.  Reviewed HbA1c levels: Lab Results  Component Value Date   HGBA1C 6.8 (A) 10/23/2022   HGBA1C 6.7 (A) 06/01/2022   HGBA1C 7.1 (A) 12/15/2021   HGBA1C 6.8 (A) 08/17/2021   HGBA1C 9.7 (A) 05/16/2021   HGBA1C 9.4 (A) 02/10/2021   HGBA1C 8.9 (A) 11/03/2020   HGBA1C 12.9 (H) 02/28/2016   HGBA1C 12.0 (H) 02/27/2016   HGBA1C 9.7 (H) 02/25/2014  12/2019: HbA1c 8.9%  Pt is on a regimen of: - Ozempic 0.25 >> 0.5 mg weekly - added 02/2021 >> off >> restarted  - Lantus 40 >> 35 >> 25 >> 20 >> 17 >> 14 units at bedtime - Humalog 12 >> 8-12 >> 4-8 units before meal >> 4-8 >> 2-4 >> 1-2 >>  increased this by herself to 2-6 >> 2-4 units for correction only Prev. On Metformin and Actos.  Pt checks her sugars more than 4 times a day with her freestyle libre CGM:   Previously:  Prev.:  Lowest sugar was LO >> 30s >> 45; she has hypoglycemia awareness at 60s.  Highest sugar was 300 (gatorade) >> 200s >> 320 (chocolate banana).  Pt's meals are: - Breakfast: oatmeal + berries, yoghurt - Lunch: salad or cheese + crackers, soup - Dinner: meat + veggies or starch - Snacks: 3: nuts, popcorn  - + CKD with microalbuminuria - Dr. Thedore Mins, last BUN/creatinine:  Lab Results  Component Value Date   BUN 24 (H) 10/10/2022   BUN 24 (H) 10/05/2022   CREATININE 1.57 (H) 10/10/2022   CREATININE 1.85 (H) 10/05/2022  05/16/2019: 39/217, GFR 23, glucose 161, protein to creatinine ratio 0.156 (0.024-0.184) 09/14/2020: 20/1.9, GFR 26, glucose 174 05/24/2020: 25/2.1 glucose 118 She is on Entresto twice a day.  - + HL; last set of lipids: Lab Results  Component Value Date   CHOL 251 (H) 10/23/2022   HDL 68.00 10/23/2022   LDLCALC 159 (H) 10/23/2022   TRIG 117.0 10/23/2022   CHOLHDL 4 10/23/2022  08/2021: In PCPs office-I do not have these results 09/14/2020: 166/145/61.4/76 She was on Crestor 10 mg daily >> off 2/2 leg cramps.  Managed by cardiology (Dr. Kirke Corin).   - last eye exam 10/22/2022.  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 + IO injections.  - resolved numbness and tingling in her feet.  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  Last foot exam 10/23/2022.  Pt has FH of DM in brother and grandson (DM1).  Latest TSH was normal, at 1.074 on 08/29/2018. No results found for: "TSH" She is on iron sulfate for anemia. She has a history of adrenal adenoma in 04/2018 >> c/w benign adenoma. She saw nephrology for a renal cyst.  No h/o pancreatitis. No FH of MTC.  ROS: + See HPI + Blurry vision (macular degeneration)  Past Medical  History:  Diagnosis Date   AV block, Mobitz 2    a. s/p Field seismologist PPM in 03/2015   Bell's palsy    Bradycardia    CAD (coronary artery disease)    a. 02/2016 Cath: LM nl, LAD 30p, D1 small w/ severe ostial/mid dzs, LCX nl, RCA nl, EF <25%, glob HK.   Chronic combined systolic and diastolic CHF (congestive heart failure) (HCC)    a. Echo in 03/2015: EF 55-60%, Grade 1 DD;  b. 02/2016 Echo: 25-30%, mod LVH, Gr1 DD, mild MR, mildly dil LA, prominent apical trabeculations.   Diabetes (HCC)    Hernia of abdominal wall    HTN (hypertension)    Hypertension    NICM (nonischemic cardiomyopathy) (HCC)    a. 02/2016 relatively non-obstructive cath, EF 25-30% by echo.   Past Surgical History:  Procedure Laterality Date   BIOPSY  10/25/2022   Procedure: BIOPSY;  Surgeon: Meridee Score Netty Starring., MD;  Location: [facility];  Service: Gastroenterology;;   CARDIAC CATHETERIZATION Bilateral 03/01/2016   Procedure: Left Heart Cath and Coronary Angiography;  Surgeon: Antonieta Iba, MD;  Location: [facility]IVE CV [facility];  Service: Cardiovascular;  Laterality: Bilateral;   COLONOSCOPY WITH PROPOFOL N/A 05/06/2019   Procedure: COLONOSCOPY WITH PROPOFOL;  Surgeon: Toledo, Boykin Nearing, MD;  Location: [facility];  Service: Gastroenterology;  Laterality: N/A;   ESOPHAGOGASTRODUODENOSCOPY (EGD) WITH PROPOFOL N/A 05/06/2019   Procedure: ESOPHAGOGASTRODUODENOSCOPY (EGD) WITH PROPOFOL;  Surgeon: Toledo, Boykin Nearing, MD;  Location: [facility];  Service: Gastroenterology;  Laterality: N/A;   ESOPHAGOGASTRODUODENOSCOPY (EGD) WITH PROPOFOL N/A 10/25/2022   Procedure: ESOPHAGOGASTRODUODENOSCOPY (EGD) WITH PROPOFOL;  Surgeon: Meridee Score Netty Starring., MD;  Location: [facility];  Service: Gastroenterology;  Laterality: N/A;   EUS N/A 10/25/2022   Procedure: UPPER ENDOSCOPIC ULTRASOUND (EUS) RADIAL;  Surgeon: Lemar Lofty., MD;  Location: [facility];  Service: Gastroenterology;  Laterality:  N/A;   FINE NEEDLE ASPIRATION N/A 10/25/2022   Procedure: FINE NEEDLE ASPIRATION (FNA) LINEAR;  Surgeon: Lemar Lofty., MD;  Location: [facility];  Service: Gastroenterology;  Laterality: N/A;   HEMOSTASIS CLIP PLACEMENT  10/25/2022   Procedure: HEMOSTASIS CLIP PLACEMENT;  Surgeon: Meridee Score Netty Starring., MD;  Location: [facility];  Service: Gastroenterology;;   HERNIA REPAIR     INSERT / REPLACE / REMOVE PACEMAKER     PACEMAKER INSERTION     PACEMAKER INSERTION     Social History   Socioeconomic History   Marital status: Widowed    Spouse name: Not on file   Number of children: 5   Years of education: Not on file   Highest education level: Not on file  Occupational History   Not on file  Tobacco Use   Smoking status: Never   Smokeless tobacco: Never  Vaping Use   Vaping status: Never Used  Substance and Sexual Activity   Alcohol use: Not Currently  Drug use: No   Sexual activity: Not on file  Other Topics Concern   Not on file  Social History Narrative   Not on file   Social Determinants of Health   Financial Resource Strain: Not on file  Food Insecurity: Not on file  Transportation Needs: Not on file  Physical Activity: Not on file  Stress: Not on file  Social Connections: Not on file  Intimate Partner Violence: Not on file   Current Outpatient Medications on File Prior to Visit  Medication Sig Dispense Refill   blood glucose meter kit and supplies by Other route as directed. Contour next Meter. Dispense based on patient and insurance preference. Use up to four times daily as directed. (FOR ICD-10 E10.9, E11.9).     carvedilol (COREG) 6.25 MG tablet Take 1 tablet (6.25 mg total) by mouth 2 (two) times daily with a meal. 180 tablet 1   Cholecalciferol 25 MCG (1000 UT) tablet Take 2,000 Units by mouth daily.     Continuous Blood Gluc Receiver (FREESTYLE LIBRE 2 READER) DEVI 1 each by Does not apply route daily. 1 each 0   Continuous Glucose Sensor  (FREESTYLE LIBRE 2 SENSOR) MISC 1 each by Does not apply route every 14 (fourteen) days. 1 each 0   ferrous sulfate 325 (65 FE) MG tablet Take 325 mg by mouth daily with breakfast.     Glucagon 3 MG/DOSE POWD Place 3 mg into the nose once as needed for up to 1 dose. 1 each 11   insulin lispro (HUMALOG KWIKPEN) 100 UNIT/ML KwikPen Inject 4-8 Units into the skin 3 (three) times daily. 15 mL 3   Insulin Pen Needle 31G X 6 MM MISC Use 4 times daily 150 each 3   LANTUS SOLOSTAR 100 UNIT/ML Solostar Pen ADMINISTER 14 UNITS UNDER THE SKIN DAILY 15 mL 0   Multiple Vitamins-Minerals (MULTIVITAMIN WITH MINERALS) tablet Take 1 tablet by mouth daily.     Multiple Vitamins-Minerals (OCUVITE ADULT 50+ PO) Take 1 tablet by mouth daily.     omeprazole (PRILOSEC) 40 MG capsule Take 1 capsule (40 mg total) by mouth 2 (two) times daily before a meal. 60 capsule 6   potassium chloride (KLOR-CON) 10 MEQ tablet Take 10 mEq by mouth daily.     sacubitril-valsartan (ENTRESTO) 24-26 MG TAKE 1 TABLET BY MOUTH TWICE DAILY 60 tablet 4   Semaglutide,0.25 or 0.5MG /DOS, (OZEMPIC, 0.25 OR 0.5 MG/DOSE,) 2 MG/3ML SOPN INJECT 0.5 MG UNDER THE SKIN ONCE A WEEK (Patient taking differently: Inject 0.5 mg into the skin every Sunday.) 9 mL 1   sucralfate (CARAFATE) 1 GM/10ML suspension Take 10 mLs (1 g total) by mouth 4 (four) times daily. 4 times daily for 1 month and then twice daily. 420 mL 6   No current facility-administered medications on file prior to visit.   Allergies  Allergen Reactions   Bee Venom Anaphylaxis   Iodinated Contrast Media Shortness Of Breath    Eye pain    Crestor [Rosuvastatin] Other (See Comments)    Leg cramps   Lipitor [Atorvastatin] Other (See Comments)    Severe leg cramping   Family History  Problem Relation Age of Onset   Skin cancer Mother    Stroke Father    Diabetes Brother    Brain cancer Brother    Diabetes Mellitus I Grandchild    Breast cancer Daughter 39   PE: BP (!) 144/70    Pulse 77   Ht 5\' 2"  (1.575 m)  Wt 140 lb 3.2 oz (63.6 kg)   SpO2 98%   BMI 25.64 kg/m  Wt Readings from Last 3 Encounters:  02/22/23 140 lb 3.2 oz (63.6 kg)  10/25/22 133 lb (60.3 kg)  10/23/22 135 lb 12.8 oz (61.6 kg)   Constitutional: normal weight, in NAD Eyes:  EOMI, no exophthalmos ENT: no neck masses, no cervical lymphadenopathy Cardiovascular: RRR, No MRG Respiratory: CTA B Musculoskeletal: no deformities Skin:no rashes Neurological: no tremor with outstretched hands  ASSESSMENT: 1. DM2, insulin-dependent, uncontrolled, with long-term complications - CAD - nonischemic CHF - diabetic retinopathy - peripheral neuropathy - CKD stage IV with microalbuminuria  No family history of medullary thyroid cancer or personal history of pancreatitis.  2. HL  PLAN:  1. Patient with longstanding, uncontrolled, type 2 diabetes, on basal insulin and GLP-1 receptor agonist, and previously on mealtime insulin, now only using this for correction.  Last visit, she had some low blood sugars overnight but they were not as low as before.  Labs are fluctuating during the day, increasing after meals and then dropping abruptly.  We discussed about focusing on limiting her lows first as the highs could have been related to a recent episode of flu.  I advised her to move Lantus to the morning and use only 2 to 4 units of Humalog for correction. -She has a glucagon kit at home CGM interpretation: -At today's visit, we reviewed her CGM downloads: It appears that 81% of values are in target range (goal >70%), while 17% are higher than 180 (goal <25%), and 2% are lower than 70 (goal <4%).  The calculated average blood sugar is 139.  The projected HbA1c for the next 3 months (GMI) is 6.6%. -Reviewing the CGM trends, sugars appear to be at goal overnight, but they appear to be increased about the 180 mark after certain meals.  As of now, she is using Humalog low-dose only for correction.  We discussed that  for some meals she needs better coverage and I suggested to increase the Ozempic dose to 1 mg weekly.  She agrees with this dose.  Another option would have been to have her inject 2 to 4 units of Humalog before larger meals, but I am hoping that in the near future we could eliminate Humalog completely. - I suggested to:  Patient Instructions  Please increase: - Ozempic 1 mg weekly  Continue: - Lantus 14 units in am - Humalog for correction: 2-4 units    Please return in 3-4 months.   - we checked her HbA1c: 7.0% (slightly higher) - advised to check sugars at different times of the day - 4x a day, rotating check times - advised for yearly eye exams >> she is UTD - return to clinic in 3-4 months  2. HL -Latest lipid panel was reviewed from 10/2022: LDL above target, much worse after coming off Crestor, the rest of the fractions at goal: Lab Results  Component Value Date   CHOL 251 (H) 10/23/2022   HDL 68.00 10/23/2022   LDLCALC 159 (H) 10/23/2022   TRIG 117.0 10/23/2022   CHOLHDL 4 10/23/2022  -Crestor 10 mg daily had to be stopped due to muscle cramps.  At last visit I suggested pravastatin, which is less likely to cause muscle cramps, however, she did not start this.  Carlus Pavlov, MD PhD [facility]

## 2023-02-27 ENCOUNTER — Ambulatory Visit (INDEPENDENT_AMBULATORY_CARE_PROVIDER_SITE_OTHER): Payer: 59 | Admitting: Gastroenterology

## 2023-02-27 ENCOUNTER — Encounter: Payer: Self-pay | Admitting: Gastroenterology

## 2023-02-27 ENCOUNTER — Other Ambulatory Visit (INDEPENDENT_AMBULATORY_CARE_PROVIDER_SITE_OTHER): Payer: 59

## 2023-02-27 VITALS — BP 120/80 | HR 70 | Ht 62.0 in | Wt 137.8 lb

## 2023-02-27 DIAGNOSIS — R197 Diarrhea, unspecified: Secondary | ICD-10-CM | POA: Diagnosis not present

## 2023-02-27 DIAGNOSIS — D490 Neoplasm of unspecified behavior of digestive system: Secondary | ICD-10-CM

## 2023-02-27 DIAGNOSIS — K2100 Gastro-esophageal reflux disease with esophagitis, without bleeding: Secondary | ICD-10-CM | POA: Diagnosis not present

## 2023-02-27 DIAGNOSIS — K219 Gastro-esophageal reflux disease without esophagitis: Secondary | ICD-10-CM

## 2023-02-27 DIAGNOSIS — K209 Esophagitis, unspecified without bleeding: Secondary | ICD-10-CM

## 2023-02-27 DIAGNOSIS — R1030 Lower abdominal pain, unspecified: Secondary | ICD-10-CM | POA: Diagnosis not present

## 2023-02-27 LAB — TSH: TSH: 0.49 u[IU]/mL (ref 0.35–5.50)

## 2023-02-27 NOTE — Patient Instructions (Signed)
Your provider has requested that you go to the basement level for lab work before leaving today. Press "B" on the elevator. The lab is located at the first door on the left as you exit the elevator.  You have been scheduled for an endoscopy. Please follow written instructions given to you at your visit today.  If you use inhalers (even only as needed), please bring them with you on the day of your procedure.  If you take any of the following medications, they will need to be adjusted prior to your procedure:   DO NOT TAKE 7 DAYS PRIOR TO TEST- Trulicity (dulaglutide) Ozempic, Wegovy (semaglutide) Mounjaro (tirzepatide) Bydureon Bcise (exanatide extended release)  DO NOT TAKE 1 DAY PRIOR TO YOUR TEST Rybelsus (semaglutide) Adlyxin (lixisenatide) Victoza (liraglutide) Byetta (exanatide) ___________________________________________________________________________   _______________________________________________________  If your blood pressure at your visit was 140/90 or greater, please contact your primary care physician to follow up on this.  _______________________________________________________  If you are age 83 or older, your body mass index should be between 23-30. Your Body mass index is 25.2 kg/m. If this is out of the aforementioned range listed, please consider follow up with your Primary Care Provider.  If you are age 70 or younger, your body mass index should be between 19-25. Your Body mass index is 25.2 kg/m. If this is out of the aformentioned range listed, please consider follow up with your Primary Care Provider.   ________________________________________________________  The [facility] providers would like to encourage you to use [facility] to communicate with providers for non-urgent requests or questions.  Due to long hold times on the telephone, sending your provider a message by [facility] may be a faster and more efficient way to get a response.  Please allow 48  business hours for a response.  Please remember that this is for non-urgent requests.  _______________________________________________________  Due to recent changes in healthcare laws, you may see the results of your imaging and laboratory studies on MyChart before your provider has had a chance to review them.  We understand that in some cases there may be results that are confusing or concerning to you. Not all laboratory results come back in the same time frame and the provider may be waiting for multiple results in order to interpret others.  Please give [place] 48 hours in order for your provider to thoroughly review all the results before contacting the office for clarification of your results.   Thank you for choosing me and [facility].  Dr. Meridee Score

## 2023-02-27 NOTE — Progress Notes (Signed)
GASTROENTEROLOGY OUTPATIENT CLINIC VISIT   Primary Care Provider Loura Back, NP [address] [place] [address] [phone]   Patient Profile: Bethany Gillespie is a 72 y.o. female with a pmh significant for CAD, CHF (status post PPM), hypertension, PVD, pancreatic cyst-IPMN, hiatal hernia, diverticulosis.  The patient presents to the [facility] for an evaluation and management of problem(s) noted below:  Problem List 1. IPMN (intraductal papillary mucinous neoplasm)   2. Gastroesophageal reflux disease with esophagitis without hemorrhage   3. Diarrhea, unspecified type   4. Abdominal pain, lower     History of Present Illness This is the patient's first visit to the outpatient [facility].  I met her earlier this year for evaluation of a pancreatic cyst as a referral.  Her gastroenterologist was Dr. Norma Fredrickson from [facility], but as the patient has moved to [facility] from [place] she wanted to transition her care so she wants to continue her full GI care here.  Her EUS results are noted below but showed that this was a mucinous cyst with what appeared to be on EUS and IPMN.  Our plan was for a 1 year follow-up MRI/MRCP and close monitoring.  This had been found incidentally years ago but have enlarged in size.  She still remains an adequate candidate for surveillance.  She had colon cancer screening back in 2020 with plan for 10-year follow-up.  The patient will have infrequent short episodes of lower abdominal discomfort that come and go within a few seconds.  It does not prevent her from continuing to do her normal activities.  This has been a longstanding issue which she has just learned to live with.  She will have infrequent episodes of looser bowel movements as well.  These come and go and are not on a regular basis.  She does not recall any blood in her stools.  She continues on her PPI therapy.  GI Review of Systems Positive as above Negative for  dysphagia, odynophagia, bloating, melena, hematochezia  Review of Systems General: Denies fevers/chills/weight loss unintentionally Cardiovascular: Denies chest pain Pulmonary: Denies shortness of breath Gastroenterological: See HPI Genitourinary: Denies darkened urine Hematological: Denies easy bruising/bleeding Endocrine: Denies temperature intolerance Dermatological: Denies jaundice Psychological: Mood is stable   Medications Current Outpatient Medications  Medication Sig Dispense Refill   blood glucose meter kit and supplies by Other route as directed. Contour next Meter. Dispense based on patient and insurance preference. Use up to four times daily as directed. (FOR ICD-10 E10.9, E11.9).     carvedilol (COREG) 6.25 MG tablet Take 1 tablet (6.25 mg total) by mouth 2 (two) times daily with a meal. 180 tablet 1   Cholecalciferol 25 MCG (1000 UT) tablet Take 2,000 Units by mouth daily.     Continuous Blood Gluc Receiver (FREESTYLE LIBRE 2 READER) DEVI 1 each by Does not apply route daily. 1 each 0   Continuous Glucose Sensor (FREESTYLE LIBRE 2 SENSOR) MISC 1 each by Does not apply route every 14 (fourteen) days. 1 each 0   ferrous sulfate 325 (65 FE) MG tablet Take 325 mg by mouth daily with breakfast.     Glucagon 3 MG/DOSE POWD Place 3 mg into the nose once as needed for up to 1 dose. 1 each 11   insulin lispro (HUMALOG KWIKPEN) 100 UNIT/ML KwikPen Inject 4-8 Units into the skin 3 (three) times daily. 15 mL 3   Insulin Pen Needle 31G X 6 MM MISC Use 4 times daily 150 each  3   LANTUS SOLOSTAR 100 UNIT/ML Solostar Pen ADMINISTER 14 UNITS UNDER THE SKIN DAILY 15 mL 0   Multiple Vitamins-Minerals (MULTIVITAMIN WITH MINERALS) tablet Take 1 tablet by mouth daily.     Multiple Vitamins-Minerals (OCUVITE ADULT 50+ PO) Take 1 tablet by mouth daily.     potassium chloride (KLOR-CON) 10 MEQ tablet Take 10 mEq by mouth daily.     sacubitril-valsartan (ENTRESTO) 24-26 MG TAKE 1 TABLET BY MOUTH  TWICE DAILY 60 tablet 4   Semaglutide, 1 MG/DOSE, (OZEMPIC, 1 MG/DOSE,) 4 MG/3ML SOPN Inject 1 mg into the skin once a week. 9 mL 3   omeprazole (PRILOSEC) 40 MG capsule Take 1 capsule (40 mg total) by mouth 2 (two) times daily before a meal. 60 capsule 6   No current facility-administered medications for this visit.    Allergies Allergies  Allergen Reactions   Bee Venom Anaphylaxis   Iodinated Contrast Media Shortness Of Breath    Eye pain    Crestor [Rosuvastatin] Other (See Comments)    Leg cramps   Lipitor [Atorvastatin] Other (See Comments)    Severe leg cramping    Histories Past Medical History:  Diagnosis Date   AV block, Mobitz 2    a. s/p Field seismologist PPM in 03/2015   Bell's palsy    Bradycardia    CAD (coronary artery disease)    a. 02/2016 Cath: LM nl, LAD 30p, D1 small w/ severe ostial/mid dzs, LCX nl, RCA nl, EF <25%, glob HK.   Chronic combined systolic and diastolic CHF (congestive heart failure) (HCC)    a. Echo in 03/2015: EF 55-60%, Grade 1 DD;  b. 02/2016 Echo: 25-30%, mod LVH, Gr1 DD, mild MR, mildly dil LA, prominent apical trabeculations.   Diabetes (HCC)    Hernia of abdominal wall    HTN (hypertension)    Hypertension    NICM (nonischemic cardiomyopathy) (HCC)    a. 02/2016 relatively non-obstructive cath, EF 25-30% by echo.   Past Surgical History:  Procedure Laterality Date   BIOPSY  10/25/2022   Procedure: BIOPSY;  Surgeon: Meridee Score Netty Starring., MD;  Location: [facility];  Service: Gastroenterology;;   CARDIAC CATHETERIZATION Bilateral 03/01/2016   Procedure: Left Heart Cath and Coronary Angiography;  Surgeon: Antonieta Iba, MD;  Location: [facility];  Service: Cardiovascular;  Laterality: Bilateral;   COLONOSCOPY WITH PROPOFOL N/A 05/06/2019   Procedure: COLONOSCOPY WITH PROPOFOL;  Surgeon: Toledo, Boykin Nearing, MD;  Location: [facility];  Service: Gastroenterology;  Laterality: N/A;   ESOPHAGOGASTRODUODENOSCOPY  (EGD) WITH PROPOFOL N/A 05/06/2019   Procedure: ESOPHAGOGASTRODUODENOSCOPY (EGD) WITH PROPOFOL;  Surgeon: Toledo, Boykin Nearing, MD;  Location: [facility];  Service: Gastroenterology;  Laterality: N/A;   ESOPHAGOGASTRODUODENOSCOPY (EGD) WITH PROPOFOL N/A 10/25/2022   Procedure: ESOPHAGOGASTRODUODENOSCOPY (EGD) WITH PROPOFOL;  Surgeon: Meridee Score Netty Starring., MD;  Location: [facility];  Service: Gastroenterology;  Laterality: N/A;   EUS N/A 10/25/2022   Procedure: UPPER ENDOSCOPIC ULTRASOUND (EUS) RADIAL;  Surgeon: Lemar Lofty., MD;  Location: [facility];  Service: Gastroenterology;  Laterality: N/A;   FINE NEEDLE ASPIRATION N/A 10/25/2022   Procedure: FINE NEEDLE ASPIRATION (FNA) LINEAR;  Surgeon: Lemar Lofty., MD;  Location: [facility];  Service: Gastroenterology;  Laterality: N/A;   HEMOSTASIS CLIP PLACEMENT  10/25/2022   Procedure: HEMOSTASIS CLIP PLACEMENT;  Surgeon: Lemar Lofty., MD;  Location: [facility];  Service: Gastroenterology;;   HERNIA REPAIR     INSERT / REPLACE / REMOVE PACEMAKER  PACEMAKER INSERTION     PACEMAKER INSERTION     Social History   Socioeconomic History   Marital status: Widowed    Spouse name: Not on file   Number of children: 5   Years of education: Not on file   Highest education level: Not on file  Occupational History   Not on file  Tobacco Use   Smoking status: Never   Smokeless tobacco: Never  Vaping Use   Vaping status: Never Used  Substance and Sexual Activity   Alcohol use: Not Currently   Drug use: No   Sexual activity: Not on file  Other Topics Concern   Not on file  Social History Narrative   Not on file   Social Determinants of Health   Financial Resource Strain: Not on file  Food Insecurity: Not on file  Transportation Needs: Not on file  Physical Activity: Not on file  Stress: Not on file  Social Connections: Not on file  Intimate Partner Violence: Not on file   Family History   Problem Relation Age of Onset   Skin cancer Mother    Stroke Father    Diabetes Brother    Brain cancer Brother    Breast cancer Daughter 13   Diabetes Mellitus I Grandchild    Colon cancer Neg Hx    Esophageal cancer Neg Hx    Inflammatory bowel disease Neg Hx    Liver disease Neg Hx    Pancreatic cancer Neg Hx    Rectal cancer Neg Hx    Stomach cancer Neg Hx    I have reviewed her medical, social, and family history in detail and updated the electronic medical record as necessary.    PHYSICAL EXAMINATION  BP 120/80   Pulse 70   Ht 5\' 2"  (1.575 m)   Wt 137 lb 12.8 oz (62.5 kg)   SpO2 98%   BMI 25.20 kg/m  Wt Readings from Last 3 Encounters:  02/27/23 137 lb 12.8 oz (62.5 kg)  02/22/23 140 lb 3.2 oz (63.6 kg)  10/25/22 133 lb (60.3 kg)  GEN: NAD, appears stated age, doesn't appear chronically ill PSYCH: Cooperative, without pressured speech EYE: Conjunctivae pink, sclerae anicteric ENT: MMM CV: Nontachycardic RESP: No audible wheezing GI: NABS, soft, NT/ND, without rebound MSK/EXT: Trace bilateral pedal edema SKIN: No jaundice NEURO:  Alert & Oriented x 3, no focal deficits   REVIEW OF DATA  I reviewed the following data at the time of this encounter:  GI Procedures and Studies  April 2024 EUS EGD impression: - No gross lesions in the proximal esophagus and in the mid esophagus. - Red blood in the distal esophagus. After being suction, this revealed LA Grade C esophagitis with mild oozing. Biopsied. 1 clip was placed for additional hemostasis. - Z-line irregular, 35 cm from the incisors. - 6 cm hiatal hernia. - Acquired angulation deformity in the gastric body. - Erythematous mucosa in the stomach. Biopsied. - No gross lesions in the duodenal bulb, in the first portion of the duodenum and in the second portion of the duodenum. - Normal major papilla (angulated and hidden under a hood). EUS impression: - Multiple cystic lesions were seen in the pancreatic head,  pancreatic body and pancreatic tail. The largest cyst was noted in the pancreatic head/uncinate region. Cytology results are pending. However, the endosonographic appearance is suggestive of an intraductal papillary mucinous neoplasm. Fine needle aspiration for fluid performed of the largest cyst. This has been sent for CEA/amylase/glucose/cytology (GNAS/KRAS testing  based on results of CEA and glucose) - Pancreatic parenchymal abnormalities consisting of hyperechoic strands were noted in the pancreatic head, genu of the pancreas, pancreatic body and pancreatic tail. - There was no sign of significant pathology in the common bile duct and in the common hepatic duct. - No malignant-appearing lymph nodes were visualized in the celiac region (level 20), peripancreatic region and porta hepatis region.  PancraGEN results CEA 4569 ng/mL Amylase 312 units/L Glucose<4.3 mg/dL Cytology = hypocellular DNA quantity = moderate quantity DNA quality = good-quality KRAS = low clonality G12V GNAS = no mutation detected Tumor suppressor gene (LOH) = not detected   Based on the analysis, it is felt that this lesion has a 97% probability of benign disease over the next 3 years.  She has molecular alteration but lacks concerning clinical features otherwise.   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  2020 outside colonoscopy Impression:           - Diverticulosis in the sigmoid colon and in the                        ascending colon.                       - Non-bleeding internal hemorrhoids.                       - The examination was otherwise normal.   Laboratory Studies  Reviewed those in epic  Imaging Studies  January 2024 MRI/MRCP IMPRESSION: 1. Substantial enlargement of multilocular cystic lesion in the pancreatic head and uncinate process, concerning for neoplasm. Further evaluation with endoscopic ultrasound and consideration for tissue sampling to establish a tissue  diagnosis is recommended in the near future. 2. Small enhancing lesion in the lower pole of the right kidney, grossly unchanged in size compared to prior study from 11/21/2020. This may represent a benign lesions such as a partially involuted cyst with chronically thickened wall, however, neoplasm is difficult to entirely exclude. Multiple other Bosniak 1 and Bosniak class 2 cysts are noted. Repeat abdominal MRI with and without IV gadolinium should be considered in 1 year to ensure continued stability. 3. Small left adrenal adenoma incidentally noted. 4. Large hiatal hernia.   ASSESSMENT  Ms. Secrease is a 72 y.o. female with a pmh significant for CAD, CHF (status post PPM), hypertension, PVD, pancreatic cyst-IPMN, hiatal hernia, diverticulosis.  The patient is seen today for evaluation and management of:  1. IPMN (intraductal papillary mucinous neoplasm)   2. Gastroesophageal reflux disease with esophagitis without hemorrhage   3. Diarrhea, unspecified type   4. Abdominal pain, lower    The patient is hemodynamically and clinically stable at this time.  We are going to continue to monitor her pancreatic cyst, IPMN/mucinous cyst due to her continued health.  Will plan 1 year follow-up MRI/MRCP.  We can then decide surveillance interval and timing to be maintained depending on her overall health moving forward.  Will do some laboratories for evaluation of her symptoms.  She will need a repeat upper endoscopy for follow-up of her esophagitis which we will schedule.  The etiology of her chronic lower abdominal discomfort that comes and goes for a few seconds is not clearly defined though it could be abdominal spasms.  Could consider Levsin/Bentyl in future.  Continue her current PPI dosing until timing of endoscopy.  She will be due for colon cancer screening in 2030.  The risks and benefits of endoscopic evaluation were discussed with the patient; these include but are not limited to the risk of  perforation, infection, bleeding, missed lesions, lack of diagnosis, severe illness requiring hospitalization, as well as anesthesia and sedation related illnesses.  The patient and/or family is agreeable to proceed.  All patient questions were answered to the best of my ability, and the patient agrees to the aforementioned plan of action with follow-up as indicated.   PLAN  Laboratories as outlined below Proceed with scheduling EGD for follow-up of esophagitis Colonoscopy recall for screening in 2030 MRI/MRCP 1 year follow-up from the time of EUS (approximately April 2025) Continue omeprazole 40 mg twice daily until time of endoscopy with hope that we will be able to decrease dosing as able   Orders Placed This Encounter  Procedures   TSH   IgA   Tissue transglutaminase, IgA   Ambulatory referral to Gastroenterology    New Prescriptions   No medications on file   Modified Medications   Modified Medication Previous Medication   OMEPRAZOLE (PRILOSEC) 40 MG CAPSULE omeprazole (PRILOSEC) 40 MG capsule      Take 1 capsule (40 mg total) by mouth 2 (two) times daily before a meal.    Take 1 capsule (40 mg total) by mouth 2 (two) times daily before a meal.    Planned Follow Up No follow-ups on file.   Total Time in Face-to-Face and in Coordination of Care for patient including independent/personal interpretation/review of prior testing, medical history, examination, medication adjustment, communicating results with the patient directly, and documentation within the EHR is 25 minutes.   Corliss Parish, MD [facility] Office # [phone]

## 2023-02-28 LAB — TISSUE TRANSGLUTAMINASE, IGA: (tTG) Ab, IgA: <1 U/mL

## 2023-02-28 LAB — IGA: Immunoglobulin A: 222 mg/dL (ref 70–320)

## 2023-03-02 ENCOUNTER — Encounter: Payer: Self-pay | Admitting: Gastroenterology

## 2023-03-02 DIAGNOSIS — R1030 Lower abdominal pain, unspecified: Secondary | ICD-10-CM | POA: Insufficient documentation

## 2023-03-02 DIAGNOSIS — K219 Gastro-esophageal reflux disease without esophagitis: Secondary | ICD-10-CM | POA: Insufficient documentation

## 2023-03-02 DIAGNOSIS — R197 Diarrhea, unspecified: Secondary | ICD-10-CM | POA: Insufficient documentation

## 2023-03-02 DIAGNOSIS — D490 Neoplasm of unspecified behavior of digestive system: Secondary | ICD-10-CM | POA: Insufficient documentation

## 2023-03-02 MED ORDER — OMEPRAZOLE 40 MG PO CPDR: 40.0000 mg | DELAYED_RELEASE_CAPSULE | Freq: Two times a day (BID) | ORAL | 6 refills | Status: DC

## 2023-03-22 ENCOUNTER — Other Ambulatory Visit: Payer: Self-pay | Admitting: Internal Medicine

## 2023-03-22 DIAGNOSIS — E1165 Type 2 diabetes mellitus with hyperglycemia: Secondary | ICD-10-CM

## 2023-04-09 ENCOUNTER — Encounter (HOSPITAL_COMMUNITY): Payer: Self-pay | Admitting: *Deleted

## 2023-04-09 ENCOUNTER — Other Ambulatory Visit: Payer: Self-pay

## 2023-04-09 ENCOUNTER — Emergency Department (HOSPITAL_COMMUNITY)
Admission: EM | Admit: 2023-04-09 | Discharge: 2023-04-10 | Disposition: A | Discharge status: Home / Self Care | Payer: 59 | Attending: Emergency Medicine | Admitting: Emergency Medicine

## 2023-04-09 DIAGNOSIS — I10 Essential (primary) hypertension: Secondary | ICD-10-CM | POA: Insufficient documentation

## 2023-04-09 DIAGNOSIS — Z794 Long term (current) use of insulin: Secondary | ICD-10-CM | POA: Diagnosis not present

## 2023-04-09 DIAGNOSIS — M25511 Pain in right shoulder: Secondary | ICD-10-CM | POA: Diagnosis not present

## 2023-04-09 DIAGNOSIS — Z79899 Other long term (current) drug therapy: Secondary | ICD-10-CM | POA: Insufficient documentation

## 2023-04-09 DIAGNOSIS — R944 Abnormal results of kidney function studies: Secondary | ICD-10-CM | POA: Insufficient documentation

## 2023-04-09 DIAGNOSIS — E119 Type 2 diabetes mellitus without complications: Secondary | ICD-10-CM | POA: Insufficient documentation

## 2023-04-09 NOTE — ED Triage Notes (Signed)
Pt reporting right neck pain, radiates down the right arm and the upper back. Pt reports the pain was there when she woke up yesterday morning.  Took asa, tylenol and ibuprofen without relief. Some tingling in the pinky finger.

## 2023-04-10 ENCOUNTER — Emergency Department (HOSPITAL_COMMUNITY): Payer: 59

## 2023-04-10 DIAGNOSIS — M25511 Pain in right shoulder: Secondary | ICD-10-CM | POA: Diagnosis not present

## 2023-04-10 LAB — COMPREHENSIVE METABOLIC PANEL
ALT: 21 U/L (ref 0–44)
AST: 15 U/L (ref 15–41)
Albumin: 4.1 g/dL (ref 3.5–5.0)
Alkaline Phosphatase: 65 U/L (ref 38–126)
Anion gap: 12 (ref 5–15)
BUN: 24 mg/dL — ABNORMAL HIGH (ref 8–23)
CO2: 22 mmol/L (ref 22–32)
Calcium: 8.9 mg/dL (ref 8.9–10.3)
Chloride: 104 mmol/L (ref 98–111)
Creatinine, Ser: 2.16 mg/dL — ABNORMAL HIGH (ref 0.44–1.00)
GFR, Estimated: 24 mL/min — ABNORMAL LOW (ref >60)
Glucose, Bld: 190 mg/dL — ABNORMAL HIGH (ref 70–99)
Potassium: 3.4 mmol/L — ABNORMAL LOW (ref 3.5–5.1)
Sodium: 138 mmol/L (ref 135–145)
Total Bilirubin: 1.2 mg/dL (ref 0.3–1.2)
Total Protein: 6.9 g/dL (ref 6.5–8.1)

## 2023-04-10 LAB — CBC WITH DIFFERENTIAL/PLATELET
Abs Immature Granulocytes: 0.01 10*3/uL (ref 0.00–0.07)
Basophils Absolute: 0.1 10*3/uL (ref 0.0–0.1)
Basophils Relative: 1 %
Eosinophils Absolute: 0.2 10*3/uL (ref 0.0–0.5)
Eosinophils Relative: 3 %
HCT: 35.1 % — ABNORMAL LOW (ref 36.0–46.0)
Hemoglobin: 11.5 g/dL — ABNORMAL LOW (ref 12.0–15.0)
Immature Granulocytes: 0 %
Lymphocytes Relative: 34 %
Lymphs Abs: 2.2 10*3/uL (ref 0.7–4.0)
MCH: 28.4 pg (ref 26.0–34.0)
MCHC: 32.8 g/dL (ref 30.0–36.0)
MCV: 86.7 fL (ref 80.0–100.0)
Monocytes Absolute: 0.5 10*3/uL (ref 0.1–1.0)
Monocytes Relative: 7 %
Neutro Abs: 3.6 10*3/uL (ref 1.7–7.7)
Neutrophils Relative %: 55 %
Platelets: 181 10*3/uL (ref 150–400)
RBC: 4.05 MIL/uL (ref 3.87–5.11)
RDW: 13.3 % (ref 11.5–15.5)
WBC: 6.5 10*3/uL (ref 4.0–10.5)
nRBC: 0 % (ref 0.0–0.2)

## 2023-04-10 MED ORDER — HYDROCODONE-ACETAMINOPHEN 5-325 MG PO TABS: 1.0000 | ORAL_TABLET | ORAL | 0 refills | Status: DC | PRN

## 2023-04-10 MED ORDER — HYDROCODONE-ACETAMINOPHEN 5-325 MG PO TABS
1.0000 | ORAL_TABLET | Freq: Once | ORAL | Status: AC
  Administered 2023-04-10: 1 via ORAL
  Filled 2023-04-10: qty 1

## 2023-04-10 MED ORDER — HYDROCODONE-ACETAMINOPHEN 5-325 MG PO TABS: 1.0000 | ORAL_TABLET | ORAL | 0 refills | Status: AC | PRN

## 2023-04-10 MED ORDER — CARVEDILOL 3.125 MG PO TABS
6.2500 mg | ORAL_TABLET | Freq: Two times a day (BID) | ORAL | Status: DC
  Administered 2023-04-10: 6.25 mg via ORAL
  Filled 2023-04-10: qty 2

## 2023-04-10 NOTE — ED Provider Notes (Signed)
[facility] Provider Note   CSN: [id number] Arrival date & time: 04/09/23  2137     History HTN, DM No chief complaint on file.   Bethany Gillespie is a 72 y.o. female.  72 y.o female with a PMH of HTN, Diabetes presents to the ED with a chief complaint of severe right shoulder aching which began yesterday.  Patient endorses pain starting at her right shoulder tracing all the way up to her neck, and then radiating down her arm.  She does not have any arm weakness.  She did try some aspirin, Advil, Tylenol without any improvement in symptoms.  Patient states the pain is more severe if she lays on the right arm but it does hurt to move it.  She did arrive to the ED with an elevated blood pressure with a systolic in the 200s, last took her BP meds yesterday.  No prior cervical spine issues. No chest pain, no shortness of breath, no leg swelling or other complaints.      The history is provided by the patient and medical records.       Home Medications Prior to Admission medications   Medication Sig Start Date End Date Taking? Authorizing Provider  HYDROcodone-acetaminophen (NORCO/VICODIN) 5-325 MG tablet Take 1 tablet by mouth every 4 (four) hours as needed for up to 3 days. 04/10/23 04/13/23 Yes Korri Ask, PA-C  blood glucose meter kit and supplies by Other route as directed. Contour next Meter. Dispense based on patient and insurance preference. Use up to four times daily as directed. (FOR ICD-10 E10.9, E11.9).    [provider]  carvedilol (COREG) 6.25 MG tablet Take 1 tablet (6.25 mg total) by mouth 2 (two) times daily with a meal. 08/24/22   Iran Ouch, MD  Cholecalciferol 25 MCG (1000 UT) tablet Take 2,000 Units by mouth daily.    [provider]  Continuous Blood Gluc Receiver (FREESTYLE LIBRE 2 READER) DEVI 1 each by Does not apply route daily. 05/16/21   Carlus Pavlov, MD  Continuous Glucose Sensor  (FREESTYLE LIBRE 2 SENSOR) MISC 1 each by Does not apply route every 14 (fourteen) days. 02/01/23   Carlus Pavlov, MD  ferrous sulfate 325 (65 FE) MG tablet Take 325 mg by mouth daily with breakfast.    [provider]  Glucagon 3 MG/DOSE POWD Place 3 mg into the nose once as needed for up to 1 dose. 11/03/20   Carlus Pavlov, MD  insulin glargine (LANTUS SOLOSTAR) 100 UNIT/ML Solostar Pen ADMINISTER 14 UNITS UNDER THE SKIN DAILY 03/22/23   Carlus Pavlov, MD  insulin lispro (HUMALOG KWIKPEN) 100 UNIT/ML KwikPen Inject 4-8 Units into the skin 3 (three) times daily. 10/25/22   Carlus Pavlov, MD  Insulin Pen Needle 31G X 6 MM MISC Use 4 times daily 08/11/21   Carlus Pavlov, MD  Multiple Vitamins-Minerals (MULTIVITAMIN WITH MINERALS) tablet Take 1 tablet by mouth daily.    [provider]  Multiple Vitamins-Minerals (OCUVITE ADULT 50+ PO) Take 1 tablet by mouth daily.    [provider]  omeprazole (PRILOSEC) 40 MG capsule Take 1 capsule (40 mg total) by mouth 2 (two) times daily before a meal. 03/02/23   Mansouraty, Netty Starring., MD  potassium chloride (KLOR-CON) 10 MEQ tablet Take 10 mEq by mouth daily. 02/27/22   [provider]  sacubitril-valsartan (ENTRESTO) 24-26 MG TAKE 1 TABLET BY MOUTH TWICE DAILY 10/22/22   Iran Ouch, MD  Semaglutide, 1  MG/DOSE, (OZEMPIC, 1 MG/DOSE,) 4 MG/3ML SOPN Inject 1 mg into the skin once a week. 02/22/23   Carlus Pavlov, MD      Allergies    Bee venom, Iodinated contrast media, Crestor [rosuvastatin], and Lipitor [atorvastatin]    Review of Systems   Review of Systems  Constitutional:  Negative for chills and fever.  Respiratory:  Negative for shortness of breath.   Cardiovascular:  Negative for chest pain.  Gastrointestinal:  Negative for abdominal pain.  Musculoskeletal:  Positive for arthralgias, myalgias and neck pain. Negative for back pain and neck stiffness.  Neurological:  Negative for dizziness  and weakness.  All other systems reviewed and are negative.   Physical Exam Updated Vital Signs BP (!) 156/67   Pulse 73   Temp 97.8 F (36.6 C)   Resp 15   SpO2 95%  Physical Exam Vitals and nursing note reviewed.  Constitutional:      Appearance: Normal appearance.  HENT:     Head: Normocephalic and atraumatic.     Mouth/Throat:     Mouth: Mucous membranes are moist.  Cardiovascular:     Rate and Rhythm: Normal rate.     Pulses: Normal pulses.  Pulmonary:     Effort: Pulmonary effort is normal.     Breath sounds: No wheezing or rales.  Abdominal:     General: Abdomen is flat.  Musculoskeletal:       Arms:     Cervical back: Normal range of motion and neck supple.  Skin:    General: Skin is warm and dry.  Neurological:     Mental Status: She is alert and oriented to person, place, and time.     ED Results / Procedures / Treatments   Labs (all labs ordered are listed, but only abnormal results are displayed) Labs Reviewed  CBC WITH DIFFERENTIAL/PLATELET - Abnormal; Notable for the following components:      Result Value   Hemoglobin 11.5 (*)    HCT 35.1 (*)    All other components within normal limits  COMPREHENSIVE METABOLIC PANEL - Abnormal; Notable for the following components:   Potassium 3.4 (*)    Glucose, Bld 190 (*)    BUN 24 (*)    Creatinine, Ser 2.16 (*)    GFR, Estimated 24 (*)    All other components within normal limits    EKG EKG Interpretation Date/Time:  Wednesday April 10 2023 08:50:37 EDT Ventricular Rate:  75 PR Interval:  222 QRS Duration:  171 QT Interval:  436 QTC Calculation: 487 R Axis:   -68  Text Interpretation: paced, no change Confirmed by Kristine Royal [phone]) on 04/10/2023 8:52:16 AM  Radiology DG Chest Port 1 View  Result Date: 04/10/2023 CLINICAL DATA:  Right neck pain radiating into the upper back EXAM: PORTABLE CHEST 1 VIEW COMPARISON:  Chest radiograph dated 01/15/2020 FINDINGS: Lines/tubes: Left chest wall  pacemaker leads project over the right atrium and ventricle. Lungs: Well inflated lungs.  No focal consolidations. Pleura: No pneumothorax or pleural effusion. Heart/mediastinum: Enlarged cardiomediastinal silhouette is likely projectional. Bones: Partially imaged degenerative changes of the cervical spine. IMPRESSION: 1.  No focal consolidations. 2. Partially imaged degenerative changes of the cervical spine. Electronically Signed   By: Agustin Cree M.D.   On: 04/10/2023 08:30   DG Shoulder Right  Result Date: 04/10/2023 CLINICAL DATA:  Acute right shoulder pain without reported injury. EXAM: RIGHT SHOULDER - 2+ VIEW COMPARISON:  None Available. FINDINGS: There is no evidence of fracture  or dislocation. Severe degenerative changes seen involving right glenohumeral joint. Soft tissues are unremarkable. IMPRESSION: Severe osteoarthritis of right glenohumeral joint. No acute abnormality seen. Electronically Signed   By: Lupita Raider M.D.   On: 04/10/2023 08:09    Procedures Procedures    Medications Ordered in ED Medications  carvedilol (COREG) tablet 6.25 mg (6.25 mg Oral Given 04/10/23 0831)  HYDROcodone-acetaminophen (NORCO/VICODIN) 5-325 MG per tablet 1 tablet (1 tablet Oral Given 04/10/23 1610)    ED Course/ Medical Decision Making/ A&P                                 Medical Decision Making Amount and/or Complexity of Data Reviewed Labs: ordered. Radiology: ordered.  Risk Prescription drug management.   Patient here with sudden onset of right shoulder pain which has been ongoing for the past 2 days.  No trauma, no fevers.  Worsening pain with range of motion of the right shoulder reports this radiates all the way to her neck and down her arm.  Has not had any headache, no chest pain, no shortness of breath.  On exam there is significant tenderness to palpation along her trapezius, has overall good range of motion with pulses symmetric bilaterally. X-ray of her right shoulder that  shows severe arthritis.  Labs were obtained for further evaluation.  CBC with no leukocytosis, hemoglobin is her baseline.  CMP with no electrolyte derangement aside from some slight decrease in her potassium.  Creatinine level is elevated however consistent with prior visits.  X-ray of her chest did not show any pneumonia.  Pain is reproducible with palpation along her trapezius and worsened with lifting of her arm.  She was somewhat hypertensive on arrival however after receiving pain control, and her blood pressure medication this has trended down.    Discussed results of x-ray with patient at length, we discussed pain control at home along with appropriate follow-up with orthopedics. She will go home on a short course of medication for pain control. Risk and benefits discussed. Patient stable for discharge.   Portions of this note were generated with Scientist, clinical (histocompatibility and immunogenetics). Dictation errors may occur despite best attempts at proofreading.   Final Clinical Impression(s) / ED Diagnoses Final diagnoses:  Acute pain of right shoulder    Rx / DC Orders ED Discharge Orders          Ordered    HYDROcodone-acetaminophen (NORCO/VICODIN) 5-325 MG tablet  Every 4 hours PRN        04/10/23 0930              Claude Manges, PA-C 04/10/23 0931    Wynetta Fines, MD 04/10/23 [phone]

## 2023-04-10 NOTE — Discharge Instructions (Addendum)
The xray of your right shoulder showed severe arthritis, please follow up with an orthopedist to further address this problem.  You were given medication to help with severe pain, please take this medication sporadically, be aware this medication can cause constipation therefore adding a stool softener will help. DO NOT drive while taking this medication.

## 2023-04-18 ENCOUNTER — Other Ambulatory Visit: Payer: Self-pay | Admitting: Cardiovascular Disease

## 2023-04-18 DIAGNOSIS — I5022 Chronic systolic (congestive) heart failure: Secondary | ICD-10-CM

## 2023-04-18 NOTE — Telephone Encounter (Signed)
Good Morning,  Could you please schedule this patient a 6 month follow up visit? The patient was last seen by Dr. Kirke Corin on 08-24-22. Thank you so much.

## 2023-04-19 ENCOUNTER — Encounter: Payer: Self-pay | Admitting: Gastroenterology

## 2023-04-19 ENCOUNTER — Telehealth: Payer: Self-pay

## 2023-04-19 ENCOUNTER — Ambulatory Visit (AMBULATORY_SURGERY_CENTER): Payer: 59 | Admitting: Gastroenterology

## 2023-04-19 VITALS — BP 172/73 | HR 66 | Temp 98.0°F | Resp 10 | Ht 62.0 in | Wt 137.0 lb

## 2023-04-19 DIAGNOSIS — K200 Eosinophilic esophagitis: Secondary | ICD-10-CM | POA: Diagnosis not present

## 2023-04-19 DIAGNOSIS — R6881 Early satiety: Secondary | ICD-10-CM

## 2023-04-19 DIAGNOSIS — R1030 Lower abdominal pain, unspecified: Secondary | ICD-10-CM

## 2023-04-19 DIAGNOSIS — K3189 Other diseases of stomach and duodenum: Secondary | ICD-10-CM

## 2023-04-19 DIAGNOSIS — D490 Neoplasm of unspecified behavior of digestive system: Secondary | ICD-10-CM

## 2023-04-19 DIAGNOSIS — K2100 Gastro-esophageal reflux disease with esophagitis, without bleeding: Secondary | ICD-10-CM

## 2023-04-19 MED ORDER — SUCRALFATE 1 G PO TABS: 1.0000 g | ORAL_TABLET | Freq: Two times a day (BID) | ORAL | 1 refills | Status: DC

## 2023-04-19 MED ORDER — SODIUM CHLORIDE 0.9 % IV SOLN: 500.0000 mL | Freq: Once | INTRAVENOUS | Status: DC

## 2023-04-19 MED ORDER — ENTRESTO 24-26 MG PO TABS
1.0000 | ORAL_TABLET | Freq: Two times a day (BID) | ORAL | 4 refills | Status: DC
Start: 2023-04-19 — End: 2023-10-31

## 2023-04-19 MED ORDER — OMEPRAZOLE 40 MG PO CPDR: 40.0000 mg | DELAYED_RELEASE_CAPSULE | Freq: Two times a day (BID) | ORAL | 6 refills | Status: DC

## 2023-04-19 NOTE — Telephone Encounter (Signed)
Patient got a message from her pharmacy that said she could not get a medication because it was filled recently. After calling her she realized that she did have Entresto filled last week and that was probably what they were talking about. She will pick up the other 2 later today. No further questions.

## 2023-04-19 NOTE — Patient Instructions (Signed)
Start Omeprazole 2 x per day Start Carafe  YOU HAD AN ENDOSCOPIC PROCEDURE TODAY: Refer to the procedure report and other information in the discharge instructions given to you for any specific questions about what was found during the examination. If this information does not answer your questions, please call [place] office at [phone] to clarify.   YOU SHOULD EXPECT: Some feelings of bloating in the abdomen. Passage of more gas than usual. Walking can help get rid of the air that was put into your GI tract during the procedure and reduce the bloating. If you had a lower endoscopy (such as a colonoscopy or flexible sigmoidoscopy) you may notice spotting of blood in your stool or on the toilet paper. Some abdominal soreness may be present for a day or two, also.  DIET: Your first meal following the procedure should be a light meal and then it is ok to progress to your normal diet. A half-sandwich or bowl of soup is an example of a good first meal. Heavy or fried foods are harder to digest and may make you feel nauseous or bloated. Drink plenty of fluids but you should avoid alcoholic beverages for 24 hours. If you had a esophageal dilation, please see attached instructions for diet.    ACTIVITY: Your care partner should take you home directly after the procedure. You should plan to take it easy, moving slowly for the rest of the day. You can resume normal activity the day after the procedure however YOU SHOULD NOT DRIVE, use power tools, machinery or perform tasks that involve climbing or major physical exertion for 24 hours (because of the sedation medicines used during the test).   SYMPTOMS TO REPORT IMMEDIATELY: A gastroenterologist can be reached at any hour. Please call [phone]  for any of the following symptoms:  Following upper endoscopy (EGD, EUS, ERCP, esophageal dilation) Vomiting of blood or coffee ground material  New, significant abdominal pain  New, significant chest pain or  pain under the shoulder blades  Painful or persistently difficult swallowing  New shortness of breath  Black, tarry-looking or red, bloody stools  FOLLOW UP:  If any biopsies were taken you will be contacted by phone or by letter within the next 1-3 weeks. Call [phone]  if you have not heard about the biopsies in 3 weeks.  Please also call with any specific questions about appointments or follow up tests.

## 2023-04-19 NOTE — Telephone Encounter (Signed)
Inbound call from patients states she is unable to receive prescriptions  at pharmacy. Please advise.

## 2023-04-19 NOTE — Progress Notes (Unsigned)
Retained food.  Initial dose only given

## 2023-04-19 NOTE — Progress Notes (Signed)
Called to room to assist during endoscopic procedure.  Patient ID and intended procedure confirmed with present staff. Received instructions for my participation in the procedure from the performing physician.  

## 2023-04-19 NOTE — Progress Notes (Unsigned)
Report to PACU, RN, vss, BBS= Clear.  

## 2023-04-19 NOTE — Telephone Encounter (Signed)
GES has been ordered and sent to the schedulers

## 2023-04-19 NOTE — Telephone Encounter (Signed)
-----   Message from [facility] sent at 04/19/2023 10:55 AM EDT ----- Regarding: Emtpying Study Bethany Gillespie, This patient needs a solid food gastric emptying study for evaluation of early satiety and significant amount of foodstuffs on her endoscopy today.  Please order. Thanks. GM

## 2023-04-19 NOTE — Op Note (Signed)
[facility] Patient Name: Bethany Gillespie Procedure Date: 04/19/2023 10:13 AM MRN: [id number] Endoscopist: Corliss Parish , MD, [id number] Age: 72 Referring MD:  Date of Birth: 1950-12-30 Gender: Female Account #: [id number] Procedure:                Upper GI endoscopy Indications:              Follow-up of esophagitis, Change in bowel habits Medicines:                Monitored Anesthesia Care Procedure:                Pre-Anesthesia Assessment:                           - Prior to the procedure, a History and Physical                            was performed, and patient medications and                            allergies were reviewed. The patient's tolerance of                            previous anesthesia was also reviewed. The risks                            and benefits of the procedure and the sedation                            options and risks were discussed with the patient.                            All questions were answered, and informed consent                            was obtained. Prior Anticoagulants: The patient has                            taken no anticoagulant or antiplatelet agents. ASA                            Grade Assessment: III - A patient with severe                            systemic disease. After reviewing the risks and                            benefits, the patient was deemed in satisfactory                            condition to undergo the procedure.                           After obtaining informed consent, the endoscope was  passed under direct vision. Throughout the                            procedure, the patient's blood pressure, pulse, and                            oxygen saturations were monitored continuously. The                            GIF [id number] #[id number] was introduced through the                            mouth, and advanced to the second part of duodenum.                             The upper GI endoscopy was accomplished without                            difficulty. The patient tolerated the procedure. Scope In: Scope Out: Findings:                 No gross lesions were noted in the proximal                            esophagus and in the mid esophagus.                           LA Grade C (one or more mucosal breaks continuous                            between tops of 2 or more mucosal folds, less than                            75% circumference) esophagitis with no bleeding was                            found in the distal esophagus. Biopsies were taken                            with a cold forceps for histology.                           The Z-line was irregular and was found 36 cm from                            the incisors.                           A 3 cm hiatal hernia was present.                           A large amount of food (residue) was found in the  entire examined stomach. This precluded                            visualization of the stomach lining.                           No gross lesions were noted in the duodenal bulb,                            in the first portion of the duodenum and in the                            second portion of the duodenum. Biopsies were taken                            with a cold forceps for histology. Complications:            No immediate complications. Estimated Blood Loss:     Estimated blood loss was minimal. Impression:               - No gross lesions in the proximal esophagus and in                            the mid esophagus.                           - LA Grade C erosive esophagitis with no bleeding                            found distally. Biopsied.                           - Z-line irregular, 36 cm from the incisors.                           - 3 cm hiatal hernia.                           - A large amount of food (residue) in the stomach.                             This precluded visualization of the stomach lining.                           - No gross lesions in the duodenal bulb, in the                            first portion of the duodenum and in the second                            portion of the duodenum. Biopsied. Recommendation:           - The patient will be observed post-procedure,  until all discharge criteria are met.                           - Discharge patient to home.                           - Patient has a contact number available for                            emergencies. The signs and symptoms of potential                            delayed complications were discussed with the                            patient. Return to normal activities tomorrow.                            Written discharge instructions were provided to the                            patient.                           - Resume previous diet.                           - Patient should restart PPI 40 mg twice daily.                           - Carafate 1 g twice daily (crushed as tablet and                            dissolve in 15 mL of fluid).                           - Await pathology results.                           - Repeat endoscopy in 3 to 4 months (pending                            pathology did not show evidence of dysplastic                            changes) to evaluate and ensure healing of the                            esophagitis. If esophagitis persist, will need to                            consider the role of hiatal hernia repair.                           - Early satiety has not  been an issue for the                            patient but will read discussed with her because if                            she has underlying gastroparesis that could explain                            some reason for the food stasis.                           - The findings and recommendations were discussed                             with the patient.                           - The findings and recommendations were discussed                            with the patient's family. Corliss Parish, MD 04/19/2023 10:31:23 AM

## 2023-04-19 NOTE — Progress Notes (Unsigned)
GASTROENTEROLOGY PROCEDURE H&P NOTE   Primary Care Physician: Loura Back, NP  HPI: Bethany Gillespie is a 72 y.o. female who presents for EGD for followup esophagitis and changes in bowel habits.  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 s/p Field seismologist PPM in 03/2015   Bell's palsy    Bradycardia    CAD (coronary artery disease)    a. 02/2016 Cath: LM nl, LAD 30p, D1 small w/ severe ostial/mid dzs, LCX nl, RCA nl, EF <25%, glob HK.   Chronic combined systolic and diastolic CHF (congestive heart failure) (HCC)    a. Echo in 03/2015: EF 55-60%, Grade 1 DD;  b. 02/2016 Echo: 25-30%, mod LVH, Gr1 DD, mild MR, mildly dil LA, prominent apical trabeculations.   Diabetes (HCC)    Hernia of abdominal wall    HTN (hypertension)    Hypertension    NICM (nonischemic cardiomyopathy) (HCC)    a. 02/2016 relatively non-obstructive cath, EF 25-30% by echo.   Past Surgical History:  Procedure Laterality Date   BIOPSY  10/25/2022   Procedure: BIOPSY;  Surgeon: Meridee Score Netty Starring., MD;  Location: [facility];  Service: Gastroenterology;;   CARDIAC CATHETERIZATION Bilateral 03/01/2016   Procedure: Left Heart Cath and Coronary Angiography;  Surgeon: Antonieta Iba, MD;  Location: [facility];  Service: Cardiovascular;  Laterality: Bilateral;   COLONOSCOPY WITH PROPOFOL N/A 05/06/2019   Procedure: COLONOSCOPY WITH PROPOFOL;  Surgeon: Toledo, Boykin Nearing, MD;  Location: [facility];  Service: Gastroenterology;  Laterality: N/A;   ESOPHAGOGASTRODUODENOSCOPY (EGD) WITH PROPOFOL N/A 05/06/2019   Procedure: ESOPHAGOGASTRODUODENOSCOPY (EGD) WITH PROPOFOL;  Surgeon: Toledo, Boykin Nearing, MD;  Location: [facility];  Service: Gastroenterology;  Laterality: N/A;   ESOPHAGOGASTRODUODENOSCOPY (EGD) WITH PROPOFOL N/A 10/25/2022   Procedure: ESOPHAGOGASTRODUODENOSCOPY (EGD) WITH PROPOFOL;  Surgeon: Meridee Score Netty Starring., MD;  Location: [facility];   Service: Gastroenterology;  Laterality: N/A;   EUS N/A 10/25/2022   Procedure: UPPER ENDOSCOPIC ULTRASOUND (EUS) RADIAL;  Surgeon: Lemar Lofty., MD;  Location: [facility];  Service: Gastroenterology;  Laterality: N/A;   FINE NEEDLE ASPIRATION N/A 10/25/2022   Procedure: FINE NEEDLE ASPIRATION (FNA) LINEAR;  Surgeon: Lemar Lofty., MD;  Location: [facility];  Service: Gastroenterology;  Laterality: N/A;   HEMOSTASIS CLIP PLACEMENT  10/25/2022   Procedure: HEMOSTASIS CLIP PLACEMENT;  Surgeon: Meridee Score Netty Starring., MD;  Location: [facility];  Service: Gastroenterology;;   HERNIA REPAIR     INSERT / REPLACE / REMOVE PACEMAKER     PACEMAKER INSERTION     PACEMAKER INSERTION     Current Outpatient Medications  Medication Sig Dispense Refill   blood glucose meter kit and supplies by Other route as directed. Contour next Meter. Dispense based on patient and insurance preference. Use up to four times daily as directed. (FOR ICD-10 E10.9, E11.9).     carvedilol (COREG) 6.25 MG tablet Take 1 tablet (6.25 mg total) by mouth 2 (two) times daily with a meal. 180 tablet 1   Cholecalciferol 25 MCG (1000 UT) tablet Take 2,000 Units by mouth daily.     Continuous Blood Gluc Receiver (FREESTYLE LIBRE 2 READER) DEVI 1 each by Does not apply route daily. 1 each 0   Continuous Glucose Sensor (FREESTYLE LIBRE 2 SENSOR) MISC 1 each by Does not apply route every 14 (fourteen) days. 1 each 0   ferrous sulfate 325 (65 FE) MG tablet Take 325 mg by mouth daily with breakfast.  Glucagon 3 MG/DOSE POWD Place 3 mg into the nose once as needed for up to 1 dose. 1 each 11   insulin glargine (LANTUS SOLOSTAR) 100 UNIT/ML Solostar Pen ADMINISTER 14 UNITS UNDER THE SKIN DAILY 15 mL 1   insulin lispro (HUMALOG KWIKPEN) 100 UNIT/ML KwikPen Inject 4-8 Units into the skin 3 (three) times daily. 15 mL 3   Insulin Pen Needle 31G X 6 MM MISC Use 4 times daily 150 each 3   Multiple Vitamins-Minerals  (MULTIVITAMIN WITH MINERALS) tablet Take 1 tablet by mouth daily.     Multiple Vitamins-Minerals (OCUVITE ADULT 50+ PO) Take 1 tablet by mouth daily.     omeprazole (PRILOSEC) 40 MG capsule Take 1 capsule (40 mg total) by mouth 2 (two) times daily before a meal. 60 capsule 6   potassium chloride (KLOR-CON) 10 MEQ tablet Take 10 mEq by mouth daily.     sacubitril-valsartan (ENTRESTO) 24-26 MG TAKE 1 TABLET BY MOUTH TWICE DAILY 60 tablet 4   Semaglutide, 1 MG/DOSE, (OZEMPIC, 1 MG/DOSE,) 4 MG/3ML SOPN Inject 1 mg into the skin once a week. 9 mL 3   Current Facility-Administered Medications  Medication Dose Route Frequency Provider Last Rate Last Admin   0.9 %  sodium chloride infusion  500 mL Intravenous Once Mansouraty, Netty Starring., MD        Current Outpatient Medications:    blood glucose meter kit and supplies, by Other route as directed. Contour next Meter. Dispense based on patient and insurance preference. Use up to four times daily as directed. (FOR ICD-10 E10.9, E11.9)., Disp: , Rfl:    carvedilol (COREG) 6.25 MG tablet, Take 1 tablet (6.25 mg total) by mouth 2 (two) times daily with a meal., Disp: 180 tablet, Rfl: 1   Cholecalciferol 25 MCG (1000 UT) tablet, Take 2,000 Units by mouth daily., Disp: , Rfl:    Continuous Blood Gluc Receiver (FREESTYLE LIBRE 2 READER) DEVI, 1 each by Does not apply route daily., Disp: 1 each, Rfl: 0   Continuous Glucose Sensor (FREESTYLE LIBRE 2 SENSOR) MISC, 1 each by Does not apply route every 14 (fourteen) days., Disp: 1 each, Rfl: 0   ferrous sulfate 325 (65 FE) MG tablet, Take 325 mg by mouth daily with breakfast., Disp: , Rfl:    Glucagon 3 MG/DOSE POWD, Place 3 mg into the nose once as needed for up to 1 dose., Disp: 1 each, Rfl: 11   insulin glargine (LANTUS SOLOSTAR) 100 UNIT/ML Solostar Pen, ADMINISTER 14 UNITS UNDER THE SKIN DAILY, Disp: 15 mL, Rfl: 1   insulin lispro (HUMALOG KWIKPEN) 100 UNIT/ML KwikPen, Inject 4-8 Units into the skin 3 (three)  times daily., Disp: 15 mL, Rfl: 3   Insulin Pen Needle 31G X 6 MM MISC, Use 4 times daily, Disp: 150 each, Rfl: 3   Multiple Vitamins-Minerals (MULTIVITAMIN WITH MINERALS) tablet, Take 1 tablet by mouth daily., Disp: , Rfl:    Multiple Vitamins-Minerals (OCUVITE ADULT 50+ PO), Take 1 tablet by mouth daily., Disp: , Rfl:    omeprazole (PRILOSEC) 40 MG capsule, Take 1 capsule (40 mg total) by mouth 2 (two) times daily before a meal., Disp: 60 capsule, Rfl: 6   potassium chloride (KLOR-CON) 10 MEQ tablet, Take 10 mEq by mouth daily., Disp: , Rfl:    sacubitril-valsartan (ENTRESTO) 24-26 MG, TAKE 1 TABLET BY MOUTH TWICE DAILY, Disp: 60 tablet, Rfl: 4   Semaglutide, 1 MG/DOSE, (OZEMPIC, 1 MG/DOSE,) 4 MG/3ML SOPN, Inject 1 mg into the skin once a  week., Disp: 9 mL, Rfl: 3  Current Facility-Administered Medications:    0.9 %  sodium chloride infusion, 500 mL, Intravenous, Once, Mansouraty, Netty Starring., MD Allergies  Allergen Reactions   Bee Venom Anaphylaxis   Iodinated Contrast Media Shortness Of Breath    Eye pain    Crestor [Rosuvastatin] Other (See Comments)    Leg cramps   Lipitor [Atorvastatin] Other (See Comments)    Severe leg cramping   Family History  Problem Relation Age of Onset   Skin cancer Mother    Stroke Father    Diabetes Brother    Brain cancer Brother    Breast cancer Daughter 72   Diabetes Mellitus I Grandchild    Colon cancer Neg Hx    Esophageal cancer Neg Hx    Inflammatory bowel disease Neg Hx    Liver disease Neg Hx    Pancreatic cancer Neg Hx    Rectal cancer Neg Hx    Stomach cancer Neg Hx    Social History   Socioeconomic History   Marital status: Widowed    Spouse name: Not on file   Number of children: 5   Years of education: Not on file   Highest education level: Not on file  Occupational History   Not on file  Tobacco Use   Smoking status: Never   Smokeless tobacco: Never  Vaping Use   Vaping status: Never Used  Substance and Sexual  Activity   Alcohol use: Not Currently   Drug use: No   Sexual activity: Not on file  Other Topics Concern   Not on file  Social History Narrative   Not on file   Social Determinants of Health   Financial Resource Strain: Not on file  Food Insecurity: Not on file  Transportation Needs: Not on file  Physical Activity: Not on file  Stress: Not on file  Social Connections: Not on file  Intimate Partner Violence: Not on file    Physical Exam: Today's Vitals   04/19/23 0935  BP: (!) 152/71  Pulse: 72  Temp: 98 F (36.7 C)  TempSrc: Temporal  SpO2: 98%  Weight: 137 lb (62.1 kg)  Height: 5\' 2"  (1.575 m)   Body mass index is 25.06 kg/m. GEN: NAD EYE: Sclerae anicteric ENT: MMM CV: Non-tachycardic GI: Soft, NT/ND NEURO:  Alert & Oriented x 3  Lab Results: No results for input(s): "WBC", "HGB", "HCT", "PLT" in the last 72 hours. BMET No results for input(s): "NA", "K", "CL", "CO2", "GLUCOSE", "BUN", "CREATININE", "CALCIUM" in the last 72 hours. LFT No results for input(s): "PROT", "ALBUMIN", "AST", "ALT", "ALKPHOS", "BILITOT", "BILIDIR", "IBILI" in the last 72 hours. PT/INR No results for input(s): "LABPROT", "INR" in the last 72 hours.   Impression / Plan: This is a 72 y.o.female who presents for EGD for followup esophagitis and changes in bowel habits.   The risks and benefits of endoscopic evaluation/treatment were discussed with the patient and/or family; these include but are not limited to the risk of perforation, infection, bleeding, missed lesions, lack of diagnosis, severe illness requiring hospitalization, as well as anesthesia and sedation related illnesses.  The patient's history has been reviewed, patient examined, no change in status, and deemed stable for procedure.  The patient and/or family is agreeable to proceed.    Corliss Parish, MD [facility] Office # [phone]

## 2023-04-19 NOTE — Progress Notes (Unsigned)
Pt's states no medical or surgical changes since previsit or office visit.   Pt took last Ozempic injection on 04/14/23.  Ok to do procedure per Ivin Booty Monday, CRNA.

## 2023-04-22 ENCOUNTER — Telehealth: Payer: Self-pay

## 2023-04-22 NOTE — Telephone Encounter (Signed)
  Follow up Call-     04/19/2023    9:38 AM  Call back number  Post procedure Call Back phone  # [phone]  Permission to leave phone message Yes     Patient questions:  Do you have a fever, pain , or abdominal swelling? No. Pain Score  0 *  Have you tolerated food without any problems? Yes.    Have you been able to return to your normal activities? Yes.    Do you have any questions about your discharge instructions: Diet   No. Medications  No. Follow up visit  No.  Do you have questions or concerns about your Care? No.  Actions: * If pain score is 4 or above: No action needed, pain <4.

## 2023-04-23 ENCOUNTER — Encounter: Payer: Self-pay | Admitting: Gastroenterology

## 2023-04-23 LAB — SURGICAL PATHOLOGY

## 2023-05-10 ENCOUNTER — Ambulatory Visit (INDEPENDENT_AMBULATORY_CARE_PROVIDER_SITE_OTHER): Payer: 59

## 2023-05-10 DIAGNOSIS — I441 Atrioventricular block, second degree: Secondary | ICD-10-CM

## 2023-05-13 ENCOUNTER — Encounter (HOSPITAL_COMMUNITY)
Admission: RE | Admit: 2023-05-13 | Discharge: 2023-05-13 | Disposition: A | Discharge status: Home / Self Care | Payer: 59 | Source: Ambulatory Visit | Attending: Gastroenterology | Admitting: Gastroenterology

## 2023-05-13 DIAGNOSIS — R1030 Lower abdominal pain, unspecified: Secondary | ICD-10-CM | POA: Insufficient documentation

## 2023-05-13 DIAGNOSIS — R6881 Early satiety: Secondary | ICD-10-CM | POA: Diagnosis present

## 2023-05-13 DIAGNOSIS — K3189 Other diseases of stomach and duodenum: Secondary | ICD-10-CM | POA: Insufficient documentation

## 2023-05-13 MED ORDER — TECHNETIUM TC 99M SULFUR COLLOID: 2.0900 | Freq: Once | INTRAVENOUS | Status: DC | PRN

## 2023-05-14 ENCOUNTER — Other Ambulatory Visit: Payer: Self-pay

## 2023-05-14 DIAGNOSIS — R1030 Lower abdominal pain, unspecified: Secondary | ICD-10-CM

## 2023-05-14 DIAGNOSIS — R197 Diarrhea, unspecified: Secondary | ICD-10-CM

## 2023-05-14 DIAGNOSIS — K3189 Other diseases of stomach and duodenum: Secondary | ICD-10-CM

## 2023-05-14 DIAGNOSIS — R6881 Early satiety: Secondary | ICD-10-CM

## 2023-05-15 LAB — CUP PACEART REMOTE DEVICE CHECK
Battery Remaining Longevity: 48 mo
Battery Remaining Percentage: 48 %
Brady Statistic RA Percent Paced: 1 %
Brady Statistic RV Percent Paced: 100 %
Date Time Interrogation Session: 20241001173800
Implantable Lead Connection Status: 753985
Implantable Lead Connection Status: 753985
Implantable Lead Implant Date: 20160810
Implantable Lead Implant Date: 20160810
Implantable Lead Location: 753859
Implantable Lead Location: 753860
Implantable Lead Model: 7740
Implantable Lead Model: 7741
Implantable Lead Serial Number: 640659
Implantable Lead Serial Number: 682646
Implantable Pulse Generator Implant Date: 20160810
Lead Channel Impedance Value: 592 Ohm
Lead Channel Impedance Value: 764 Ohm
Lead Channel Setting Pacing Amplitude: 2 V
Lead Channel Setting Pacing Amplitude: 2.4 V
Lead Channel Setting Pacing Pulse Width: 0.5 ms
Lead Channel Setting Sensing Sensitivity: 2.5 mV
Pulse Gen Serial Number: 722311
Zone Setting Status: 755011

## 2023-05-16 NOTE — Progress Notes (Signed)
Remote pacemaker transmission.   

## 2023-06-27 ENCOUNTER — Encounter: Payer: Self-pay | Admitting: Internal Medicine

## 2023-06-27 ENCOUNTER — Ambulatory Visit: Payer: 59 | Admitting: Internal Medicine

## 2023-06-27 VITALS — BP 130/70 | HR 71 | Ht 62.0 in | Wt 145.8 lb

## 2023-06-27 DIAGNOSIS — E785 Hyperlipidemia, unspecified: Secondary | ICD-10-CM

## 2023-06-27 DIAGNOSIS — Z794 Long term (current) use of insulin: Secondary | ICD-10-CM | POA: Diagnosis not present

## 2023-06-27 DIAGNOSIS — E1159 Type 2 diabetes mellitus with other circulatory complications: Secondary | ICD-10-CM

## 2023-06-27 DIAGNOSIS — E1165 Type 2 diabetes mellitus with hyperglycemia: Secondary | ICD-10-CM

## 2023-06-27 DIAGNOSIS — Z7985 Long-term (current) use of injectable non-insulin antidiabetic drugs: Secondary | ICD-10-CM

## 2023-06-27 LAB — POCT GLYCOSYLATED HEMOGLOBIN (HGB A1C): Hemoglobin A1C: 7.8 % — AB (ref 4.0–5.6)

## 2023-06-27 MED ORDER — OZEMPIC (1 MG/DOSE) 4 MG/3ML ~~LOC~~ SOPN: 1.0000 mg | PEN_INJECTOR | SUBCUTANEOUS | 3 refills | Status: AC

## 2023-06-27 MED ORDER — GLUCAGON 3 MG/DOSE NA POWD: 3.0000 mg | Freq: Once | NASAL | 11 refills | Status: AC | PRN

## 2023-06-27 MED ORDER — FREESTYLE LIBRE 2 SENSOR MISC
1.0000 | 3 refills | Status: AC
Start: 2023-06-27 — End: ?

## 2023-06-27 MED ORDER — INSULIN LISPRO (1 UNIT DIAL) 100 UNIT/ML (KWIKPEN): 3.0000 [IU] | PEN_INJECTOR | Freq: Three times a day (TID) | SUBCUTANEOUS | 3 refills | Status: AC

## 2023-06-27 NOTE — Progress Notes (Signed)
Patient ID: Bethany Gillespie, female   DOB: [birth year], 72 y.o.   MRN: [id number]   HPI: Bethany Gillespie is a 72 y.o.-year-old female, initially referred by her previous endocrinologist, Dr. Tedd Sias, returning for follow-up for DM2, dx in 2000s, insulin-dependent  - started several years later, uncontrolled, with complications (CAD, nonischemic CHF, diabetic retinopathy, peripheral neuropathy, CKD stage IV with microalbuminuria).  She was previously seeing Dr. Tedd Sias but cannot drive to her office anymore due to her worsening vision she had to establish care with an endocrinologist in town.  Our last visit was 4 months ago.  Interim hx: No increased urination, nausea, chest pain.  He had blurry vision at last visit, but now improved. She is still having a difficult situation at home, after the death of one of her 2 daughters, she now has custody of her children.  Reviewed HbA1c levels: Lab Results  Component Value Date   HGBA1C 7.0 02/22/2023   HGBA1C 6.8 (A) 10/23/2022   HGBA1C 6.7 (A) 06/01/2022   HGBA1C 7.1 (A) 12/15/2021   HGBA1C 6.8 (A) 08/17/2021   HGBA1C 9.7 (A) 05/16/2021   HGBA1C 9.4 (A) 02/10/2021   HGBA1C 8.9 (A) 11/03/2020   HGBA1C 12.9 (H) 02/28/2016   HGBA1C 12.0 (H) 02/27/2016  12/2019: HbA1c 8.9%  Pt is on a regimen of: - Ozempic 0.25 >> 0.5 mg weekly - added 02/2021 >> off >> restarted 1 mg weekly - Lantus 40 >> 35 >> 25 >> 20 >> 17 >> 14 units at bedtime >> am - Humalog 12 >> 8-12 >> 4-8 units before meal >> 4-8 >> 2-4 >> 1-2 >> increased this by herself to 2-6 >> 2-4 units for correction only Prev. On Metformin and Actos.  Pt checks her sugars more than 4 times a day with her freestyle libre CGM:  Prev.:   Previously:  Lowest sugar was LO >> 30s >> 45 >> 50; she has hypoglycemia awareness at 60s.  Highest sugar was 300 (gatorade) >> 200s >> 320 (chocolate banana) >> 350.  Pt's meals are: - Breakfast: oatmeal + berries, yoghurt >> skips 3x a week - Lunch: salad or  cheese + crackers, soup - Dinner: meat + veggies or starch - largest meal - Snacks: 3: nuts, popcorn  - + CKD with microalbuminuria - Dr. Thedore Mins, last BUN/creatinine:  Lab Results  Component Value Date   BUN 24 (H) 04/10/2023   BUN 24 (H) 10/10/2022   CREATININE 2.16 (H) 04/10/2023   CREATININE 1.57 (H) 10/10/2022  05/16/2019: 39/217, GFR 23, glucose 161, protein to creatinine ratio 0.156 (0.024-0.184) 09/14/2020: 20/1.9, GFR 26, glucose 174 05/24/2020: 25/2.1 glucose 118 She is on Entresto twice a day.  - + HL; last set of lipids: Lab Results  Component Value Date   CHOL 251 (H) 10/23/2022   HDL 68.00 10/23/2022   LDLCALC 159 (H) 10/23/2022   TRIG 117.0 10/23/2022   CHOLHDL 4 10/23/2022  She was on Crestor 10 mg daily >> off 2/2 severe leg cramps.  Managed by cardiology (Dr. Kirke Corin).   - last eye exam 05/24/2023.  + DR - improved reportedly, + macular degeneration-[facility]. + IO injections. She had Laser surgery.  - resolved numbness and tingling in her feet.  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  Last foot exam 10/23/2022.  Pt has FH of DM in brother and grandson (DM1).  Latest TSH was normal, at 1.074 on 08/29/2018. Lab Results  Component Value Date   TSH 0.49 02/27/2023   She is on  iron sulfate for anemia. She has a history of adrenal adenoma in 04/2018 >> c/w benign adenoma. 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  She had a EUS with biopsy and this was read as statistically indolent mass (molecular alterations but without concerning clinical features), with a 97% probability of benign disease for the next 3 years. No h/o pancreatitis. No FH of MTC.  ROS: + See HPI  Past Medical History:  Diagnosis Date   AV block, Mobitz 2    a. s/p Field seismologist PPM in 03/2015   Bell's palsy    Bradycardia    CAD (coronary artery disease)    a. 02/2016 Cath: LM nl, LAD 30p, D1 small w/ severe ostial/mid  dzs, LCX nl, RCA nl, EF <25%, glob HK.   Chronic combined systolic and diastolic CHF (congestive heart failure) (HCC)    a. Echo in 03/2015: EF 55-60%, Grade 1 DD;  b. 02/2016 Echo: 25-30%, mod LVH, Gr1 DD, mild MR, mildly dil LA, prominent apical trabeculations.   Diabetes (HCC)    Hernia of abdominal wall    HTN (hypertension)    Hypertension    NICM (nonischemic cardiomyopathy) (HCC)    a. 02/2016 relatively non-obstructive cath, EF 25-30% by echo.   Past Surgical History:  Procedure Laterality Date   BIOPSY  10/25/2022   Procedure: BIOPSY;  Surgeon: Meridee Score Netty Starring., MD;  Location: [facility];  Service: Gastroenterology;;   CARDIAC CATHETERIZATION Bilateral 03/01/2016   Procedure: Left Heart Cath and Coronary Angiography;  Surgeon: Antonieta Iba, MD;  Location: [facility];  Service: Cardiovascular;  Laterality: Bilateral;   COLONOSCOPY WITH PROPOFOL N/A 05/06/2019   Procedure: COLONOSCOPY WITH PROPOFOL;  Surgeon: Toledo, Boykin Nearing, MD;  Location: [facility];  Service: Gastroenterology;  Laterality: N/A;   ESOPHAGOGASTRODUODENOSCOPY (EGD) WITH PROPOFOL N/A 05/06/2019   Procedure: ESOPHAGOGASTRODUODENOSCOPY (EGD) WITH PROPOFOL;  Surgeon: Toledo, Boykin Nearing, MD;  Location: [facility];  Service: Gastroenterology;  Laterality: N/A;   ESOPHAGOGASTRODUODENOSCOPY (EGD) WITH PROPOFOL N/A 10/25/2022   Procedure: ESOPHAGOGASTRODUODENOSCOPY (EGD) WITH PROPOFOL;  Surgeon: Meridee Score Netty Starring., MD;  Location: [facility];  Service: Gastroenterology;  Laterality: N/A;   EUS N/A 10/25/2022   Procedure: UPPER ENDOSCOPIC ULTRASOUND (EUS) RADIAL;  Surgeon: Lemar Lofty., MD;  Location: [facility];  Service: Gastroenterology;  Laterality: N/A;   FINE NEEDLE ASPIRATION N/A 10/25/2022   Procedure: FINE NEEDLE ASPIRATION (FNA) LINEAR;  Surgeon: Lemar Lofty., MD;  Location: [facility];  Service: Gastroenterology;  Laterality: N/A;   HEMOSTASIS CLIP PLACEMENT   10/25/2022   Procedure: HEMOSTASIS CLIP PLACEMENT;  Surgeon: Meridee Score Netty Starring., MD;  Location: [facility];  Service: Gastroenterology;;   HERNIA REPAIR     INSERT / REPLACE / REMOVE PACEMAKER     PACEMAKER INSERTION     PACEMAKER INSERTION     Social History   Socioeconomic History   Marital status: Widowed    Spouse name: Not on file   Number of children: 5   Years of education: Not on file   Highest education level: Not on file  Occupational History   Not on file  Tobacco Use   Smoking status: Never   Smokeless tobacco: Never  Vaping Use   Vaping status: Never Used  Substance and Sexual Activity   Alcohol use: Not Currently   Drug use: No   Sexual activity: Not on file  Other Topics Concern   Not on file  Social History  Narrative   Not on file   Social Determinants of Health   Financial Resource Strain: Not on file  Food Insecurity: Not on file  Transportation Needs: Not on file  Physical Activity: Not on file  Stress: Not on file  Social Connections: Not on file  Intimate Partner Violence: Not on file   Current Outpatient Medications on File Prior to Visit  Medication Sig Dispense Refill   amLODipine (NORVASC) 5 MG tablet Oral for 90 Days     blood glucose meter kit and supplies by Other route as directed. Contour next Meter. Dispense based on patient and insurance preference. Use up to four times daily as directed. (FOR ICD-10 E10.9, E11.9).     carvedilol (COREG) 6.25 MG tablet Take 1 tablet (6.25 mg total) by mouth 2 (two) times daily with a meal. 180 tablet 1   Cholecalciferol 25 MCG (1000 UT) tablet Take by mouth.     Continuous Blood Gluc Receiver (FREESTYLE LIBRE 2 READER) DEVI 1 each by Does not apply route daily. 1 each 0   Continuous Glucose Sensor (FREESTYLE LIBRE 2 SENSOR) MISC 1 each by Does not apply route every 14 (fourteen) days. 1 each 0   ferrous sulfate 325 (65 FE) MG tablet Take 325 mg by mouth daily with breakfast.     Glucagon 3  MG/DOSE POWD Place 3 mg into the nose once as needed for up to 1 dose. (Patient not taking: Reported on 04/19/2023) 1 each 11   insulin glargine (LANTUS SOLOSTAR) 100 UNIT/ML Solostar Pen ADMINISTER 14 UNITS UNDER THE SKIN DAILY 15 mL 1   insulin lispro (HUMALOG KWIKPEN) 100 UNIT/ML KwikPen Inject 4-8 Units into the skin 3 (three) times daily. (Patient not taking: Reported on 04/19/2023) 15 mL 3   Insulin Pen Needle 31G X 6 MM MISC Use 4 times daily (Patient not taking: Reported on 04/19/2023) 150 each 3   Multiple Vitamins-Minerals (MULTIVITAMIN WITH MINERALS) tablet Take 1 tablet by mouth daily.     Multiple Vitamins-Minerals (OCUVITE ADULT 50+ PO) Take 1 tablet by mouth daily. (Patient not taking: Reported on 04/19/2023)     omeprazole (PRILOSEC) 40 MG capsule Take 1 capsule (40 mg total) by mouth 2 (two) times daily before a meal. 60 capsule 6   potassium chloride (KLOR-CON) 10 MEQ tablet Take 10 mEq by mouth daily.     sacubitril-valsartan (ENTRESTO) 24-26 MG Take 1 tablet by mouth 2 (two) times daily. 60 tablet 4   Semaglutide, 1 MG/DOSE, (OZEMPIC, 1 MG/DOSE,) 4 MG/3ML SOPN Inject 1 mg into the skin once a week. 9 mL 3   sucralfate (CARAFATE) 1 g tablet Take 1 tablet (1 g total) by mouth 2 (two) times daily. Crush tablet and dissolve in 15 ml of fluid 60 tablet 1   No current facility-administered medications on file prior to visit.   Allergies  Allergen Reactions   Bee Venom Anaphylaxis   Iodinated Contrast Media Shortness Of Breath    Eye pain    Crestor [Rosuvastatin] Other (See Comments)    Leg cramps   Lipitor [Atorvastatin] Other (See Comments)    Severe leg cramping   Family History  Problem Relation Age of Onset   Skin cancer Mother    Stroke Father    Diabetes Brother    Brain cancer Brother    Breast cancer Daughter 78   Diabetes Mellitus I Grandchild    Colon cancer Neg Hx    Esophageal cancer Neg Hx    Inflammatory bowel  disease Neg Hx    Liver disease Neg Hx     Pancreatic cancer Neg Hx    Rectal cancer Neg Hx    Stomach cancer Neg Hx    PE: BP 130/70   Pulse 71   Ht 5\' 2"  (1.575 m)   Wt 145 lb 12.8 oz (66.1 kg)   SpO2 98%   BMI 26.67 kg/m  Wt Readings from Last 3 Encounters:  06/27/23 145 lb 12.8 oz (66.1 kg)  04/19/23 137 lb (62.1 kg)  02/27/23 137 lb 12.8 oz (62.5 kg)   Constitutional: normal weight, in NAD Eyes:  EOMI, no exophthalmos ENT: no neck masses, no cervical lymphadenopathy Cardiovascular: RRR, No MRG Respiratory: CTA B Musculoskeletal: no deformities Skin:no rashes Neurological: no tremor with outstretched hands  ASSESSMENT: 1. DM2, insulin-dependent, uncontrolled, with long-term complications - CAD - nonischemic CHF - diabetic retinopathy - peripheral neuropathy - CKD stage IV with microalbuminuria  No family history of medullary thyroid cancer or personal history of pancreatitis.  2. HL  PLAN:  1. Patient with longstanding, uncontrolled, type 2 diabetes, on basal insulin and weekly GLP-1 receptor agonist, along with rapid acting insulin for correction only.  At last visit, sugars appears to be controlled overnight but they were increasing above 562 after certain meals.  At that time, I suggested to increase the Ozempic dose and discussed about the possible need of injecting 2 to 4 units of Humalog before larger meals. -She has a glucagon kit at home CGM interpretation: -At today's visit, we reviewed her CGM downloads: It appears that 65% of values are in target range (goal >70%), while 34% are higher than 180 (goal <25%), and 1% are lower than 70 (goal <4%).  The calculated average blood sugar is 169.  The projected HbA1c for the next 3 months (GMI) is 7.4%. -Reviewing the CGM trends, sugars are higher than before, less fluctuating in the second half of the night but increasing after every meal.  She also has late postprandial drop in blood sugars, consistent with postprandial correction with Humalog.  At today's  visit we discussed about moving the Humalog doses before the meals to prevent hyperglycemia after the meals.  I advised him to try not to correct blood sugars after the meals, but if the sugars are very high, to use only 2 units.  Will continue the rest of the regimen.  I refilled her prescriptions. - I suggested to:  Patient Instructions  Please continue: - Ozempic 1 mg weekly - Lantus 14 units in am  Change Humalog - 15 min before meals - B'fast 3 units  - Lunch 3 units - Dinner: 4-5 units   If you forget to take Humalog before a meal, try to not correct after the meal. If sugars >240, may need 2 units.  Please return in 3-4 months.   - we checked her HbA1c: 7.8% (higher) - advised to check sugars at different times of the day - 4x a day, rotating check times - advised for yearly eye exams >> she is UTD - return to clinic in 3-4 months  2. HL -Latest lipid panel was reviewed from 10/2022: LDL above target, much worse after coming off Crestor, the rest of the fractions at goal: Lab Results  Component Value Date   CHOL 251 (H) 10/23/2022   HDL 68.00 10/23/2022   LDLCALC 159 (H) 10/23/2022   TRIG 117.0 10/23/2022   CHOLHDL 4 10/23/2022  -Crestor 10 mg daily had to be stopped due to  muscle cramps.  At last visit I suggested pravastatin, which is less likely to cause muscle cramps, however, she did not start this.  Carlus Pavlov, MD PhD [facility]

## 2023-06-27 NOTE — Patient Instructions (Addendum)
Please continue: - Ozempic 1 mg weekly - Lantus 14 units in am  Change Humalog - 15 min before meals - B'fast 3 units  - Lunch 3 units - Dinner: 4-5 units   If you forget to take Humalog before a meal, try to not correct after the meal. If sugars >240, may need 2 units.  Please return in 3-4 months.

## 2023-07-08 NOTE — Progress Notes (Unsigned)
Cardiology Clinic Note   Date: 07/09/2023 ID: Bethany Gillespie, DOB 06-23-1951, MRN [id number]  Primary Cardiologist:  Lorine Bears, MD  Patient Profile    Bethany Gillespie is a 72 y.o. female who presents to the clinic today for routine follow up.     Past medical history significant for: CAD. LHC 03/01/2016: Proximal LAD 30%.  D1 #1 90%, #2 80%. Chronic systolic heart failure. Echo 10/05/2022: EF 45 to 50%.  Global hypokinesis.  Moderate LVH.  Grade II DD.  Abnormal global strain.  Normal RV size/function.  Mildly increased right ventricular wall thickness.  Normal PA pressure.  Mild LAE.  Mild MR.  Mild to moderate TR. 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 Remote device check 05/14/2023: Normal device function.  Battery status good.  Lead measurements unchanged. Carotid artery disease. Carotid duplex 10/05/2022: Bilateral ICA 1 to 39%. Hypertension. Hyperlipidemia. Lipid panel 10/12/2022: LDL 159, HDL 68, TG 117, total 251. T2DM. CKD.   In summary, patient with history of PPM placement performed in [place] in 2016 secondary to AV block. She was diagnosed with chronic systolic heart failure due to nonischemic cardiomyopathy in July 2017 with EF 25-30% and started on GDMT. LHC showed small vessel CAD with no obstructive disease affecting the main arteries. In September 2023, patient was seen in follow up. She was switched from amlodipine to carvedilol. Torsemide had been discontinued by nephrology.      History of Present Illness    Bethany Gillespie is followed by Dr. Graciela Husbands and Dr. Kirke Corin for the above outlined history.   Patient was last seen in the office by Dr. Kirke Corin in 08/24/2022 for routine follow up. She was doing well at that time with no complaints. Carvedilol was increased. She had a carotid doppler secondary to right carotid bruit which showed mild bilateral stenosis. Echo was updated and showed EF 45-50%, grade II DD, normal RV function  and normal PA pressure.   Today, patient is here alone. She is doing well with no complaints. Patient denies shortness of breath, dyspnea on exertion, lower extremity edema, orthopnea or PND. No chest pain, pressure, or tightness. No palpitations. She is very active walking for up to 2 hours in her neighborhood daily. She has not started taking Pravachol as suggested by PCP. She has a history of statin intolerance secondary to muscle cramps. She is willing to give it a try.         ROS: All other systems reviewed and are otherwise negative except as noted in History of Present Illness.  Studies Reviewed    EKG Interpretation Date/Time:  Tuesday July 09 2023 11:30:44 EST Ventricular Rate:  78 PR Interval:  206 QRS Duration:  172 QT Interval:  474 QTC Calculation: 540 R Axis:   -69  Text Interpretation: Atrial-sensed ventricular-paced rhythm When compared with ECG of 10-Apr-2023 08:50, PREVIOUS ECG IS PRESENTNo significant change Confirmed by Carlos Levering [id number]) on 07/09/2023 11:34:20 AM        Physical Exam    VS:  BP 130/70 (BP Location: Left Arm, Patient Position: Sitting, Cuff Size: Normal)   Pulse 78   Ht 5\' 2"  (1.575 m)   Wt 143 lb (64.9 kg)   SpO2 97%   BMI 26.16 kg/m  , BMI Body mass index is 26.16 kg/m.  GEN: Well nourished, well developed, in no acute distress. Neck: No JVD or carotid bruits. Cardiac:  RRR. No murmurs. No rubs or gallops.   Respiratory:  Respirations regular  and unlabored. Clear to auscultation without rales, wheezing or rhonchi. GI: Soft, nontender, nondistended. Extremities: Radials/DP/PT 2+ and equal bilaterally. No clubbing or cyanosis. No edema.  Skin: Warm and dry, no rash. Neuro: Strength intact.  Assessment & Plan    CAD LHC July 2017 showed small vessel CAD without obstructive disease in main arteries. Patient denies chest pain, pressure or tightness. She is very active walking up to 2 hours daily.  -Continue carvedilol.    Chronic systolic heart failure Echo February 2024 showed EF 45-50%, grade II DD, normal RV function, normal PA pressure. Patient denies lower extremity edema, orthopnea or PND. Euvolemic and well compensated on exam.  -Continue Entresto, carvedilol. Not on SGLT2i or spironolactone secondary to CKD.   AV block PPM placed August 2016 in [place]. Remote device check October 2024 showed normal device function with stable battery and lead measurements. No report of lightheadedness, dizziness or syncope. EKG today shows A-sensed V-paced rhythm, 78 bpm.  -Continue to follow with EP.   Hypertension BP today 130/70. -Continue carvedilol and Entresto.    Hyperlipidemia/statin intolerance LDL March 2024 159, not at goal. Patient is willing to try Pravachol. She has failed on atorvastatin and rosuvastatin secondary to leg cramps. She has never been on Zetia. -Pravachol 40 mg daily. -Lipid panel and LFTs in 8-10 weeks.  -If improvement in LDL but not at goal will consider addition of Zetia. If intolerant patient is willing to try PCSK9i.        Disposition: Pravachol 40 mg daily. Lipid panel and LFTs in 8-10 weeks. Return in 6 months or sooner as needed.          Signed, Bethany Gillespie. Anitha Kreiser, DNP, NP-C

## 2023-07-09 ENCOUNTER — Ambulatory Visit: Payer: 59 | Attending: Student | Admitting: Student

## 2023-07-09 ENCOUNTER — Encounter: Payer: Self-pay | Admitting: Student

## 2023-07-09 VITALS — BP 130/70 | HR 78 | Ht 62.0 in | Wt 143.0 lb

## 2023-07-09 DIAGNOSIS — M791 Myalgia, unspecified site: Secondary | ICD-10-CM

## 2023-07-09 DIAGNOSIS — Z950 Presence of cardiac pacemaker: Secondary | ICD-10-CM

## 2023-07-09 DIAGNOSIS — E785 Hyperlipidemia, unspecified: Secondary | ICD-10-CM

## 2023-07-09 DIAGNOSIS — Z79899 Other long term (current) drug therapy: Secondary | ICD-10-CM | POA: Diagnosis not present

## 2023-07-09 DIAGNOSIS — T466X5D Adverse effect of antihyperlipidemic and antiarteriosclerotic drugs, subsequent encounter: Secondary | ICD-10-CM

## 2023-07-09 DIAGNOSIS — I441 Atrioventricular block, second degree: Secondary | ICD-10-CM | POA: Diagnosis not present

## 2023-07-09 DIAGNOSIS — I5022 Chronic systolic (congestive) heart failure: Secondary | ICD-10-CM

## 2023-07-09 DIAGNOSIS — T466X5A Adverse effect of antihyperlipidemic and antiarteriosclerotic drugs, initial encounter: Secondary | ICD-10-CM

## 2023-07-09 DIAGNOSIS — I2510 Atherosclerotic heart disease of native coronary artery without angina pectoris: Secondary | ICD-10-CM

## 2023-07-09 MED ORDER — PRAVASTATIN SODIUM 40 MG PO TABS: 40.0000 mg | ORAL_TABLET | Freq: Every evening | ORAL | 6 refills | Status: DC

## 2023-07-09 NOTE — Patient Instructions (Signed)
Medication Instructions:  - Your physician has recommended you make the following change in your medication:   1) START Pravastatin 40 mg: - take 1 tablet by mouth once daily   *If you need a refill on your cardiac medications before your next appointment, please call your pharmacy*   Lab Work: - Your provider would like for you to return in 8-10 weeks (between January 21- September 17, 2023) to have the following labs drawn: Lipid/ Liver.   Please go to [facility] [address] ([facility] [address], [place] [address] You do not need an appointment.  They are open from 7:30 am-4 pm.  Lunch from 1:00 pm- 2:00 pm You DO need to be fasting (please do not eat or drink anything, except for water/ black coffee for 8 hours prior to your lab draw).    If you have labs (blood work) drawn today and your tests are completely normal, you will receive your results only by: MyChart Message (if you have MyChart) OR A paper copy in the mail If you have any lab test that is abnormal or we need to change your treatment, we will call you to review the results.   Testing/Procedures: - none ordered   Follow-Up: At [facility], you and your health needs are our priority.  As part of our continuing mission to provide you with exceptional heart care, we have created designated Provider Care Teams.  These Care Teams include your primary Cardiologist (physician) and Advanced Practice Providers (APPs -  Physician Assistants and Nurse Practitioners) who all work together to provide you with the care you need, when you need it.  We recommend signing up for the patient portal called "MyChart".  Sign up information is provided on this After Visit Summary.  MyChart is used to connect with patients for Virtual Visits (Telemedicine).  Patients are able to view lab/test results, encounter notes, upcoming appointments, etc.  Non-urgent messages can be sent to your provider as well.   To  learn more about what you can do with MyChart, go to [email or web address].    Your next appointment:   6 month(s)  Provider:   You may see Lorine Bears, MD or one of the following Advanced Practice Providers on your designated Care Team:    Carlos Levering, NP    Other Instructions N/a

## 2023-07-17 ENCOUNTER — Other Ambulatory Visit: Payer: Self-pay | Admitting: Gastroenterology

## 2023-07-22 ENCOUNTER — Encounter: Payer: Self-pay | Admitting: Gastroenterology

## 2023-08-09 ENCOUNTER — Ambulatory Visit (INDEPENDENT_AMBULATORY_CARE_PROVIDER_SITE_OTHER): Payer: 59

## 2023-08-09 DIAGNOSIS — I441 Atrioventricular block, second degree: Secondary | ICD-10-CM | POA: Diagnosis not present

## 2023-08-18 LAB — CUP PACEART REMOTE DEVICE CHECK
Battery Remaining Longevity: 42 mo
Battery Remaining Percentage: 43 %
Brady Statistic RA Percent Paced: 0 %
Brady Statistic RV Percent Paced: 100 %
Date Time Interrogation Session: 20250103110200
Implantable Lead Connection Status: 753985
Implantable Lead Connection Status: 753985
Implantable Lead Implant Date: 20160810
Implantable Lead Implant Date: 20160810
Implantable Lead Location: 753859
Implantable Lead Location: 753860
Implantable Lead Model: 7740
Implantable Lead Model: 7741
Implantable Lead Serial Number: 640659
Implantable Lead Serial Number: 682646
Implantable Pulse Generator Implant Date: 20160810
Lead Channel Impedance Value: 556 Ohm
Lead Channel Impedance Value: 738 Ohm
Lead Channel Setting Pacing Amplitude: 2 V
Lead Channel Setting Pacing Amplitude: 2.4 V
Lead Channel Setting Pacing Pulse Width: 0.5 ms
Lead Channel Setting Sensing Sensitivity: 2.5 mV
Pulse Gen Serial Number: 722311
Zone Setting Status: 755011

## 2023-09-11 ENCOUNTER — Encounter: Payer: Self-pay | Admitting: Internal Medicine

## 2023-09-13 NOTE — Addendum Note (Signed)
Addended by: Elease Etienne A on: 09/13/2023 10:12 AM   Modules accepted: Orders

## 2023-09-13 NOTE — Progress Notes (Signed)
 Remote pacemaker transmission.

## 2023-10-01 ENCOUNTER — Telehealth: Payer: Self-pay | Admitting: Cardiovascular Disease

## 2023-10-01 MED ORDER — CARVEDILOL 6.25 MG PO TABS: 6.2500 mg | ORAL_TABLET | Freq: Two times a day (BID) | ORAL | 1 refills | Status: AC

## 2023-10-01 NOTE — Telephone Encounter (Signed)
*  STAT* If patient is at the pharmacy, call can be transferred to refill team.   1. Which medications need to be refilled? (please list name of each medication and dose if known) carvedilol (COREG) 6.25 MG tablet   2. Which pharmacy/location (including street and city if local pharmacy) is medication to be sent to?   3. Do they need a 30 day or 90 day supply? 90

## 2023-10-01 NOTE — Telephone Encounter (Signed)
 Disp Refills Start End   carvedilol (COREG) 6.25 MG tablet 180 tablet 1 10/01/2023 --   Sig - Route: Take 1 tablet (6.25 mg total) by mouth 2 (two) times daily with a meal. - Oral   Sent to pharmacy as: carvedilol (COREG) 6.25 MG tablet   E-Prescribing Status: Receipt confirmed by pharmacy (10/01/2023  1:56 PM EST)

## 2023-10-01 NOTE — Telephone Encounter (Signed)
 The patient has been given the name of Dr. Arnoldo Hooker, cardiologist.  Refill of carvedilol has been sent in.

## 2023-10-01 NOTE — Telephone Encounter (Signed)
 Patient has moved to [facility], [place] and is requesting call back to discuss possible referrals for her area for a new cardiologist. Please advise.

## 2023-10-31 ENCOUNTER — Other Ambulatory Visit: Payer: Self-pay | Admitting: Cardiovascular Disease

## 2023-10-31 DIAGNOSIS — I5022 Chronic systolic (congestive) heart failure: Secondary | ICD-10-CM

## 2023-11-06 ENCOUNTER — Ambulatory Visit: Payer: 59 | Admitting: Internal Medicine

## 2023-11-08 ENCOUNTER — Ambulatory Visit (INDEPENDENT_AMBULATORY_CARE_PROVIDER_SITE_OTHER): Payer: Medicare Other

## 2023-11-08 DIAGNOSIS — I441 Atrioventricular block, second degree: Secondary | ICD-10-CM

## 2023-11-11 LAB — CUP PACEART REMOTE DEVICE CHECK
Battery Remaining Longevity: 42 mo
Battery Remaining Percentage: 44 %
Brady Statistic RA Percent Paced: 1 %
Brady Statistic RV Percent Paced: 100 %
Date Time Interrogation Session: 20250328023100
Implantable Lead Connection Status: 753985
Implantable Lead Connection Status: 753985
Implantable Lead Implant Date: 20160810
Implantable Lead Implant Date: 20160810
Implantable Lead Location: 753859
Implantable Lead Location: 753860
Implantable Lead Model: 7740
Implantable Lead Model: 7741
Implantable Lead Serial Number: 640659
Implantable Lead Serial Number: 682646
Implantable Pulse Generator Implant Date: 20160810
Lead Channel Impedance Value: 623 Ohm
Lead Channel Impedance Value: 776 Ohm
Lead Channel Setting Pacing Amplitude: 2 V
Lead Channel Setting Pacing Amplitude: 2.4 V
Lead Channel Setting Pacing Pulse Width: 0.5 ms
Lead Channel Setting Sensing Sensitivity: 2.5 mV
Pulse Gen Serial Number: 722311
Zone Setting Status: 755011

## 2023-11-19 ENCOUNTER — Telehealth: Payer: Self-pay

## 2023-11-19 DIAGNOSIS — K8689 Other specified diseases of pancreas: Secondary | ICD-10-CM

## 2023-11-19 DIAGNOSIS — N281 Cyst of kidney, acquired: Secondary | ICD-10-CM

## 2023-11-19 NOTE — Telephone Encounter (Signed)
-----   Message from Nurse Dalaysia Harms P sent at 11/19/2022  9:56 AM EDT ----- 1 year follow-up MRI/MRCP

## 2023-11-19 NOTE — Telephone Encounter (Signed)
 MRCP order entered and sent to the schedulers  Left message on machine to call back

## 2023-11-20 NOTE — Telephone Encounter (Signed)
 The pt has moved to [facility] and will establish care there.

## 2023-11-30 ENCOUNTER — Encounter: Payer: Self-pay | Admitting: Internal Medicine

## 2023-12-08 ENCOUNTER — Other Ambulatory Visit: Payer: Self-pay | Admitting: Gastroenterology

## 2023-12-18 NOTE — Addendum Note (Signed)
 Addended by: Lott Rouleau A on: 12/18/2023 09:17 AM   Modules accepted: Orders

## 2023-12-18 NOTE — Progress Notes (Signed)
 Remote pacemaker transmission.

## 2023-12-23 ENCOUNTER — Other Ambulatory Visit: Payer: Self-pay | Admitting: Student

## 2024-03-10 IMAGING — CT CT HEAD W/O CM
3 series · 16 of 47 positions shown, 19 images · non-contrast
Comparison: None.

CLINICAL DATA: Dizziness, persistent/recurrent, cardiac or vascular
cause suspected



[Series 3: head wo · axial · 0.42mm/px · z∈[-136,+4]mm · 10 of 34 slices shown, 13 images]
[im 3/34  brain]
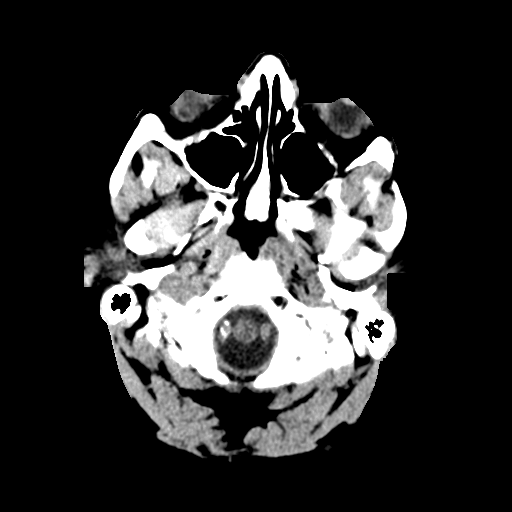
[im 3/34  bone]
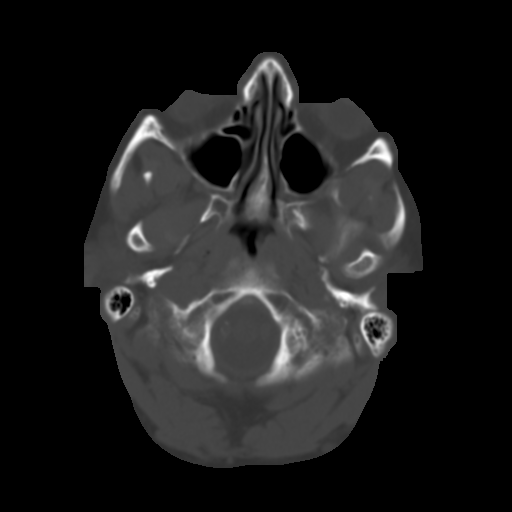
[im 6/34  brain]
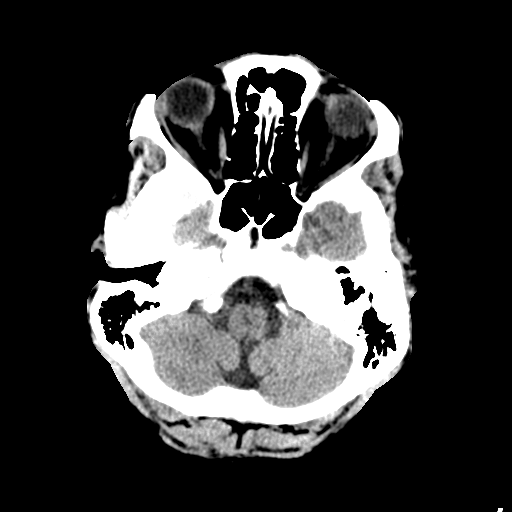
[im 10/34  brain]
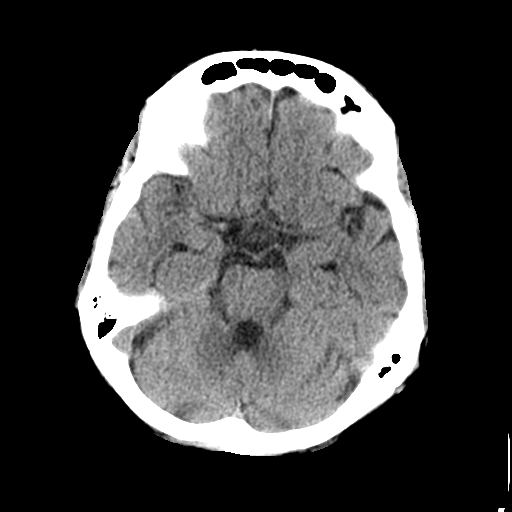
[im 12/34  brain]
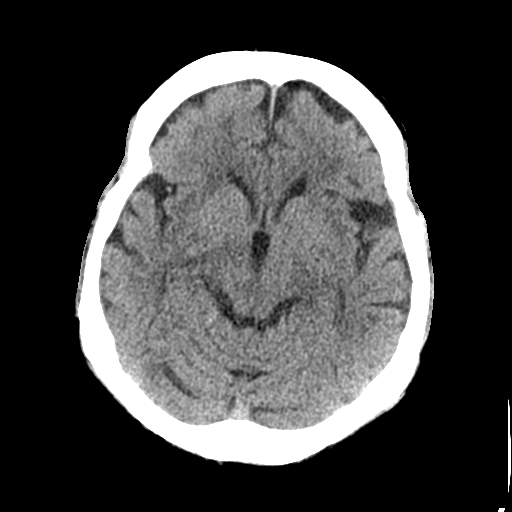
[im 15/34  brain]
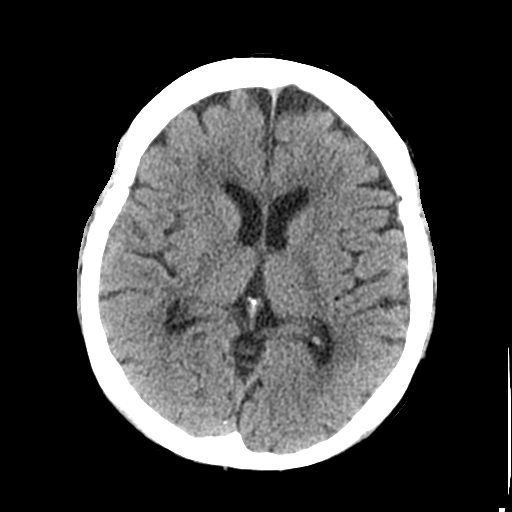
[im 15/34  bone]
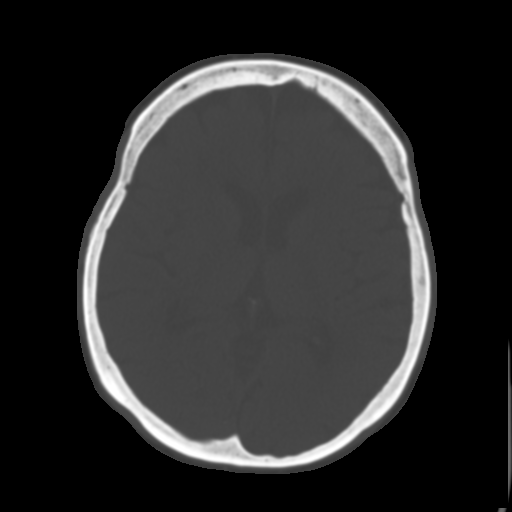
[im 19/34  brain]
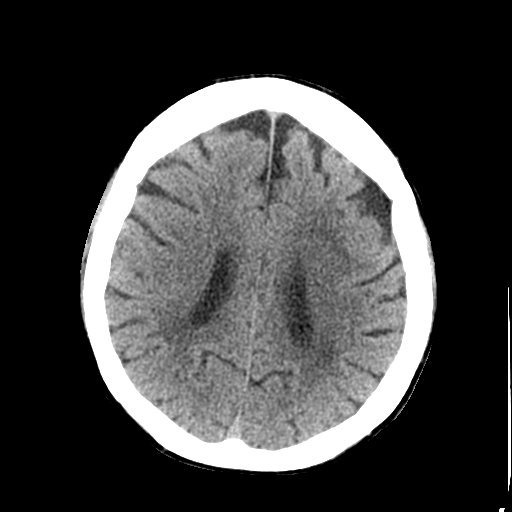
[im 22/34  brain]
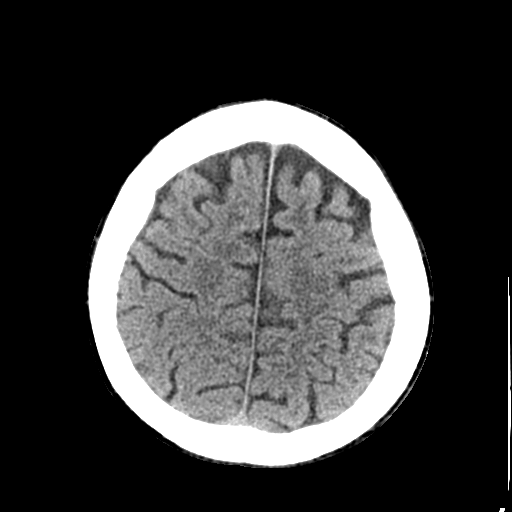
[im 26/34  brain]
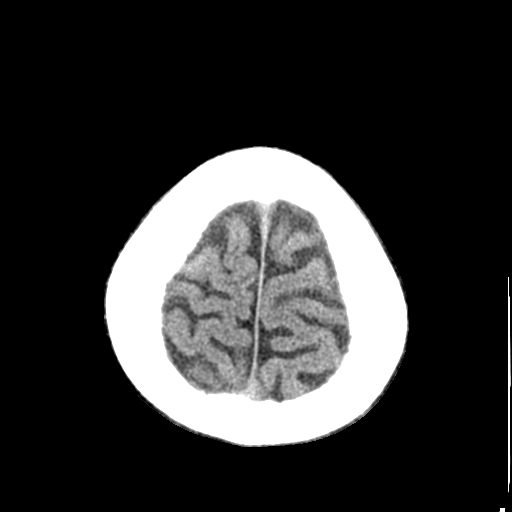
[im 28/34  brain]
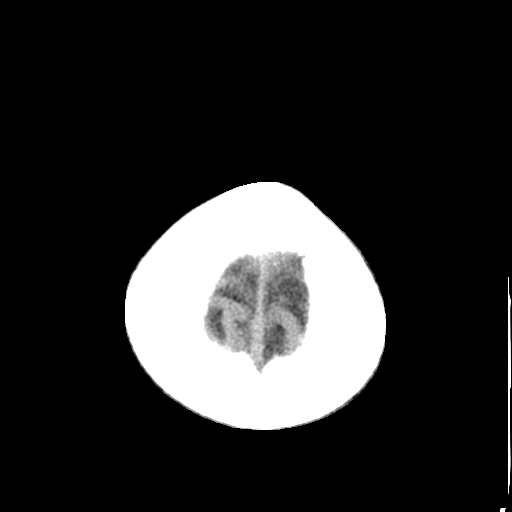
[im 28/34  bone]
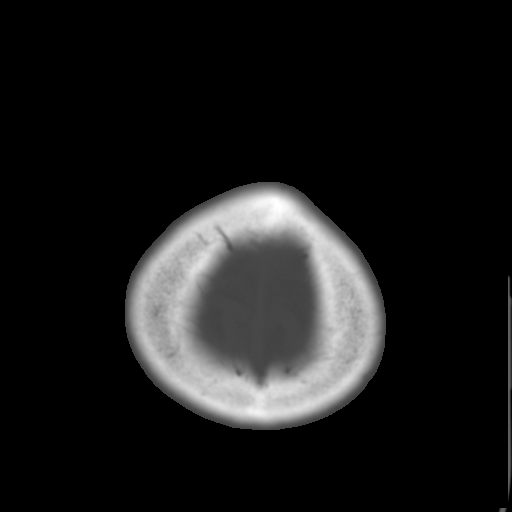
[im 31/34  brain]
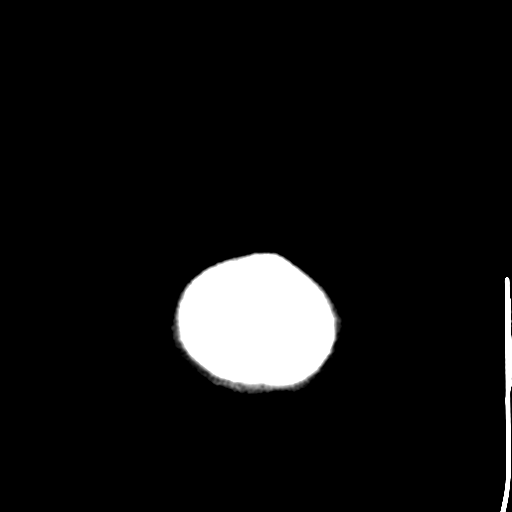

[Series 5: coronal soft tissue · coronal · 0.36mm/px · 3 of 67 slices shown]
[im 23/67  brain]
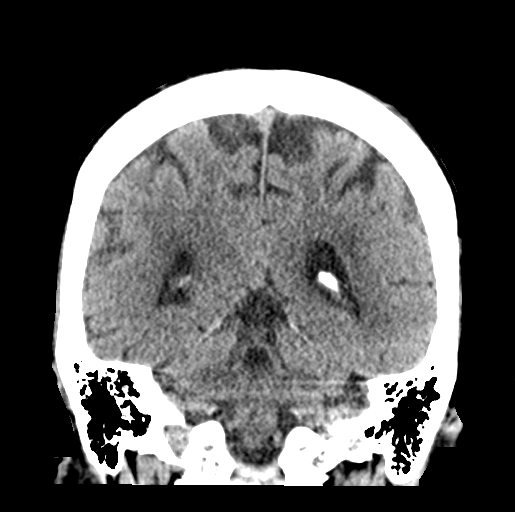
[im 30/67  brain]
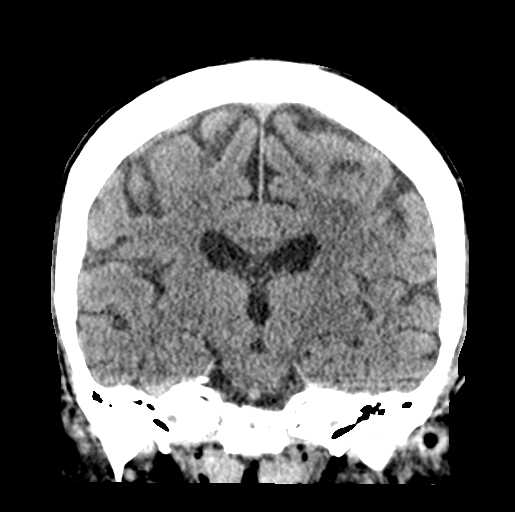
[im 37/67  brain]
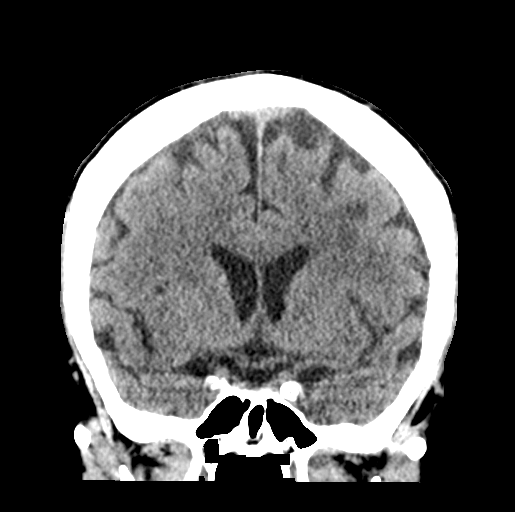

[Series 6: sagittal soft tissue · sagittal · 0.36mm/px · 3 of 63 slices shown]
[im 21/63  brain]
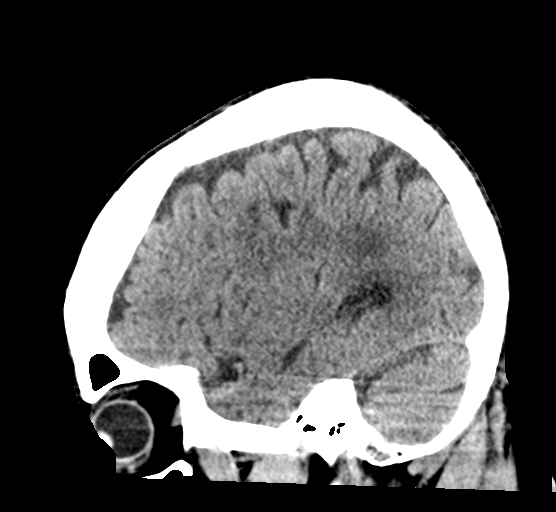
[im 32/63  brain]
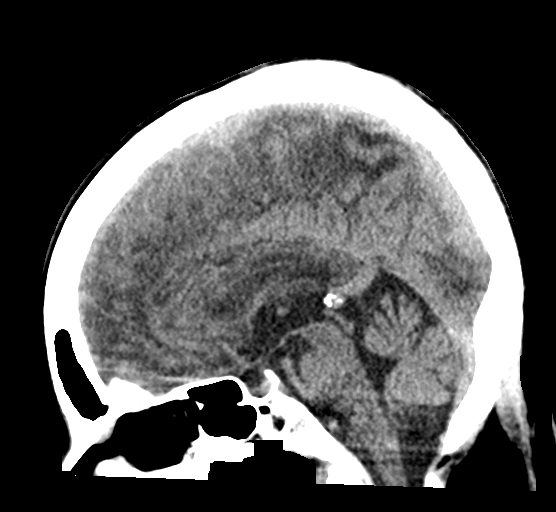
[im 42/63  brain]
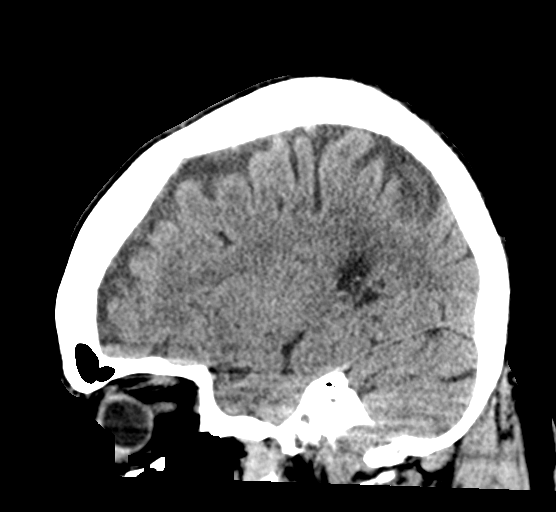

[16 of 47 positions shown; findings below may reference images not displayed]

BRAIN:
BRAIN
Patchy and confluent areas of decreased attenuation are noted
throughout the deep and periventricular white matter of the cerebral
hemispheres bilaterally, compatible with chronic microvascular
ischemic disease.

No evidence of large-territorial acute infarction. No parenchymal
hemorrhage. No mass lesion. No extra-axial collection.

No mass effect or midline shift. No hydrocephalus. Basilar cisterns
are patent.

Vascular: No hyperdense vessel. Atherosclerotic calcifications are
present within the cavernous internal carotid and vertebral
arteries.

Skull: No acute fracture or focal lesion.

Sinuses/Orbits: Paranasal sinuses and mastoid air cells are clear.
The orbits are unremarkable.

Other: None.
IMPRESSION: No acute intracranial abnormality.

## 2024-05-30 ENCOUNTER — Other Ambulatory Visit: Payer: Self-pay | Admitting: Internal Medicine

## 2024-05-30 DIAGNOSIS — E1165 Type 2 diabetes mellitus with hyperglycemia: Secondary | ICD-10-CM

## 2024-06-09 ENCOUNTER — Other Ambulatory Visit: Payer: Self-pay | Admitting: Internal Medicine

## 2024-06-09 DIAGNOSIS — E1159 Type 2 diabetes mellitus with other circulatory complications: Secondary | ICD-10-CM

## 2024-07-03 ENCOUNTER — Encounter: Payer: Self-pay | Admitting: Oncology
